# Patient Record
Sex: Female | Born: 1960 | Race: Black or African American | Hispanic: No | Marital: Single | Smoking: Current some day smoker
Health system: Southern US, Community
[De-identification: ages and names within clinical notes are randomized; demographics above are authoritative.]

## PROBLEM LIST (undated history)

## (undated) DIAGNOSIS — D649 Anemia, unspecified: Secondary | ICD-10-CM

## (undated) DIAGNOSIS — M1990 Unspecified osteoarthritis, unspecified site: Secondary | ICD-10-CM

## (undated) DIAGNOSIS — F32A Depression, unspecified: Secondary | ICD-10-CM

## (undated) DIAGNOSIS — J302 Other seasonal allergic rhinitis: Secondary | ICD-10-CM

## (undated) DIAGNOSIS — J449 Chronic obstructive pulmonary disease, unspecified: Secondary | ICD-10-CM

## (undated) DIAGNOSIS — D759 Disease of blood and blood-forming organs, unspecified: Secondary | ICD-10-CM

## (undated) DIAGNOSIS — F329 Major depressive disorder, single episode, unspecified: Secondary | ICD-10-CM

## (undated) DIAGNOSIS — D573 Sickle-cell trait: Secondary | ICD-10-CM

## (undated) DIAGNOSIS — K635 Polyp of colon: Secondary | ICD-10-CM

## (undated) DIAGNOSIS — D259 Leiomyoma of uterus, unspecified: Secondary | ICD-10-CM

## (undated) DIAGNOSIS — F419 Anxiety disorder, unspecified: Secondary | ICD-10-CM

## (undated) HISTORY — DX: Depression, unspecified: F32.A

## (undated) HISTORY — DX: Leiomyoma of uterus, unspecified: D25.9

## (undated) HISTORY — DX: Major depressive disorder, single episode, unspecified: F32.9

## (undated) HISTORY — DX: Polyp of colon: K63.5

## (undated) HISTORY — DX: Anxiety disorder, unspecified: F41.9

## (undated) HISTORY — DX: Chronic obstructive pulmonary disease, unspecified: J44.9

## (undated) HISTORY — DX: Sickle-cell trait: D57.3

## (undated) HISTORY — DX: Other seasonal allergic rhinitis: J30.2

---

## 2002-02-02 ENCOUNTER — Ambulatory Visit (HOSPITAL_COMMUNITY): Admission: RE | Admit: 2002-02-02 | Discharge: 2002-02-02 | Payer: Self-pay | Admitting: Family Medicine

## 2002-02-09 ENCOUNTER — Ambulatory Visit (HOSPITAL_COMMUNITY): Admission: RE | Admit: 2002-02-09 | Discharge: 2002-02-09 | Payer: Self-pay | Admitting: Family Medicine

## 2002-02-09 ENCOUNTER — Encounter: Payer: Self-pay | Admitting: Family Medicine

## 2002-03-26 ENCOUNTER — Encounter: Payer: Self-pay | Admitting: Emergency Medicine

## 2002-03-26 ENCOUNTER — Emergency Department (HOSPITAL_COMMUNITY): Admission: EM | Admit: 2002-03-26 | Discharge: 2002-03-26 | Payer: Self-pay | Admitting: Emergency Medicine

## 2003-03-17 ENCOUNTER — Ambulatory Visit (HOSPITAL_COMMUNITY): Admission: RE | Admit: 2003-03-17 | Discharge: 2003-03-17 | Payer: Self-pay | Admitting: Internal Medicine

## 2003-03-17 ENCOUNTER — Encounter: Payer: Self-pay | Admitting: Internal Medicine

## 2005-05-05 ENCOUNTER — Emergency Department (HOSPITAL_COMMUNITY): Admission: EM | Admit: 2005-05-05 | Discharge: 2005-05-05 | Payer: Self-pay | Admitting: Emergency Medicine

## 2005-05-07 ENCOUNTER — Ambulatory Visit: Payer: Self-pay | Admitting: *Deleted

## 2006-04-02 ENCOUNTER — Emergency Department (HOSPITAL_COMMUNITY): Admission: EM | Admit: 2006-04-02 | Discharge: 2006-04-02 | Payer: Self-pay | Admitting: Family Medicine

## 2006-04-08 ENCOUNTER — Ambulatory Visit: Payer: Self-pay | Admitting: Pulmonary Disease

## 2006-04-09 ENCOUNTER — Ambulatory Visit: Payer: Self-pay | Admitting: *Deleted

## 2006-04-14 ENCOUNTER — Encounter: Payer: Self-pay | Admitting: Pulmonary Disease

## 2006-04-29 ENCOUNTER — Ambulatory Visit: Payer: Self-pay | Admitting: Pulmonary Disease

## 2006-05-21 ENCOUNTER — Ambulatory Visit: Payer: Self-pay | Admitting: Internal Medicine

## 2006-05-28 ENCOUNTER — Ambulatory Visit: Payer: Self-pay | Admitting: Internal Medicine

## 2006-06-17 ENCOUNTER — Ambulatory Visit: Payer: Self-pay | Admitting: Internal Medicine

## 2006-06-26 ENCOUNTER — Encounter: Payer: Self-pay | Admitting: Pulmonary Disease

## 2006-07-03 ENCOUNTER — Ambulatory Visit: Payer: Self-pay | Admitting: Pulmonary Disease

## 2006-07-10 ENCOUNTER — Ambulatory Visit (HOSPITAL_COMMUNITY): Admission: RE | Admit: 2006-07-10 | Discharge: 2006-07-10 | Payer: Self-pay | Admitting: Internal Medicine

## 2006-07-10 ENCOUNTER — Encounter: Payer: Self-pay | Admitting: Internal Medicine

## 2006-07-15 ENCOUNTER — Ambulatory Visit: Payer: Self-pay | Admitting: Internal Medicine

## 2006-07-24 ENCOUNTER — Ambulatory Visit: Payer: Self-pay | Admitting: Internal Medicine

## 2006-12-06 ENCOUNTER — Emergency Department (HOSPITAL_COMMUNITY): Admission: EM | Admit: 2006-12-06 | Discharge: 2006-12-06 | Payer: Self-pay | Admitting: Emergency Medicine

## 2006-12-15 ENCOUNTER — Emergency Department (HOSPITAL_COMMUNITY): Admission: EM | Admit: 2006-12-15 | Discharge: 2006-12-15 | Payer: Self-pay | Admitting: Emergency Medicine

## 2007-03-10 ENCOUNTER — Encounter: Admission: RE | Admit: 2007-03-10 | Discharge: 2007-03-10 | Payer: Self-pay | Admitting: Internal Medicine

## 2007-04-27 ENCOUNTER — Ambulatory Visit: Payer: Self-pay | Admitting: Pulmonary Disease

## 2007-04-28 ENCOUNTER — Ambulatory Visit: Payer: Self-pay | Admitting: Cardiology

## 2007-04-28 ENCOUNTER — Ambulatory Visit: Payer: Self-pay | Admitting: Internal Medicine

## 2007-04-28 ENCOUNTER — Encounter: Payer: Self-pay | Admitting: Pulmonary Disease

## 2007-06-23 ENCOUNTER — Ambulatory Visit: Payer: Self-pay | Admitting: Pulmonary Disease

## 2007-06-29 ENCOUNTER — Ambulatory Visit: Payer: Self-pay | Admitting: Internal Medicine

## 2007-07-27 ENCOUNTER — Ambulatory Visit (HOSPITAL_COMMUNITY): Admission: RE | Admit: 2007-07-27 | Discharge: 2007-07-27 | Payer: Self-pay | Admitting: Internal Medicine

## 2007-07-31 ENCOUNTER — Encounter: Admission: RE | Admit: 2007-07-31 | Discharge: 2007-07-31 | Payer: Self-pay | Admitting: Internal Medicine

## 2007-10-06 ENCOUNTER — Encounter: Payer: Self-pay | Admitting: Pulmonary Disease

## 2008-04-05 DIAGNOSIS — J984 Other disorders of lung: Secondary | ICD-10-CM | POA: Insufficient documentation

## 2008-04-05 DIAGNOSIS — Z8719 Personal history of other diseases of the digestive system: Secondary | ICD-10-CM

## 2008-04-05 DIAGNOSIS — K219 Gastro-esophageal reflux disease without esophagitis: Secondary | ICD-10-CM | POA: Insufficient documentation

## 2008-04-05 DIAGNOSIS — K5730 Diverticulosis of large intestine without perforation or abscess without bleeding: Secondary | ICD-10-CM | POA: Insufficient documentation

## 2008-04-05 DIAGNOSIS — K589 Irritable bowel syndrome without diarrhea: Secondary | ICD-10-CM

## 2008-04-05 DIAGNOSIS — F329 Major depressive disorder, single episode, unspecified: Secondary | ICD-10-CM | POA: Insufficient documentation

## 2008-07-26 ENCOUNTER — Emergency Department (HOSPITAL_COMMUNITY): Admission: EM | Admit: 2008-07-26 | Discharge: 2008-07-26 | Payer: Self-pay | Admitting: Emergency Medicine

## 2008-08-09 ENCOUNTER — Encounter: Admission: RE | Admit: 2008-08-09 | Discharge: 2008-08-09 | Payer: Self-pay | Admitting: Internal Medicine

## 2008-10-07 ENCOUNTER — Encounter: Payer: Self-pay | Admitting: Pulmonary Disease

## 2008-12-01 ENCOUNTER — Ambulatory Visit: Payer: Self-pay | Admitting: Pulmonary Disease

## 2008-12-01 DIAGNOSIS — J449 Chronic obstructive pulmonary disease, unspecified: Secondary | ICD-10-CM

## 2008-12-01 DIAGNOSIS — J439 Emphysema, unspecified: Secondary | ICD-10-CM | POA: Insufficient documentation

## 2008-12-12 ENCOUNTER — Telehealth (INDEPENDENT_AMBULATORY_CARE_PROVIDER_SITE_OTHER): Payer: Self-pay | Admitting: *Deleted

## 2009-01-24 ENCOUNTER — Telehealth (INDEPENDENT_AMBULATORY_CARE_PROVIDER_SITE_OTHER): Payer: Self-pay | Admitting: *Deleted

## 2009-02-19 ENCOUNTER — Emergency Department (HOSPITAL_COMMUNITY): Admission: EM | Admit: 2009-02-19 | Discharge: 2009-02-19 | Payer: Self-pay | Admitting: Emergency Medicine

## 2009-09-26 ENCOUNTER — Encounter: Admission: RE | Admit: 2009-09-26 | Discharge: 2009-09-26 | Payer: Self-pay | Admitting: Internal Medicine

## 2010-05-25 ENCOUNTER — Encounter: Admission: RE | Admit: 2010-05-25 | Discharge: 2010-05-25 | Payer: Self-pay | Admitting: Internal Medicine

## 2010-09-28 ENCOUNTER — Encounter: Admission: RE | Admit: 2010-09-28 | Discharge: 2010-09-28 | Payer: Self-pay | Admitting: Internal Medicine

## 2010-10-17 ENCOUNTER — Encounter: Admission: RE | Admit: 2010-10-17 | Discharge: 2010-10-17 | Payer: Self-pay | Admitting: Internal Medicine

## 2011-03-28 LAB — BASIC METABOLIC PANEL
CO2: 26 mEq/L (ref 19–32)
GFR calc Af Amer: >60 mL/min (ref >60)
GFR calc non Af Amer: >60 mL/min (ref >60)
Glucose, Bld: 108 mg/dL — ABNORMAL HIGH (ref 70–99)
Potassium: 4.2 mEq/L (ref 3.5–5.1)
Sodium: 138 mEq/L (ref 135–145)

## 2011-03-28 LAB — DIFFERENTIAL
Lymphocytes Relative: 42 % (ref 12–46)
Lymphs Abs: 3.3 10*3/uL (ref 0.7–4.0)
Monocytes Absolute: 0.4 10*3/uL (ref 0.1–1.0)
Neutro Abs: 4 10*3/uL (ref 1.7–7.7)
Neutrophils Relative %: 51 % (ref 43–77)

## 2011-03-28 LAB — CBC
MCV: 84.5 fL (ref 78.0–100.0)
Platelets: 313 10*3/uL (ref 150–400)
RDW: 15.4 % (ref 11.5–15.5)

## 2011-04-30 NOTE — Assessment & Plan Note (Signed)
[facility]                         GASTROENTEROLOGY OFFICE NOTE   NAME:Saindon, ERCIE ELIASEN                       MRN:          [id number]  DATE:04/28/2007                            DOB:          1961/11/06    REFERRING PHYSICIAN:  Ralene Ok, M.D.   REASON FOR CONSULTATION:  Abdominal pain.   HISTORY OF PRESENT ILLNESS:  This is a 50 year old African-American  female with history of depression, drug and alcohol abuse, chronic  tobacco abuse with chronic obstructive pulmonary disease, asthma,  gastroesophageal reflux disease, chronic anemia, hyperlipidemia,  constipation predominant irritable bowel syndrome, and diverticulosis.  The patient was evaluated in July 2007 for rectal bleeding.  She  subsequently underwent complete colonoscopy July 10, 2006.  This  revealed moderately severe left-sided diverticulosis, no other  abnormalities.  Because of family history of colon cancer in a parent,  followup in five years recommended.  The patient has had multiple  complaints since that time.  Her care has been transferred from Dr.  Oliver Barre to Dr. Ludwig Clarks.  I have no referral letter or records, but  pulled some information from the electronic medical record which shows  that the patient was in the emergency room December 15, 2006 with  complaints of abdominal pain.  The workup at that time was unremarkable  including urinalysis, CBC, comprehensive metabolic panel, serum lipase,  and plain abdominal films.  She also was sent for an abdominal  ultrasound March 10, 2007 to evaluate abdominal pain and tenderness.  This was normal except for a stable hemangioma of the right lobe of the  liver.  The patient apparently saw Dr. Ludwig Clarks recently (I spoke to him  on the telephone this morning to discuss directly) and was complaining  of epigastric pain.  She has been on Prevacid.  She states she has had  the complaints for over a year, also generalized abdominal  pain and  bloating as previously documented.  She complains of constipation but  also diarrhea and bright red blood per rectum.  She has occasional  nausea, no vomiting.  Most impressive today is the fact that the patient  is crying relentlessly.  It is very difficult for her to provide a  meaningful history.  She just states, I just don't feel good.  I have a  lot on my mind.  On further questioning the patient states that she  feels cold periodically.  Also states that she is having some type of  lung surgery at [facility] next month which makes her anxious and worried.  Further questioning reveals that she is seen by mental health and is  supposed to have an appointment this week.  She is on psychotropic  medications.   ALLERGIES:  No known drug allergies.   CURRENT MEDICATIONS:  1. Alendronate 70 mg once weekly.  2. Bupropion 100 mg daily.  3. Trazodone 50 mg at night.  4. Prevacid 30 mg daily.  5. Beano.  6. Chantix.  7. Lorazepam 0.5 mg at night.  8. Naphcon eye drops.  9. Spiriva inhaler.  10.Fluoxetine 20 mg daily.  11.Multivitamin.  FAMILY HISTORY:  Father with colon cancer deceased at age 8.   SOCIAL HISTORY:  The patient is single with one son.  She lives alone.  She finished high school.  She smokes.  Uses alcohol.  History of  illicit drug use but not recently.   PHYSICAL EXAMINATION:  GENERAL:  Tearful, depressed-appearing female who  looks older than her stated age.  She is alert and oriented.  She is in  no acute distress.  VITAL SIGNS:  Blood pressure 112/68, heart rate 76, weight 149.6 pounds  (increased 12 pounds since last visit).  HEENT:  Sclerae are muddy but anicteric.  Conjunctivae are pink.  Oral  mucosa intact.  LUNGS:  Clear.  HEART:  Regular.  ABDOMEN:  Slightly obese, slightly distended without mass or hernia.  The patient complains of tenderness with minimal palpation in any  portion of her abdomen.  EXTREMITIES:  Without edema.    IMPRESSION:  1. Chronic abdominal complaints really unchanged from year previous.      I suspect irritable bowel.  Symptoms significantly affected by      anxiety and depression. Her abdominal exam is not worrisome.  2. Intermittent rectal bleeding due to known hemorrhoids.  3. Diverticulosis.  4. Gastroesophageal reflux disease on Prevacid.  5. Depression.Active.  6. Lung disease.  7. Substance abuse.   RECOMMENDATIONS:  1. Continue proton pump inhibitor.  2. CT scan of the abdomen and pelvis to screen for significant      problems. As it is very difficult to assess this patient due to her      entirely positive Review of Systems, the chronic nature of her      complaints, and her psychiatric overlay.  3. If CT negative, return to Dr. Ludwig Clarks this week to address      nongastrointestinal complaints such as breathing difficulties,      depression, feeling of coldness, etc.  Also return to mental health      this week to deal with active depression.  Again, I spoke with Dr.      Ludwig Clarks in this regard.  Sixty minutes was spent with this patient.    Wilhemina Bonito. Marina Goodell, MD  Electronically Signed   JNP/MedQ  DD: 04/28/2007  DT: 04/28/2007  Job #: [id number]   cc:   Ralene Ok, M.D.

## 2011-04-30 NOTE — Assessment & Plan Note (Signed)
[facility]                         GASTROENTEROLOGY OFFICE NOTE   NAME:[name], [name] WELLE                       MRN:          [id number]  DATE:06/29/2007                            DOB:          31-Jan-1961    HISTORY:  [name] [name] presents today for followup. She is a 50 year old  with advanced oxygen requiring COPD, asthma, chronic anemia,  gastroesophageal reflux disease, hyperlipidemia, depression, history of  drug and alcohol abuse, diverticulosis, irritable bowel syndrome. She  was evaluated in the office on Apr 28, 2007 with multiple complaints  including abdominal pain. See that dictation for details. At that time,  she was depressed and had concerns regarding her lung disease. A CT scan  of the abdomen and pelvis was performed that day and returned negative.  She continues on Prevacid for her reflux. She has visited with the  mental health folks since her last office visit and reports doing better  with less depression. She has also been seen at [facility] and is now on  chronic oxygen therapy. No new problems.   CHIEF COMPLAINTS:  Are that of bloating, occasional loose stools as well  as some intermittent abdominal discomfort. No nausea, vomiting or weight  loss.   CURRENT MEDICATIONS:  1. Lorazepam 0.5 mg at night.  2. Prevacid 30 mg daily.  3. Fluoxetine 20 mg daily.  4. Bupropion 100 mg daily.  5. Spiriva once daily.  6. Home oxygen therapy.  7. Alendronate 70 mg weekly.   PHYSICAL EXAMINATION:  Chronically  ill-appearing female in no acute  distress. She is alert and oriented. Blood pressure is 108/64, heart  rate 72, weight is 155.6 pounds.  LUNGS:  Reveal distant breath sounds.  HEART: Regular.  ABDOMEN: Obese without tenderness or mass. Good bowel sounds heard.   IMPRESSION:  1. Irritable bowel syndrome.  2. Diverticulosis.  3. Gastroesophageal reflux disease.  4. Chronic abdominal complaints related to irritable bowel.  5.  Problems with bloating likely exacerbated by lung disease.  6. Multiple general medical problems including advanced lung disease.   RECOMMENDATIONS:  1. Continue Prevacid.  2. Followup colonoscopy due in July 2012 if medically fit.  3. Resume general medical care with Dr.  Ludwig Clarks.     Wilhemina Bonito. Marina Goodell, MD  Electronically Signed    JNP/MedQ  DD: 06/29/2007  DT: 06/29/2007  Job #: [id number]   cc:   Ralene Ok, M.D.

## 2011-07-20 ENCOUNTER — Encounter: Payer: Self-pay | Admitting: Internal Medicine

## 2011-09-30 ENCOUNTER — Encounter: Payer: Self-pay | Admitting: Internal Medicine

## 2011-09-30 ENCOUNTER — Other Ambulatory Visit (HOSPITAL_COMMUNITY): Payer: Self-pay | Admitting: Internal Medicine

## 2011-09-30 DIAGNOSIS — Z1231 Encounter for screening mammogram for malignant neoplasm of breast: Secondary | ICD-10-CM

## 2011-10-08 ENCOUNTER — Ambulatory Visit (AMBULATORY_SURGERY_CENTER): Payer: PRIVATE HEALTH INSURANCE | Admitting: *Deleted

## 2011-10-08 VITALS — Ht 64.5 in | Wt 156.9 lb

## 2011-10-08 DIAGNOSIS — Z1211 Encounter for screening for malignant neoplasm of colon: Secondary | ICD-10-CM

## 2011-10-08 MED ORDER — PEG-KCL-NACL-NASULF-NA ASC-C 100 G PO SOLR: ORAL | Status: DC

## 2011-10-15 ENCOUNTER — Ambulatory Visit (HOSPITAL_COMMUNITY): Payer: PRIVATE HEALTH INSURANCE

## 2011-10-18 ENCOUNTER — Telehealth: Payer: Self-pay | Admitting: Internal Medicine

## 2011-10-18 NOTE — Telephone Encounter (Signed)
No answer, left message.   [name] [name] e

## 2011-10-21 ENCOUNTER — Telehealth: Payer: Self-pay | Admitting: Internal Medicine

## 2011-10-21 DIAGNOSIS — Z1211 Encounter for screening for malignant neoplasm of colon: Secondary | ICD-10-CM

## 2011-10-21 MED ORDER — PEG-KCL-NACL-NASULF-NA ASC-C 100 G PO SOLR: ORAL | Status: DC

## 2011-10-21 NOTE — Telephone Encounter (Signed)
Pt states that she drank 1st dose of Moviprep on Friday.  She needs another A and B packet.  New rx of Moviprep sent to her pharmacy; pharmacist had stated she would need a brand new rx. Pt notified and understanding voiced

## 2011-10-22 ENCOUNTER — Ambulatory Visit (AMBULATORY_SURGERY_CENTER): Payer: PRIVATE HEALTH INSURANCE | Admitting: Internal Medicine

## 2011-10-22 ENCOUNTER — Encounter: Payer: Self-pay | Admitting: Internal Medicine

## 2011-10-22 VITALS — BP 125/76 | HR 90 | Temp 97.1°F | Resp 20 | Ht 64.5 in | Wt 156.0 lb

## 2011-10-22 DIAGNOSIS — K5730 Diverticulosis of large intestine without perforation or abscess without bleeding: Secondary | ICD-10-CM

## 2011-10-22 DIAGNOSIS — Z800 Family history of malignant neoplasm of digestive organs: Secondary | ICD-10-CM

## 2011-10-22 DIAGNOSIS — D126 Benign neoplasm of colon, unspecified: Secondary | ICD-10-CM

## 2011-10-22 DIAGNOSIS — Z1211 Encounter for screening for malignant neoplasm of colon: Secondary | ICD-10-CM

## 2011-10-22 MED ORDER — SODIUM CHLORIDE 0.9 % IV SOLN: 500.0000 mL | INTRAVENOUS | Status: DC

## 2011-10-22 NOTE — Progress Notes (Signed)
Pt states that she is on home oxygen at 2 liters.

## 2011-10-22 NOTE — Patient Instructions (Signed)
2 POLYPS, DIVERTICULOSIS  SEE GREEN AND BLUE SHEETS FOR ADDITIONAL D/C INSTRUCTIONS  RECALL COLONOSCOPY IN 5 YEARS

## 2011-10-23 ENCOUNTER — Telehealth: Payer: Self-pay

## 2011-10-23 NOTE — Telephone Encounter (Signed)
Short and uncomplicated procedure. See her PCP for Hip pain

## 2011-10-23 NOTE — Telephone Encounter (Signed)
11:10 phone call to the pt.  She has been laying down and not in pain at this time.  Per Dr.Perry short and uncomplicated procedure.  Pt to see PCP for hip pain.  maw

## 2011-10-23 NOTE — Telephone Encounter (Signed)
Follow up Call- Patient questions:  Do you have a fever, pain , or abdominal swelling? yes Pain Score  9 *  Have you tolerated food without any problems? yes  Have you been able to return to your normal activities? no  Do you have any questions about your discharge instructions: Diet   no Medications  no Follow up visit  no  Do you have questions or concerns about your Care? yes  Pt complaint of left hip pain rating the pain 8-9 out of 10 on the pain scale.  She said it is a sharpe pain when she bends over.  Pt feels the pain is from "the way I was laying".  Pt has no prior hx of left hip or back pain per the pt.  She had CYCLOBENZAPR (flexeril) at home that was ordered by Dr. Clista Bernhardt, her medical MD prior and took that this AM around 07:30 am.  I advised her that I would make Dr. Marina Goodell aware of this and call her back with any further recommendations.  She said she understood. MAW   * If pain score is 4 or above: No action needed, pain <4.

## 2012-01-23 ENCOUNTER — Ambulatory Visit
Admission: RE | Admit: 2012-01-23 | Discharge: 2012-01-23 | Disposition: A | Discharge status: Home / Self Care | Payer: PRIVATE HEALTH INSURANCE | Source: Ambulatory Visit | Attending: *Deleted | Admitting: *Deleted

## 2012-01-23 ENCOUNTER — Other Ambulatory Visit: Payer: Self-pay | Admitting: *Deleted

## 2012-01-23 DIAGNOSIS — J449 Chronic obstructive pulmonary disease, unspecified: Secondary | ICD-10-CM

## 2012-03-31 ENCOUNTER — Other Ambulatory Visit: Payer: Self-pay | Admitting: Surgery

## 2012-04-09 ENCOUNTER — Ambulatory Visit: Payer: PRIVATE HEALTH INSURANCE

## 2012-05-07 ENCOUNTER — Ambulatory Visit: Payer: PRIVATE HEALTH INSURANCE

## 2012-05-15 ENCOUNTER — Ambulatory Visit
Admission: RE | Admit: 2012-05-15 | Discharge: 2012-05-15 | Disposition: A | Discharge status: Home / Self Care | Payer: PRIVATE HEALTH INSURANCE | Source: Ambulatory Visit | Attending: Internal Medicine | Admitting: Internal Medicine

## 2012-05-15 DIAGNOSIS — Z1231 Encounter for screening mammogram for malignant neoplasm of breast: Secondary | ICD-10-CM

## 2012-07-29 ENCOUNTER — Other Ambulatory Visit (HOSPITAL_COMMUNITY)
Admission: RE | Admit: 2012-07-29 | Discharge: 2012-07-29 | Disposition: A | Discharge status: Home / Self Care | Payer: PRIVATE HEALTH INSURANCE | Source: Ambulatory Visit | Attending: Obstetrics and Gynecology | Admitting: Obstetrics and Gynecology

## 2012-07-29 ENCOUNTER — Other Ambulatory Visit: Payer: Self-pay | Admitting: Obstetrics and Gynecology

## 2012-07-29 DIAGNOSIS — Z1151 Encounter for screening for human papillomavirus (HPV): Secondary | ICD-10-CM | POA: Insufficient documentation

## 2012-07-29 DIAGNOSIS — Z124 Encounter for screening for malignant neoplasm of cervix: Secondary | ICD-10-CM | POA: Insufficient documentation

## 2012-10-15 ENCOUNTER — Other Ambulatory Visit: Payer: Self-pay | Admitting: Internal Medicine

## 2012-10-15 DIAGNOSIS — R109 Unspecified abdominal pain: Secondary | ICD-10-CM

## 2012-10-22 ENCOUNTER — Ambulatory Visit
Admission: RE | Admit: 2012-10-22 | Discharge: 2012-10-22 | Disposition: A | Discharge status: Home / Self Care | Payer: PRIVATE HEALTH INSURANCE | Source: Ambulatory Visit | Attending: Internal Medicine | Admitting: Internal Medicine

## 2012-10-22 DIAGNOSIS — R109 Unspecified abdominal pain: Secondary | ICD-10-CM

## 2013-07-02 ENCOUNTER — Other Ambulatory Visit: Payer: Self-pay | Admitting: Internal Medicine

## 2013-07-02 DIAGNOSIS — Z1231 Encounter for screening mammogram for malignant neoplasm of breast: Secondary | ICD-10-CM

## 2013-07-22 ENCOUNTER — Ambulatory Visit
Admission: RE | Admit: 2013-07-22 | Discharge: 2013-07-22 | Disposition: A | Discharge status: Home / Self Care | Payer: PRIVATE HEALTH INSURANCE | Source: Ambulatory Visit | Attending: Internal Medicine | Admitting: Internal Medicine

## 2013-07-22 DIAGNOSIS — Z1231 Encounter for screening mammogram for malignant neoplasm of breast: Secondary | ICD-10-CM

## 2013-11-30 ENCOUNTER — Other Ambulatory Visit: Payer: Self-pay | Admitting: Internal Medicine

## 2013-11-30 DIAGNOSIS — M810 Age-related osteoporosis without current pathological fracture: Secondary | ICD-10-CM

## 2013-11-30 DIAGNOSIS — N838 Other noninflammatory disorders of ovary, fallopian tube and broad ligament: Secondary | ICD-10-CM

## 2013-12-06 ENCOUNTER — Other Ambulatory Visit: Payer: PRIVATE HEALTH INSURANCE

## 2013-12-14 ENCOUNTER — Ambulatory Visit
Admission: RE | Admit: 2013-12-14 | Discharge: 2013-12-14 | Disposition: A | Discharge status: Home / Self Care | Payer: PRIVATE HEALTH INSURANCE | Source: Ambulatory Visit | Attending: Internal Medicine | Admitting: Internal Medicine

## 2013-12-14 DIAGNOSIS — N838 Other noninflammatory disorders of ovary, fallopian tube and broad ligament: Secondary | ICD-10-CM

## 2013-12-29 ENCOUNTER — Other Ambulatory Visit: Payer: PRIVATE HEALTH INSURANCE

## 2014-01-19 ENCOUNTER — Other Ambulatory Visit: Payer: PRIVATE HEALTH INSURANCE

## 2014-01-24 ENCOUNTER — Ambulatory Visit
Admission: RE | Admit: 2014-01-24 | Discharge: 2014-01-24 | Disposition: A | Discharge status: Home / Self Care | Payer: PRIVATE HEALTH INSURANCE | Source: Ambulatory Visit | Attending: Internal Medicine | Admitting: Internal Medicine

## 2014-01-24 DIAGNOSIS — M810 Age-related osteoporosis without current pathological fracture: Secondary | ICD-10-CM

## 2014-05-04 ENCOUNTER — Other Ambulatory Visit: Payer: Self-pay | Admitting: *Deleted

## 2014-05-04 ENCOUNTER — Other Ambulatory Visit: Payer: Self-pay | Admitting: Internal Medicine

## 2014-05-04 DIAGNOSIS — M79606 Pain in leg, unspecified: Secondary | ICD-10-CM

## 2014-05-06 ENCOUNTER — Ambulatory Visit
Admission: RE | Admit: 2014-05-06 | Discharge: 2014-05-06 | Disposition: A | Discharge status: Home / Self Care | Payer: PRIVATE HEALTH INSURANCE | Source: Ambulatory Visit | Attending: Internal Medicine | Admitting: Internal Medicine

## 2014-05-06 DIAGNOSIS — M79606 Pain in leg, unspecified: Secondary | ICD-10-CM

## 2014-06-07 ENCOUNTER — Emergency Department (HOSPITAL_COMMUNITY)
Admission: EM | Admit: 2014-06-07 | Discharge: 2014-06-07 | Disposition: A | Discharge status: Home / Self Care | Payer: PRIVATE HEALTH INSURANCE | Attending: Emergency Medicine | Admitting: Emergency Medicine

## 2014-06-07 ENCOUNTER — Encounter (HOSPITAL_COMMUNITY): Payer: Self-pay | Admitting: Emergency Medicine

## 2014-06-07 ENCOUNTER — Emergency Department (HOSPITAL_COMMUNITY): Payer: PRIVATE HEALTH INSURANCE

## 2014-06-07 DIAGNOSIS — J3489 Other specified disorders of nose and nasal sinuses: Secondary | ICD-10-CM | POA: Diagnosis not present

## 2014-06-07 DIAGNOSIS — F411 Generalized anxiety disorder: Secondary | ICD-10-CM | POA: Insufficient documentation

## 2014-06-07 DIAGNOSIS — J441 Chronic obstructive pulmonary disease with (acute) exacerbation: Secondary | ICD-10-CM | POA: Diagnosis not present

## 2014-06-07 DIAGNOSIS — Z79899 Other long term (current) drug therapy: Secondary | ICD-10-CM | POA: Diagnosis not present

## 2014-06-07 DIAGNOSIS — M810 Age-related osteoporosis without current pathological fracture: Secondary | ICD-10-CM | POA: Insufficient documentation

## 2014-06-07 DIAGNOSIS — Z862 Personal history of diseases of the blood and blood-forming organs and certain disorders involving the immune mechanism: Secondary | ICD-10-CM | POA: Diagnosis not present

## 2014-06-07 DIAGNOSIS — R42 Dizziness and giddiness: Secondary | ICD-10-CM | POA: Diagnosis present

## 2014-06-07 DIAGNOSIS — F329 Major depressive disorder, single episode, unspecified: Secondary | ICD-10-CM | POA: Insufficient documentation

## 2014-06-07 DIAGNOSIS — F3289 Other specified depressive episodes: Secondary | ICD-10-CM | POA: Insufficient documentation

## 2014-06-07 DIAGNOSIS — F17200 Nicotine dependence, unspecified, uncomplicated: Secondary | ICD-10-CM | POA: Insufficient documentation

## 2014-06-07 DIAGNOSIS — R1013 Epigastric pain: Secondary | ICD-10-CM | POA: Diagnosis not present

## 2014-06-07 DIAGNOSIS — R0981 Nasal congestion: Secondary | ICD-10-CM

## 2014-06-07 LAB — CBC WITH DIFFERENTIAL/PLATELET
BASOS PCT: 0 % (ref 0–1)
Basophils Absolute: 0 10*3/uL (ref 0.0–0.1)
EOS ABS: 0.2 10*3/uL (ref 0.0–0.7)
EOS PCT: 3 % (ref 0–5)
HCT: 43.1 % (ref 36.0–46.0)
HEMOGLOBIN: 15 g/dL (ref 12.0–15.0)
LYMPHS PCT: 36 % (ref 12–46)
Lymphs Abs: 1.8 10*3/uL (ref 0.7–4.0)
MCH: 29.1 pg (ref 26.0–34.0)
MCHC: 34.8 g/dL (ref 30.0–36.0)
MCV: 83.7 fL (ref 78.0–100.0)
MONO ABS: 0.3 10*3/uL (ref 0.1–1.0)
Monocytes Relative: 6 % (ref 3–12)
NEUTROS PCT: 55 % (ref 43–77)
Neutro Abs: 2.7 10*3/uL (ref 1.7–7.7)
PLATELETS: UNDETERMINED 10*3/uL (ref 150–400)
RBC: 5.15 MIL/uL — AB (ref 3.87–5.11)
RDW: 14.1 % (ref 11.5–15.5)
WBC: 5 10*3/uL (ref 4.0–10.5)

## 2014-06-07 LAB — URINALYSIS, ROUTINE W REFLEX MICROSCOPIC
BILIRUBIN URINE: NEGATIVE
GLUCOSE, UA: NEGATIVE mg/dL
HGB URINE DIPSTICK: NEGATIVE
KETONES UR: NEGATIVE mg/dL
Leukocytes, UA: NEGATIVE
Nitrite: NEGATIVE
PROTEIN: NEGATIVE mg/dL
Specific Gravity, Urine: 1.015 (ref 1.005–1.030)
UROBILINOGEN UA: 0.2 mg/dL (ref 0.0–1.0)
pH: 5.5 (ref 5.0–8.0)

## 2014-06-07 LAB — COMPREHENSIVE METABOLIC PANEL
ALBUMIN: 3.9 g/dL (ref 3.5–5.2)
ALT: 16 U/L (ref 0–35)
AST: 18 U/L (ref 0–37)
Alkaline Phosphatase: 58 U/L (ref 39–117)
BILIRUBIN TOTAL: 0.6 mg/dL (ref 0.3–1.2)
BUN: 13 mg/dL (ref 6–23)
CHLORIDE: 106 meq/L (ref 96–112)
CO2: 27 mEq/L (ref 19–32)
CREATININE: 0.81 mg/dL (ref 0.50–1.10)
Calcium: 9.5 mg/dL (ref 8.4–10.5)
GFR calc Af Amer: >90 mL/min (ref >90)
GFR calc non Af Amer: 80 mL/min — ABNORMAL LOW (ref >90)
Glucose, Bld: 93 mg/dL (ref 70–99)
Potassium: 4.3 mEq/L (ref 3.7–5.3)
Sodium: 144 mEq/L (ref 137–147)
TOTAL PROTEIN: 7.9 g/dL (ref 6.0–8.3)

## 2014-06-07 LAB — I-STAT TROPONIN, ED: TROPONIN I, POC: 0 ng/mL (ref 0.00–0.08)

## 2014-06-07 LAB — LIPASE, BLOOD: LIPASE: 27 U/L (ref 11–59)

## 2014-06-07 LAB — I-STAT CHEM 8, ED
BUN: 14 mg/dL (ref 6–23)
CREATININE: 0.9 mg/dL (ref 0.50–1.10)
Calcium, Ion: 1.28 mmol/L — ABNORMAL HIGH (ref 1.12–1.23)
Chloride: 103 mEq/L (ref 96–112)
GLUCOSE: 88 mg/dL (ref 70–99)
HCT: 48 % — ABNORMAL HIGH (ref 36.0–46.0)
HEMOGLOBIN: 16.3 g/dL — AB (ref 12.0–15.0)
Potassium: 4 mEq/L (ref 3.7–5.3)
SODIUM: 144 meq/L (ref 137–147)
TCO2: 26 mmol/L (ref 0–100)

## 2014-06-07 MED ORDER — DIAZEPAM 5 MG/ML IJ SOLN
5.0000 mg | Freq: Once | INTRAMUSCULAR | Status: AC
  Administered 2014-06-07: 5 mg via INTRAVENOUS

## 2014-06-07 MED ORDER — MECLIZINE HCL 25 MG PO TABS
50.0000 mg | ORAL_TABLET | Freq: Once | ORAL | Status: AC
  Administered 2014-06-07: 50 mg via ORAL
  Filled 2014-06-07: qty 2

## 2014-06-07 MED ORDER — DIAZEPAM 5 MG/ML IJ SOLN
5.0000 mg | Freq: Once | INTRAMUSCULAR | Status: DC
  Filled 2014-06-07: qty 2

## 2014-06-07 MED ORDER — ALBUTEROL SULFATE (2.5 MG/3ML) 0.083% IN NEBU
5.0000 mg | INHALATION_SOLUTION | Freq: Once | RESPIRATORY_TRACT | Status: AC
  Administered 2014-06-07: 5 mg via RESPIRATORY_TRACT
  Filled 2014-06-07: qty 6

## 2014-06-07 MED ORDER — MECLIZINE HCL 25 MG PO TABS
25.0000 mg | ORAL_TABLET | Freq: Three times a day (TID) | ORAL | Status: DC | PRN
Start: 2014-06-07 — End: 2016-02-06

## 2014-06-07 MED ORDER — PSEUDOEPHEDRINE HCL 30 MG PO TABS: 30.0000 mg | ORAL_TABLET | ORAL | Status: DC | PRN

## 2014-06-07 MED ORDER — GI COCKTAIL ~~LOC~~
30.0000 mL | Freq: Once | ORAL | Status: AC
  Administered 2014-06-07: 30 mL via ORAL
  Filled 2014-06-07: qty 30

## 2014-06-07 MED ORDER — SODIUM CHLORIDE 0.9 % IV BOLUS (SEPSIS)
1000.0000 mL | Freq: Once | INTRAVENOUS | Status: AC
  Administered 2014-06-07: 1000 mL via INTRAVENOUS

## 2014-06-07 NOTE — ED Provider Notes (Signed)
Medical screening examination/treatment/procedure(s) were performed by non-physician practitioner and as supervising physician I was immediately available for consultation/collaboration.    Dot Lanes, MD 06/07/14 [phone]

## 2014-06-07 NOTE — ED Notes (Signed)
Pt c/o dizziness since last night around 930 when she got up to cook chicken. Pt is having SOB and is on 2L O2 at home. O2 97% 2L.

## 2014-06-07 NOTE — ED Notes (Signed)
Pt taken to the bathroom. No complaints at this time.

## 2014-06-07 NOTE — ED Provider Notes (Signed)
CSN: [id number]     Arrival date & time 06/07/14  1751 History   First MD Initiated Contact with Patient 06/07/14 [phone]     Chief Complaint  Patient presents with  . Dizziness  . Shortness of Breath     (Consider location/radiation/quality/duration/timing/severity/associated sxs/prior Treatment) HPI [name] [name] is a 53 y.o. female who presents to ED with multiple complaints. Pt reports weakness, dizziness, congestion, sore throat, abdominal pain, cough. States most symptoms began yesterday. States dizziness feels more like "light headed." Denies spinning sensation but states it is worsened with head movement and position changes. States cough and shorness of breath is chronic, states "its my COPD." Repots also abdominal swelling, nausea, no vomiting. Normal BM this morning. Denies taking any medications for her symptoms. States "I just feel weak and I feel like my eyeballs are rolling to the back of my head."   Past Medical History  Diagnosis Date  . Seasonal allergies   . Anxiety   . Depression   . COPD (chronic obstructive pulmonary disease)   . Osteoporosis   . Sickle cell trait    Past Surgical History  Procedure Laterality Date  . Cesarean section  1980   Family History  Problem Relation Age of Onset  . Colon cancer Father 52  . Stomach cancer Neg Hx    History  Substance Use Topics  . Smoking status: Current Every Day Smoker -- 1.00 packs/day    Types: Cigarettes  . Smokeless tobacco: Not on file  . Alcohol Use: Yes     Comment: occasional   OB History   Grav Para Term Preterm Abortions TAB SAB Ect Mult Living                 Review of Systems  Constitutional: Positive for fatigue. Negative for fever and chills.  Respiratory: Positive for cough and shortness of breath. Negative for chest tightness.   Cardiovascular: Negative for chest pain, palpitations and leg swelling.  Gastrointestinal: Positive for nausea and abdominal pain. Negative for vomiting and  diarrhea.  Genitourinary: Negative for dysuria, flank pain and pelvic pain.  Musculoskeletal: Negative for arthralgias, myalgias, neck pain and neck stiffness.  Skin: Negative for rash.  Neurological: Positive for dizziness and light-headedness. Negative for syncope, speech difficulty, weakness, numbness and headaches.  All other systems reviewed and are negative.     Allergies  Review of patient's allergies indicates no known allergies.  Home Medications   Prior to Admission medications   Medication Sig Start Date End Date Taking? Authorizing Ilynn Stauffer  buPROPion (WELLBUTRIN SR) 100 MG 12 hr tablet Take 100 mg by mouth 2 (two) times daily.      Historical Fred Hammes, MD  cyclobenzaprine (FLEXERIL) 5 MG tablet Take 5 mg by mouth as needed.      Historical Gerald Honea, MD  Fexofenadine HCl (ALLEGRA PO) Take by mouth every 6 (six) hours as needed.      Historical Rolfe Hartsell, MD  LORazepam (ATIVAN) 1 MG tablet Take 1 mg by mouth at bedtime.      Historical Kerston Landeck, MD  Multiple Vitamin (MULTIVITAMIN) tablet Take 1 tablet by mouth daily.      Historical Kellee Sittner, MD  raloxifene (EVISTA) 60 MG tablet Take 60 mg by mouth daily.      Historical Assunta Pupo, MD  tiotropium (SPIRIVA) 18 MCG inhalation capsule Place 18 mcg into inhaler and inhale daily.      Historical Joelle Roswell, MD   BP 130/89  Pulse 86  Temp(Src) 97.9 F (36.6  C) (Oral)  Resp 22  Ht 5\' 4"  (1.626 m)  Wt 138 lb (62.596 kg)  BMI 23.68 kg/m2  SpO2 98% Physical Exam  Nursing note and vitals reviewed. Constitutional: She is oriented to person, place, and time. She appears well-developed and well-nourished. No distress.  HENT:  Head: Normocephalic.  Nose: Nose normal.  Mouth/Throat: Oropharynx is clear and moist.  Nasal congestion.   Eyes: Conjunctivae and EOM are normal. Pupils are equal, round, and reactive to light.  Horizontal nystagmus present.   Neck: Neck supple.  Cardiovascular: Normal rate, regular rhythm and normal heart  sounds.   Pulmonary/Chest: Effort normal. No respiratory distress. She has wheezes. She has no rales.  End expiratory wheezes in both lung fields  Abdominal: Soft. Bowel sounds are normal. She exhibits no distension. There is no tenderness. There is no rebound.  Musculoskeletal: She exhibits no edema.  Neurological: She is alert and oriented to person, place, and time.  Skin: Skin is warm and dry.  Psychiatric: She has a normal mood and affect. Her behavior is normal.    ED Course  Procedures (including critical care time) Labs Review Labs Reviewed  CBC WITH DIFFERENTIAL - Abnormal; Notable for the following:    RBC 5.15 (*)    All other components within normal limits  COMPREHENSIVE METABOLIC PANEL - Abnormal; Notable for the following:    GFR calc non Af Amer 80 (*)    All other components within normal limits  I-STAT CHEM 8, ED - Abnormal; Notable for the following:    Calcium, Ion 1.28 (*)    Hemoglobin 16.3 (*)    HCT 48.0 (*)    All other components within normal limits  URINE CULTURE  LIPASE, BLOOD  URINALYSIS, ROUTINE W REFLEX MICROSCOPIC  I-STAT TROPOININ, ED    Imaging Review No results found.   EKG Interpretation   Date/Time:  Tuesday June 07 2014 07:51:49 EDT Ventricular Rate:  80 PR Interval:  162 QRS Duration: 97 QT Interval:  394 QTC Calculation: 454 R Axis:   74 Text Interpretation:  Sinus rhythm Anteroseptal infarct, old No  significant change since last tracing Confirmed by BEATON  MD, ROBERT  ([id number]) on 06/07/2014 8:10:02 AM      MDM   Final diagnoses:  Dizziness  Epigastric pain  Nasal congestion   Pt with dizziness, nausea, abdomina pain, congestion, sore throat, weakness. Will get labs, CXR, abd xray, will try IV fluids, meclizine. VS normal. No focal neuro deficits.    11:34 AM Pt continues to be dizzy after meclizine. She was able to get up and walk but complaining of feeling dizzy. She denies headache, no sensation of room spinning.  Labs all normal. UA normal. Pt received 1L of fluids. Will try valium for her symptoms. Pt was also able to eat a whole Sandwich and drink water with no difficulty.   12:38 PM Pt feeling better. Ambulated with no dizziness. Pt wants to be discharged home. Will d/c home with pcp follow up. Low suspicion at this time of any acute posterior circulation stroke or cardiac cause of her dizziness given complete resolution of symptoms. Normal VS. Negative ecg, trop, labs.   Filed Vitals:   06/07/14 1130 06/07/14 1149 06/07/14 1200 06/07/14 1243  BP: 110/55 115/78 104/70 104/69  Pulse: 71 77 90 83  Temp:    97.9 F (36.6 C)  TempSrc:    Oral  Resp:  18  20  Height:      Weight:  SpO2: 100% 100% 100% 100%       Renold Genta, PA-C 06/07/14 1250

## 2014-06-07 NOTE — ED Notes (Signed)
Pt brought back to room via wheelchair; Dietrich Pates, RN and Judson Roch, RN present in gown; gown placed on stretcher for pt to undress and place on

## 2014-06-07 NOTE — ED Notes (Signed)
Pt provided [place] sandwich and sprite.

## 2014-06-07 NOTE — Discharge Instructions (Signed)
Take sudafed for congestion as prescribed. Take meclizine for dizziness as needed. Follow up with primary care doctor.   Dizziness Dizziness is a common problem. It is a feeling of unsteadiness or light-headedness. You may feel like you are about to faint. Dizziness can lead to injury if you stumble or fall. A person of any age group can suffer from dizziness, but dizziness is more common in older adults. CAUSES  Dizziness can be caused by many different things, including:  Middle ear problems.  Standing for too long.  Infections.  An allergic reaction.  Aging.  An emotional response to something, such as the sight of blood.  Side effects of medicines.  Tiredness.  Problems with circulation or blood pressure.  Excessive use of alcohol or medicines, or illegal drug use.  Breathing too fast (hyperventilation).  An irregular heart rhythm (arrhythmia).  A low red blood cell count (anemia).  Pregnancy.  Vomiting, diarrhea, fever, or other illnesses that cause body fluid loss (dehydration).  Diseases or conditions such as Parkinson's disease, high blood pressure (hypertension), diabetes, and thyroid problems.  Exposure to extreme heat. DIAGNOSIS  Your health care provider will ask about your symptoms, perform a physical exam, and perform an electrocardiogram (ECG) to record the electrical activity of your heart. Your health care provider may also perform other heart or blood tests to determine the cause of your dizziness. These may include:  Transthoracic echocardiogram (TTE). During echocardiography, sound waves are used to evaluate how blood flows through your heart.  Transesophageal echocardiogram (TEE).  Cardiac monitoring. This allows your health care provider to monitor your heart rate and rhythm in real time.  Holter monitor. This is a portable device that records your heartbeat and can help diagnose heart arrhythmias. It allows your health care provider to track  your heart activity for several days if needed.  Stress tests by exercise or by giving medicine that makes the heart beat faster. TREATMENT  Treatment of dizziness depends on the cause of your symptoms and can vary greatly. HOME CARE INSTRUCTIONS   Drink enough fluids to keep your urine clear or pale yellow. This is especially important in very hot weather. In older adults, it is also important in cold weather.  Take your medicine exactly as directed if your dizziness is caused by medicines. When taking blood pressure medicines, it is especially important to get up slowly.  Rise slowly from chairs and steady yourself until you feel okay.  In the morning, first sit up on the side of the bed. When you feel okay, stand slowly while holding onto something until you know your balance is fine.  Move your legs often if you need to stand in one place for a long time. Tighten and relax your muscles in your legs while standing.  Have someone stay with you for 1-2 days if dizziness continues to be a problem. Do this until you feel you are well enough to stay alone. Have the person call your health care provider if he or she notices changes in you that are concerning.  Do not drive or use heavy machinery if you feel dizzy.  Do not drink alcohol. SEEK IMMEDIATE MEDICAL CARE IF:   Your dizziness or light-headedness gets worse.  You feel nauseous or vomit.  You have problems talking, walking, or using your arms, hands, or legs.  You feel weak.  You are not thinking clearly or you have trouble forming sentences. It may take a friend or family member to notice  this.  You have chest pain, abdominal pain, shortness of breath, or sweating.  Your vision changes.  You notice any bleeding.  You have side effects from medicine that seems to be getting worse rather than better. MAKE SURE YOU:   Understand these instructions.  Will watch your condition.  Will get help right away if you are not  doing well or get worse. Document Released: 05/28/2001 Document Revised: 12/07/2013 Document Reviewed: 06/21/2011 [facility] Patient Information 2015 [place], [place]. This information is not intended to replace advice given to you by your health care provider. Make sure you discuss any questions you have with your health care provider.

## 2014-06-08 LAB — URINE CULTURE
COLONY COUNT: NO GROWTH
CULTURE: NO GROWTH

## 2014-07-21 ENCOUNTER — Other Ambulatory Visit: Payer: Self-pay

## 2014-07-21 DIAGNOSIS — Z1231 Encounter for screening mammogram for malignant neoplasm of breast: Secondary | ICD-10-CM

## 2014-08-03 ENCOUNTER — Ambulatory Visit
Admission: RE | Admit: 2014-08-03 | Discharge: 2014-08-03 | Disposition: A | Discharge status: Home / Self Care | Payer: PRIVATE HEALTH INSURANCE | Source: Ambulatory Visit

## 2014-08-03 DIAGNOSIS — Z1231 Encounter for screening mammogram for malignant neoplasm of breast: Secondary | ICD-10-CM

## 2015-07-21 ENCOUNTER — Other Ambulatory Visit: Payer: Self-pay

## 2015-07-21 DIAGNOSIS — Z1231 Encounter for screening mammogram for malignant neoplasm of breast: Secondary | ICD-10-CM

## 2015-07-24 ENCOUNTER — Other Ambulatory Visit: Payer: Self-pay | Admitting: Internal Medicine

## 2015-07-24 DIAGNOSIS — N950 Postmenopausal bleeding: Secondary | ICD-10-CM

## 2015-07-25 ENCOUNTER — Other Ambulatory Visit: Payer: Medicaid Other

## 2015-07-28 ENCOUNTER — Other Ambulatory Visit: Payer: Medicaid Other

## 2015-08-14 ENCOUNTER — Ambulatory Visit
Admission: RE | Admit: 2015-08-14 | Discharge: 2015-08-14 | Disposition: A | Discharge status: Home / Self Care | Payer: Medicare Other | Source: Ambulatory Visit | Attending: Internal Medicine | Admitting: Internal Medicine

## 2015-08-14 ENCOUNTER — Ambulatory Visit
Admission: RE | Admit: 2015-08-14 | Discharge: 2015-08-14 | Disposition: A | Discharge status: Home / Self Care | Payer: Medicare Other | Source: Ambulatory Visit

## 2015-08-14 DIAGNOSIS — N950 Postmenopausal bleeding: Secondary | ICD-10-CM

## 2015-08-14 DIAGNOSIS — Z1231 Encounter for screening mammogram for malignant neoplasm of breast: Secondary | ICD-10-CM

## 2016-02-06 ENCOUNTER — Ambulatory Visit (INDEPENDENT_AMBULATORY_CARE_PROVIDER_SITE_OTHER): Payer: Medicare Other | Admitting: Pulmonary Disease

## 2016-02-06 ENCOUNTER — Encounter: Payer: Self-pay | Admitting: Pulmonary Disease

## 2016-02-06 VITALS — BP 102/68 | HR 88 | Ht 64.5 in | Wt 133.0 lb

## 2016-02-06 DIAGNOSIS — J431 Panlobular emphysema: Secondary | ICD-10-CM

## 2016-02-06 DIAGNOSIS — J439 Emphysema, unspecified: Secondary | ICD-10-CM | POA: Insufficient documentation

## 2016-02-06 DIAGNOSIS — Z720 Tobacco use: Secondary | ICD-10-CM | POA: Diagnosis not present

## 2016-02-06 DIAGNOSIS — F17210 Nicotine dependence, cigarettes, uncomplicated: Secondary | ICD-10-CM | POA: Insufficient documentation

## 2016-02-06 MED ORDER — ALBUTEROL SULFATE (2.5 MG/3ML) 0.083% IN NEBU: 2.5000 mg | INHALATION_SOLUTION | Freq: Four times a day (QID) | RESPIRATORY_TRACT | Status: AC | PRN

## 2016-02-06 NOTE — Assessment & Plan Note (Signed)
She has severe COPD.  Currently her symptoms are fairly well controlled but she does have some dyspnea with moderate exertion. She does not have frequent exacerbations.   -O2 therapy: Ambulate on room air and measure O2 saturation today -Immunizations: update next visit -Tobacco use: Counseled to quit at length today -Exercise: Encouraged regular exercise -Bronchodilator therapy: Trial of Stiolto instead of Spiriva -Exacerbation prevention: ___

## 2016-02-06 NOTE — Patient Instructions (Signed)
Try taking Stiolto instead of the Spiriva daily, let me know if it is better than the Spiriva Stop smoking We will refer you to the low-dose lung cancer screening program We will see you back in 2-3 months or sooner if needed

## 2016-02-06 NOTE — Progress Notes (Signed)
Subjective:    Patient ID: [name] [name], female    DOB: 1961/08/20, 55 y.o.   MRN: [id number]  HPI Chief Complaint  Patient presents with  . Advice Only    self referral- being treated for COPD at [facility], transferring care here d/t closer location.  Pt has 02 (2lpm) but seldom wears it.  DME: Mechele Dawley has COPD and previously followed with D.r Mechele Claude Kussin at [facility] for the same.  She was diagnosed in 2006 or 2007.  She was treated with Anoro in 2015 and it made her feel more dyspneic.   She says that she was diagnosed with asthma as a teenager and has smoked since then.  She currently smoked 1/2 packs per day and has smoked ever since her teenage years execept one year when she quit with Chantix.  However she had depression after quitting smoking for a year so she staretd back.  She ocntinues to smoke every day since then.  She has never been hospitalized for her COPD fortunately.    She has not been experiencing much shortness of breath lately.  She will get some dyspnea with exertion.  She will stop if she ever climbs more than 1-2 flights of stairs.  She can run a vacuum cleaner.    She coughs every day, but it is typically dry.  She has been experiencing more sinus congestion lately because of the pollen.  She has allergic rhinitis.    Past Medical History  Diagnosis Date  . Seasonal allergies   . Anxiety   . Depression   . COPD (chronic obstructive pulmonary disease) ([place])   . Osteoporosis   . Sickle cell trait (HCC)      Family History  Problem Relation Age of Onset  . Colon cancer Father 23  . Stomach cancer Neg Hx   . Throat cancer Brother   . Heart disease Mother      Social History   Social History  . Marital Status: Single    Spouse Name: N/A  . Number of Children: N/A  . Years of Education: N/A   Occupational History  . Not on file.   Social History Main Topics  . Smoking status: Current Every Day Smoker -- 1.00 packs/day for 40 years   Types: Cigarettes  . Smokeless tobacco: Never Used     Comment: down to .5ppd 02/06/2016  . Alcohol Use: 0.0 oz/week    0 Standard drinks or equivalent per week     Comment: occasional  . Drug Use: No  . Sexual Activity: Not on file   Other Topics Concern  . Not on file   Social History Narrative     No Known Allergies   Outpatient Prescriptions Prior to Visit  Medication Sig Dispense Refill  . albuterol (PROVENTIL HFA) 108 (90 BASE) MCG/ACT inhaler Inhale 1 puff into the lungs every 6 (six) hours as needed for wheezing or shortness of breath.     Marland Kitchen buPROPion (WELLBUTRIN SR) 100 MG 12 hr tablet Take 100 mg by mouth 2 (two) times daily.      Marland Kitchen doxepin (SINEQUAN) 10 MG capsule Take 10 mg by mouth at bedtime as needed (itch). Reported on 02/06/2016    . Multiple Vitamin (MULTIVITAMIN) tablet Take 1 tablet by mouth daily.      . pseudoephedrine (SUDAFED) 30 MG tablet Take 1 tablet (30 mg total) by mouth every 4 (four) hours as needed for congestion. 30 tablet 0  .  raloxifene (EVISTA) 60 MG tablet Take 60 mg by mouth daily.      Marland Kitchen tiotropium (SPIRIVA) 18 MCG inhalation capsule Place 18 mcg into inhaler and inhale daily.      . fexofenadine (ALLEGRA) 180 MG tablet Take 180 mg by mouth daily as needed for allergies. Reported on 02/06/2016    . meclizine (ANTIVERT) 25 MG tablet Take 1 tablet (25 mg total) by mouth 3 (three) times daily as needed. (Patient not taking: Reported on 02/06/2016) 30 tablet 0   Facility-Administered Medications Prior to Visit  Medication Dose Route Frequency Provider Last Rate Last Dose  . 0.9 %  sodium chloride infusion  500 mL Intravenous Continuous Irene Shipper, MD            Review of Systems  Constitutional: Positive for unexpected weight change. Negative for fever.  HENT: Positive for congestion. Negative for dental problem, ear pain, nosebleeds, postnasal drip, rhinorrhea, sinus pressure, sneezing, sore throat and trouble swallowing.   Eyes: Negative for  redness and itching.  Respiratory: Positive for cough, chest tightness and shortness of breath. Negative for wheezing.   Cardiovascular: Negative for palpitations and leg swelling.  Gastrointestinal: Negative for nausea and vomiting.  Genitourinary: Negative for dysuria.  Musculoskeletal: Negative for joint swelling.  Skin: Negative for rash.  Neurological: Negative for headaches.  Hematological: Does not bruise/bleed easily.  Psychiatric/Behavioral: Negative for dysphoric mood. The patient is not nervous/anxious.        Objective:   Physical Exam  Filed Vitals:   02/06/16 1530  BP: 102/68  Pulse: 88  Height: 5' 4.5" (1.638 m)  Weight: 133 lb (60.328 kg)  SpO2: 93%  RA  Gen: well appearing, no acute distress HENT: NCAT, OP clear, neck supple without masses Eyes: PERRL, EOMi Lymph: no cervical lymphadenopathy PULM: CTA B CV: RRR, no mgr, no JVD GI: BS+, soft, nontender, no hsm Derm: no rash or skin breakdown MSK: normal bulk and tone Neuro: A&Ox4, CN II-XII intact, strength 5/5 in all 4 extremities Psyche: normal mood and affect  2016 pulmonary function testing from [facility] showed FEV1 of 870 mL (37% predicted) consistent with severe airflow obstruction 2015 CT scan from [facility] showed severe panlobular emphysema       Assessment & Plan:  Tobacco abuse Unfortunately she continues to smoke. She has smoked one half pack of cigarettes daily for the last 40 years. I believe that she qualifies for low-dose lung cancer screening as I think she smoked more than one half pack of cigarettes when she was younger.  I will refer her to the low-dose lung cancer screening program. I have also suggested that she attend one of our smoking cessation classes. I offered medical therapy (Chantix) but she is not interested right now.  COPD with emphysema ([place]) She has severe COPD.  Currently her symptoms are fairly well controlled but she does have some dyspnea with moderate exertion. She  does not have frequent exacerbations.   -O2 therapy: Ambulate on room air and measure O2 saturation today -Immunizations: update next visit -Tobacco use: Counseled to quit at length today -Exercise: Encouraged regular exercise -Bronchodilator therapy: Trial of Stiolto instead of Spiriva -Exacerbation prevention: ___       Current outpatient prescriptions:  .  albuterol (PROVENTIL HFA) 108 (90 BASE) MCG/ACT inhaler, Inhale 1 puff into the lungs every 6 (six) hours as needed for wheezing or shortness of breath. , Disp: , Rfl:  .  buPROPion (WELLBUTRIN SR) 100 MG 12 hr tablet, Take  100 mg by mouth 2 (two) times daily.  , Disp: , Rfl:  .  doxepin (SINEQUAN) 10 MG capsule, Take 10 mg by mouth at bedtime as needed (itch). Reported on 02/06/2016, Disp: , Rfl:  .  Multiple Vitamin (MULTIVITAMIN) tablet, Take 1 tablet by mouth daily.  , Disp: , Rfl:  .  pseudoephedrine (SUDAFED) 30 MG tablet, Take 1 tablet (30 mg total) by mouth every 4 (four) hours as needed for congestion., Disp: 30 tablet, Rfl: 0 .  raloxifene (EVISTA) 60 MG tablet, Take 60 mg by mouth daily.  , Disp: , Rfl:  .  tiotropium (SPIRIVA) 18 MCG inhalation capsule, Place 18 mcg into inhaler and inhale daily.  , Disp: , Rfl:   Current facility-administered medications:  .  0.9 %  sodium chloride infusion, 500 mL, Intravenous, Continuous, Irene Shipper, MD

## 2016-02-06 NOTE — Assessment & Plan Note (Signed)
Unfortunately she continues to smoke. She has smoked one half pack of cigarettes daily for the last 40 years. I believe that she qualifies for low-dose lung cancer screening as I think she smoked more than one half pack of cigarettes when she was younger.  I will refer her to the low-dose lung cancer screening program. I have also suggested that she attend one of our smoking cessation classes. I offered medical therapy (Chantix) but she is not interested right now.

## 2016-02-06 NOTE — Addendum Note (Signed)
Addended by: Len Blalock on: 02/06/2016 04:24 PM   Modules accepted: Orders

## 2016-02-13 ENCOUNTER — Telehealth: Payer: Self-pay | Admitting: Pulmonary Disease

## 2016-02-13 NOTE — Telephone Encounter (Signed)
LMTCB

## 2016-02-13 NOTE — Telephone Encounter (Signed)
Spoke with the pt  She states that since she started taking Stiolto she has had diff with urinating and hoarseness  She states that she feels her breathing was doing better with taking Spiriva  Please advise thanks!

## 2016-02-13 NOTE — Telephone Encounter (Signed)
LMOM TCB x1  From 2.21.17 w/ BQ: Patient Instructions       Try taking Stiolto instead of the Spiriva daily, let me know if it is better than the Spiriva Stop smoking We will refer you to the low-dose lung cancer screening program We will see you back in 2-3 months or sooner if needed

## 2016-02-13 NOTE — Telephone Encounter (Signed)
Go back to Spiriva

## 2016-02-13 NOTE — Telephone Encounter (Signed)
Patient Returned call [phone]

## 2016-02-15 NOTE — Telephone Encounter (Signed)
Spoke with pt. She is aware to go back on Spiriva. No prescription is needed at this time. Nothing further was needed.

## 2016-03-07 ENCOUNTER — Telehealth: Payer: Self-pay | Admitting: Pulmonary Disease

## 2016-03-07 NOTE — Telephone Encounter (Signed)
Called patient to discuss the lung cancer screening program/questionnaire. Pt declined at this time and reports she has a lot going on. She wants to hold off on this and reports will let us know when she decides she wants to do the program. Will inform referring provider Dr. Lake Bells as an Juluis Rainier.

## 2016-03-07 NOTE — Telephone Encounter (Signed)
OK 

## 2016-04-09 ENCOUNTER — Other Ambulatory Visit: Payer: Self-pay | Admitting: Internal Medicine

## 2016-04-09 DIAGNOSIS — M8580 Other specified disorders of bone density and structure, unspecified site: Secondary | ICD-10-CM

## 2016-04-25 ENCOUNTER — Ambulatory Visit
Admission: RE | Admit: 2016-04-25 | Discharge: 2016-04-25 | Disposition: A | Discharge status: Home / Self Care | Payer: Medicare Other | Source: Ambulatory Visit | Attending: Internal Medicine | Admitting: Internal Medicine

## 2016-04-25 DIAGNOSIS — M8580 Other specified disorders of bone density and structure, unspecified site: Secondary | ICD-10-CM

## 2016-05-09 ENCOUNTER — Encounter: Payer: Self-pay | Admitting: Pulmonary Disease

## 2016-05-09 ENCOUNTER — Ambulatory Visit (INDEPENDENT_AMBULATORY_CARE_PROVIDER_SITE_OTHER): Payer: Medicare Other | Admitting: Pulmonary Disease

## 2016-05-09 ENCOUNTER — Other Ambulatory Visit: Payer: Self-pay | Admitting: Acute Care

## 2016-05-09 VITALS — BP 128/82 | HR 68 | Ht 64.5 in | Wt 134.0 lb

## 2016-05-09 DIAGNOSIS — Z720 Tobacco use: Secondary | ICD-10-CM

## 2016-05-09 DIAGNOSIS — J431 Panlobular emphysema: Secondary | ICD-10-CM | POA: Diagnosis not present

## 2016-05-09 DIAGNOSIS — F17210 Nicotine dependence, cigarettes, uncomplicated: Principal | ICD-10-CM

## 2016-05-09 MED ORDER — UMECLIDINIUM-VILANTEROL 62.5-25 MCG/INH IN AEPB: 1.0000 | INHALATION_SPRAY | Freq: Every day | RESPIRATORY_TRACT | Status: DC

## 2016-05-09 NOTE — Patient Instructions (Signed)
Stop smoking Take the Anoro for 1 week instead of the Spiriva, call me to let me know how it works Follow up with [place] in 6 months.

## 2016-05-09 NOTE — Progress Notes (Signed)
Subjective:    Patient ID: [name] [name], female    DOB: 03-Mar-1961, 55 y.o.   MRN: [id number]  Synopsis: First referred in 2017 for evaluation of COPD. 2016 pulmonary function testing from [facility] showed FEV1 of 870 mL (37% predicted) consistent with severe airflow obstruction 2015 CT scan from [facility] showed severe panlobular emphysema She is prescribed 2 L O2 qHS and she sometimes uses a portable tank. She continues to smoke cigarettes now.  She quit smoking for one year in 2013 with Chantix.  She tried taking it again when she started back but it didn't help. She smokes 1/2 ppd as of 2017 but she thinks she smoked more.  She started smoking at age 23.    HPI Chief Complaint  Patient presents with  . Follow-up    Pt c/o occasional cough with white/yellow mucus, some SOB and wheeze when weather is hot/humid. Pt has also noticed recent hoarseness. Pt denies CP/tightness.    [name] says that her breathing is doing OK.  She said that she had a hard time with the [place].  She felt more short of breath on it than she had to go back to Spiriva.  She said she had a hard time feeling like she was getting enough.  She is doing OK on Spiriva. HOwever the hot weather still makes her more short of breath.   She has not had an exacerbation of her COPD since the last visit.   She still uses oxygen at night.   Past Medical History  Diagnosis Date  . Seasonal allergies   . Anxiety   . Depression   . COPD (chronic obstructive pulmonary disease) ([place])   . Osteoporosis   . Sickle cell trait ([place])       Review of Systems  Constitutional: Negative for fever, chills and fatigue.  HENT: Negative for rhinorrhea, sinus pressure and sneezing.   Respiratory: Negative for cough, wheezing and stridor.   Cardiovascular: Negative for chest pain, palpitations and leg swelling.       Objective:   Physical Exam Filed Vitals:   05/09/16 0945  BP: 128/82  Pulse: 68  Height: 5' 4.5" (1.638 m)  Weight: 134 lb  (60.782 kg)  SpO2: 93%   RA  Gen: well appearing HENT: OP clear, TM's clear, neck supple PULM: poor air movement, no wheezing CV: RRR, no mgr, trace edema GI: BS+, soft, nontender Derm: no cyanosis or rash Psyche: normal mood and affect        Assessment & Plan:  COPD with emphysema ([place]) This has been a stable interval for [name] in that she has not had an exacerbation. However, she remains symptomatic. I do think she would benefit from the addition of a long-acting beta agonist, unfortunately she was unable to tolerate Stiolto.  Plan: Trial of Anoro instead of Spiriva If Anoro is effective then we will change over to that medicine Quit smoking F/U 6 months  Tobacco abuse She continues to smoke one half pack of cigarettes daily. She tells me the past she thinks she smoked more than this. She has smoked for 40 years.  Plan: She was counseled to quit smoking today, smoking cessation classes and information were provided I don't have another medicine to recommend as she has failed Chantix and she is currently taking Wellbutrin Referral to lung cancer screening program     Current outpatient prescriptions:  .  albuterol (PROVENTIL HFA) 108 (90 BASE) MCG/ACT inhaler, Inhale 1 puff into the  lungs every 6 (six) hours as needed for wheezing or shortness of breath. , Disp: , Rfl:  .  albuterol (PROVENTIL) (2.5 MG/3ML) 0.083% nebulizer solution, Take 3 mLs (2.5 mg total) by nebulization every 6 (six) hours as needed for wheezing or shortness of breath., Disp: 360 mL, Rfl: 12 .  buPROPion (WELLBUTRIN SR) 100 MG 12 hr tablet, Take 100 mg by mouth 2 (two) times daily.  , Disp: , Rfl:  .  doxepin (SINEQUAN) 10 MG capsule, Take 10 mg by mouth at bedtime as needed (itch). Reported on 02/06/2016, Disp: , Rfl:  .  Multiple Vitamin (MULTIVITAMIN) tablet, Take 1 tablet by mouth daily.  , Disp: , Rfl:  .  pseudoephedrine (SUDAFED) 30 MG tablet, Take 1 tablet (30 mg total) by mouth every  4 (four) hours as needed for congestion., Disp: 30 tablet, Rfl: 0 .  raloxifene (EVISTA) 60 MG tablet, Take 60 mg by mouth daily.  , Disp: , Rfl:  .  tiotropium (SPIRIVA) 18 MCG inhalation capsule, Place 18 mcg into inhaler and inhale daily.  , Disp: , Rfl:   Current facility-administered medications:  .  0.9 %  sodium chloride infusion, 500 mL, Intravenous, Continuous, Irene Shipper, MD

## 2016-05-09 NOTE — Assessment & Plan Note (Signed)
This has been a stable interval for [name] [name] in that she has not had an exacerbation. However, she remains symptomatic. I do think she would benefit from the addition of a long-acting beta agonist, unfortunately she was unable to tolerate Stiolto.  Plan: Trial of Anoro instead of Spiriva If Anoro is effective then we will change over to that medicine Quit smoking F/U 6 months

## 2016-05-09 NOTE — Assessment & Plan Note (Signed)
She continues to smoke one half pack of cigarettes daily. She tells me the past she thinks she smoked more than this. She has smoked for 40 years.  Plan: She was counseled to quit smoking today, smoking cessation classes and information were provided I don't have another medicine to recommend as she has failed Chantix and she is currently taking Wellbutrin Referral to lung cancer screening program

## 2016-05-16 ENCOUNTER — Telehealth: Payer: Self-pay | Admitting: Pulmonary Disease

## 2016-05-16 NOTE — Telephone Encounter (Signed)
Spoke with the pt  She was seen on 05/09/16 and given the following instructions:   Stop smoking Take the Anoro for 1 week instead of the Spiriva, call me to let me know how it works Follow up with [place] in 6 months  First she stated that she could not tell any difference in her breathing since switching from spiriva to anoro Also, since she started taking med she is having increased urinary pain- I urged her to call her PCP about this since she states it has been and ongoing issue  Finally, she stated that the Anoro has helped her breathing improve   Do you want [place] to send rx?  Please advise thanks!

## 2016-05-17 NOTE — Telephone Encounter (Signed)
lmtcb x1 for pt. 

## 2016-05-17 NOTE — Telephone Encounter (Signed)
OK to switch to Anoro, please Rx See PCP for dysuria

## 2016-05-20 ENCOUNTER — Telehealth: Payer: Self-pay | Admitting: Pulmonary Disease

## 2016-05-20 MED ORDER — UMECLIDINIUM-VILANTEROL 62.5-25 MCG/INH IN AEPB: 1.0000 | INHALATION_SPRAY | Freq: Every day | RESPIRATORY_TRACT | Status: DC

## 2016-05-20 NOTE — Telephone Encounter (Signed)
anoro sent to pharmacy- see 05/20/16 phone note.

## 2016-05-20 NOTE — Telephone Encounter (Signed)
Attempted to contact pt. Line was busy. Will try back. 

## 2016-05-20 NOTE — Telephone Encounter (Signed)
Pt calling to check the status of rx, please advise.[name] [name]

## 2016-05-20 NOTE — Telephone Encounter (Signed)
rx sent to preferred pharmacy.  Pt aware.  Nothing further needed.  

## 2016-05-27 ENCOUNTER — Encounter: Payer: Self-pay | Admitting: Acute Care

## 2016-05-27 ENCOUNTER — Ambulatory Visit (INDEPENDENT_AMBULATORY_CARE_PROVIDER_SITE_OTHER): Payer: Medicare Other | Admitting: Acute Care

## 2016-05-27 ENCOUNTER — Ambulatory Visit (INDEPENDENT_AMBULATORY_CARE_PROVIDER_SITE_OTHER)
Admission: RE | Admit: 2016-05-27 | Discharge: 2016-05-27 | Disposition: A | Discharge status: Home / Self Care | Payer: Medicare Other | Source: Ambulatory Visit | Attending: Acute Care | Admitting: Acute Care

## 2016-05-27 DIAGNOSIS — F17210 Nicotine dependence, cigarettes, uncomplicated: Secondary | ICD-10-CM

## 2016-05-27 NOTE — Progress Notes (Signed)
Shared Decision Making Visit Lung Cancer Screening Program [phone])   Eligibility:  Age 55 y.o.  Pack Years Smoking History Calculation 30+ pack year smoking history (# packs/per year x # years smoked)  Recent History of coughing up blood  No  Unexplained weight loss? No ( >Than 15 pounds within the last 6 months )  Prior History Lung / other cancer No (Diagnosis within the last 5 years already requiring surveillance chest CT Scans).  Smoking Status Current smoker  Former Smokers: NA  Quit Date: NA  Visit Components:  Discussion included one or more decision making aids. Yes  Discussion included risk/benefits of screening. Yes  Discussion included potential follow up diagnostic testing for abnormal scans. Yes  Discussion included meaning and risk of over diagnosis. Yes  Discussion included meaning and risk of False Positives. Yes  Discussion included meaning of total radiation exposure. Yes  Counseling Included:  Importance of adherence to annual lung cancer LDCT screening. Yes  Impact of comorbidities on ability to participate in the program. Yes  Ability and willingness to under diagnostic treatment.Yes  Smoking Cessation Counseling:  Current Smokers:   Discussed importance of smoking cessation. Yes  Information about tobacco cessation classes and interventions provided to patient. Yes  Patient provided with "ticket" for LDCT Scan. Yes  Symptomatic Patient. No  CounselingNA  Diagnosis Code: Tobacco Use Z72.0  Asymptomatic Patient Yes  Counseling Yes  Former Smokers:   Discussed the importance of maintaining cigarette abstinence. Yes  Diagnosis Code: Personal History of Nicotine Dependence. [id number]  Information about tobacco cessation classes and interventions provided to patient. Yes  Patient provided with "ticket" for LDCT Scan. Yes  Written Order for Lung Cancer Screening with LDCT placed in Epic. Yes (CT Chest Lung Cancer Screening Low  Dose W/O CM) [id number] Z12.2-Screening of respiratory organs Z87.891-Personal history of nicotine dependence  I have spent 20 minutes of face to face time with  Ms. Simoni discussing the risks and benefits of lung cancer screening. We viewed a power point together that explained in detail the above noted topics. We paused at intervals to allow for questions to be asked and answered to ensure understanding.We discussed that the single most powerful action that she can take to decrease her risk of developing lung cancer is to quit smoking. We discussed whether or not she is ready to commit to setting a quit date. She is not ready to set a quit date today.We discussed options for tools to aid in quitting smoking including nicotine replacement therapy, non-nicotine medications, support groups, Quit Smart classes, and behavior modification. We discussed that often times setting smaller, more achievable goals, such as eliminating 1 cigarette a day for a week and then 2 cigarettes a day for a week can be helpful in slowly decreasing the number of cigarettes smoked. This allows for a sense of accomplishment as well as providing a clinical benefit. I gave her  the " Be Stronger Than Your Excuses" card with contact information for community resources, classes, free nicotine replacement therapy, and access to mobile apps, text messaging, and on-line smoking cessation help. I have also given her my card and contact information in the event she needs to contact me. We discussed the time and location of the scan, and that either June Leap, CMA, or I will call with the results within 24-48 hours of receiving them. I have provided her with a copy of the power point we viewed  as a resource in the event they need reinforcement  of the concepts we discussed today in the office. The patient verbalized understanding of all of  the above and had no further questions upon leaving the office. They have my contact information in the  event they have any further questions.   Magdalen Spatz, NP  05/27/2016

## 2016-05-29 ENCOUNTER — Telehealth: Payer: Self-pay | Admitting: Acute Care

## 2016-05-29 DIAGNOSIS — F17210 Nicotine dependence, cigarettes, uncomplicated: Principal | ICD-10-CM

## 2016-05-29 NOTE — Telephone Encounter (Signed)
I have called [name] [name] with the results of her low-dose CT scan. I explained that her scan was read as a lung RADS 2, nodules that are benign in both appearance and behavior. I also explained to her that the scan revealed COPD and emphysema which she artery need. I will forward the results of the scan to her primary care physician for her medical record. And to ensure that he is treating her atherosclerosis. The patient verbalized understanding of the above and had no further questions.

## 2016-05-29 NOTE — Telephone Encounter (Signed)
LM x 1 for pt, made aware that we will call once results are available  Please advise [name] [name], thanks.

## 2016-06-01 ENCOUNTER — Other Ambulatory Visit: Payer: Self-pay

## 2016-06-01 ENCOUNTER — Emergency Department (HOSPITAL_COMMUNITY)
Admission: EM | Admit: 2016-06-01 | Discharge: 2016-06-01 | Discharge status: Against Medical Advice | Payer: Medicare Other | Attending: Emergency Medicine | Admitting: Emergency Medicine

## 2016-06-01 ENCOUNTER — Emergency Department (HOSPITAL_COMMUNITY): Payer: Medicare Other

## 2016-06-01 ENCOUNTER — Encounter (HOSPITAL_COMMUNITY): Payer: Self-pay

## 2016-06-01 DIAGNOSIS — F17210 Nicotine dependence, cigarettes, uncomplicated: Secondary | ICD-10-CM | POA: Diagnosis not present

## 2016-06-01 DIAGNOSIS — R079 Chest pain, unspecified: Secondary | ICD-10-CM | POA: Insufficient documentation

## 2016-06-01 DIAGNOSIS — J449 Chronic obstructive pulmonary disease, unspecified: Secondary | ICD-10-CM | POA: Diagnosis not present

## 2016-06-01 LAB — BASIC METABOLIC PANEL
Anion gap: 7 (ref 5–15)
BUN: 15 mg/dL (ref 6–20)
CO2: 24 mmol/L (ref 22–32)
Calcium: 9.3 mg/dL (ref 8.9–10.3)
Chloride: 108 mmol/L (ref 101–111)
Creatinine, Ser: 0.74 mg/dL (ref 0.44–1.00)
GFR calc Af Amer: >60 mL/min (ref >60)
GFR calc non Af Amer: >60 mL/min (ref >60)
GLUCOSE: 92 mg/dL (ref 65–99)
POTASSIUM: 3.6 mmol/L (ref 3.5–5.1)
Sodium: 139 mmol/L (ref 135–145)

## 2016-06-01 LAB — CBC
HEMATOCRIT: 40.2 % (ref 36.0–46.0)
Hemoglobin: 13.7 g/dL (ref 12.0–15.0)
MCH: 28.1 pg (ref 26.0–34.0)
MCHC: 34.1 g/dL (ref 30.0–36.0)
MCV: 82.5 fL (ref 78.0–100.0)
Platelets: 242 10*3/uL (ref 150–400)
RBC: 4.87 MIL/uL (ref 3.87–5.11)
RDW: 14.5 % (ref 11.5–15.5)
WBC: 11.2 10*3/uL — ABNORMAL HIGH (ref 4.0–10.5)

## 2016-06-01 LAB — I-STAT TROPONIN, ED: Troponin i, poc: 0 ng/mL (ref 0.00–0.08)

## 2016-06-01 MED ORDER — IPRATROPIUM-ALBUTEROL 0.5-2.5 (3) MG/3ML IN SOLN
3.0000 mL | Freq: Once | RESPIRATORY_TRACT | Status: AC
  Administered 2016-06-01: 3 mL via RESPIRATORY_TRACT
  Filled 2016-06-01: qty 3

## 2016-06-01 NOTE — ED Provider Notes (Signed)
CSN: [id number]     Arrival date & time 06/01/16  1049 History   First MD Initiated Contact with Patient 06/01/16 1119     Chief Complaint  Patient presents with  . Chest Pain     (Consider location/radiation/quality/duration/timing/severity/associated sxs/prior Treatment) Patient is a 55 y.o. female presenting with chest pain. The history is provided by the patient.  Chest Pain Associated symptoms: cough and shortness of breath   Associated symptoms: no abdominal pain, no back pain, no fever, no headache and no palpitations    Patient with complaint of left-sided chest pain since yesterday evening. Spin constant worse with cough and moving around. No nausea no vomiting. No fevers. Patient has a history of COPD but she says this feels somewhat different. She does have home oxygen available at home uses it as needed. Patient also has nebulizer treatments at home. No injury to the chest. Pain was nonradiating.   Past Medical History  Diagnosis Date  . Seasonal allergies   . Anxiety   . Depression   . COPD (chronic obstructive pulmonary disease) ([place])   . Osteoporosis   . Sickle cell trait [facility])    Past Surgical History  Procedure Laterality Date  . Cesarean section  1980   Family History  Problem Relation Age of Onset  . Colon cancer Father 15  . Stomach cancer Neg Hx   . Throat cancer Brother   . Heart disease Mother    Social History  Substance Use Topics  . Smoking status: Current Every Day Smoker -- 1.00 packs/day for 40 years    Types: Cigarettes  . Smokeless tobacco: Never Used     Comment: down to .5ppd 02/06/2016  . Alcohol Use: 0.0 oz/week    0 Standard drinks or equivalent per week     Comment: occasional   OB History    No data available     Review of Systems  Constitutional: Negative for fever.  HENT: Negative for congestion.   Eyes: Negative for visual disturbance.  Respiratory: Positive for cough and shortness of breath. Negative for wheezing.    Cardiovascular: Positive for chest pain. Negative for palpitations and leg swelling.  Gastrointestinal: Negative for abdominal pain.  Genitourinary: Positive for dysuria.  Musculoskeletal: Negative for back pain and neck pain.  Skin: Negative for rash.  Neurological: Negative for headaches.  Hematological: Does not bruise/bleed easily.  Psychiatric/Behavioral: Negative for confusion.      Allergies  Review of patient's allergies indicates no known allergies.  Home Medications   Prior to Admission medications   Medication Sig Start Date End Date Taking? Authorizing Provider  albuterol (PROVENTIL HFA) 108 (90 BASE) MCG/ACT inhaler Inhale 1 puff into the lungs every 6 (six) hours as needed for wheezing or shortness of breath.  03/29/14  Yes Historical Provider, MD  albuterol (PROVENTIL) (2.5 MG/3ML) 0.083% nebulizer solution Take 3 mLs (2.5 mg total) by nebulization every 6 (six) hours as needed for wheezing or shortness of breath. 02/06/16  Yes Juanito Doom, MD  buPROPion (WELLBUTRIN SR) 100 MG 12 hr tablet Take 100 mg by mouth 2 (two) times daily.     Yes Historical Provider, MD  doxepin (SINEQUAN) 10 MG capsule Take 10 mg by mouth at bedtime as needed (itch). Reported on 02/06/2016 03/08/14  Yes Historical Provider, MD  Multiple Vitamin (MULTIVITAMIN) tablet Take 1 tablet by mouth daily.     Yes Historical Provider, MD  raloxifene (EVISTA) 60 MG tablet Take 60 mg by mouth daily.  Yes Historical Provider, MD  tiotropium (SPIRIVA) 18 MCG inhalation capsule Place 18 mcg into inhaler and inhale daily.     Yes Historical Provider, MD  umeclidinium-vilanterol (ANORO ELLIPTA) 62.5-25 MCG/INH AEPB Inhale 1 puff into the lungs daily. 05/20/16  Yes Juanito Doom, MD  pseudoephedrine (SUDAFED) 30 MG tablet Take 1 tablet (30 mg total) by mouth every 4 (four) hours as needed for congestion. Patient not taking: Reported on 06/01/2016 06/07/14   Tatyana Kirichenko, PA-C   BP 129/88 mmHg  Pulse  78  Temp(Src) 99.2 F (37.3 C) (Oral)  Resp 20  SpO2 98% Physical Exam  Constitutional: She is oriented to person, place, and time. She appears well-developed and well-nourished. No distress.  HENT:  Head: Normocephalic and atraumatic.  Mouth/Throat: Oropharynx is clear and moist.  Eyes: Conjunctivae and EOM are normal. Pupils are equal, round, and reactive to light.  Neck: Normal range of motion. Neck supple.  Cardiovascular: Normal rate and regular rhythm.   Pulmonary/Chest: Effort normal and breath sounds normal. No respiratory distress. She has no wheezes. She exhibits tenderness.  Decreased breath sounds bilaterally. Reproducible chest wall tenderness.  Abdominal: Soft. Bowel sounds are normal. There is no tenderness.  Musculoskeletal: She exhibits no edema.  Neurological: She is alert and oriented to person, place, and time. No cranial nerve deficit. She exhibits normal muscle tone. Coordination normal.  Skin: Skin is warm. No rash noted.  Nursing note and vitals reviewed.   ED Course  Procedures (including critical care time) Labs Review Labs Reviewed  CBC - Abnormal; Notable for the following:    WBC 11.2 (*)    All other components within normal limits  BASIC METABOLIC PANEL  I-STAT TROPOININ, ED   Results for orders placed or performed during the hospital encounter of 123456  Basic metabolic panel  Result Value Ref Range   Sodium 139 135 - 145 mmol/L   Potassium 3.6 3.5 - 5.1 mmol/L   Chloride 108 101 - 111 mmol/L   CO2 24 22 - 32 mmol/L   Glucose, Bld 92 65 - 99 mg/dL   BUN 15 6 - 20 mg/dL   Creatinine, Ser 0.74 0.44 - 1.00 mg/dL   Calcium 9.3 8.9 - 10.3 mg/dL   GFR calc non Af Amer >60 >60 mL/min   GFR calc Af Amer >60 >60 mL/min   Anion gap 7 5 - 15  CBC  Result Value Ref Range   WBC 11.2 (H) 4.0 - 10.5 K/uL   RBC 4.87 3.87 - 5.11 MIL/uL   Hemoglobin 13.7 12.0 - 15.0 g/dL   HCT 40.2 36.0 - 46.0 %   MCV 82.5 78.0 - 100.0 fL   MCH 28.1 26.0 - 34.0 pg    MCHC 34.1 30.0 - 36.0 g/dL   RDW 14.5 11.5 - 15.5 %   Platelets 242 150 - 400 K/uL  I-stat troponin, ED  Result Value Ref Range   Troponin i, poc 0.00 0.00 - 0.08 ng/mL   Comment 3             Imaging Review Dg Chest 2 View  06/01/2016  CLINICAL DATA:  Short of breath and left chest pain EXAM: CHEST  2 VIEW COMPARISON:  06/07/2014 FINDINGS: Nodules at the right base and left upper lung zone are stable. Severe emphysema. Volume loss towards the lung bases. Normal heart size. No pneumothorax. IMPRESSION: No active cardiopulmonary disease. Electronically Signed   By: Marybelle Killings M.D.   On: 06/01/2016 11:58  I have personally reviewed and evaluated these images and lab results as part of my medical decision-making.   EKG Interpretation None      ED ECG REPORT   Date: 06/01/2016  Rate: 97  Rhythm: normal sinus rhythm  QRS Axis: normal  Intervals: normal  ST/T Wave abnormalities: nonspecific ST/T changes  Conduction Disutrbances:none  Narrative Interpretation:   Old EKG Reviewed: none available  I have personally reviewed the EKG tracing and agree with the computerized printout as noted.     MDM   Final diagnoses:  Chest pain, unspecified chest pain type  Chronic obstructive pulmonary disease, unspecified COPD type ([place])    Patient symptoms seem to be chest wall related. Patient has a history of COPD symptoms worse with breathing. Patient was given breathing treatment with plan for reassessment but she left prior to reassessment so technically left AMA.  Initial troponin was negative so unlikely to be cardiac chest pain since the chest pain has been constant since yesterday evening.  Patient did not feel as if it was her COPD. Patient had no hypoxia. However patient was on 2 L of oxygen she does have home oxygen that she uses intermittently.  Chest x-ray was negative for pneumonia pneumothorax or pulmonary edema.  EKG was originally not available but we eventually  found a copy of it and had no acute changes.    Fredia Sorrow, MD 06/01/16 1524

## 2016-06-01 NOTE — ED Notes (Signed)
Pt states she feels better and is leaving. Encouraged pt to stay for MD reassessment. Pt left with son.

## 2016-06-01 NOTE — ED Notes (Signed)
Pt ambulatory to restroom

## 2016-06-01 NOTE — ED Notes (Signed)
Patient complains of sharp left sided chest pain with radiation to left ribs, shortness of breath with same. No other associated symptoms

## 2016-06-06 ENCOUNTER — Other Ambulatory Visit (HOSPITAL_COMMUNITY)
Admission: RE | Admit: 2016-06-06 | Discharge: 2016-06-06 | Disposition: A | Discharge status: Home / Self Care | Payer: Medicare Other | Source: Ambulatory Visit | Attending: Obstetrics and Gynecology | Admitting: Obstetrics and Gynecology

## 2016-06-06 ENCOUNTER — Other Ambulatory Visit: Payer: Self-pay | Admitting: Obstetrics and Gynecology

## 2016-06-06 DIAGNOSIS — Z1151 Encounter for screening for human papillomavirus (HPV): Secondary | ICD-10-CM | POA: Diagnosis present

## 2016-06-06 DIAGNOSIS — Z01419 Encounter for gynecological examination (general) (routine) without abnormal findings: Secondary | ICD-10-CM | POA: Insufficient documentation

## 2016-06-07 LAB — CYTOLOGY - PAP

## 2016-06-10 ENCOUNTER — Other Ambulatory Visit: Payer: Self-pay | Admitting: Obstetrics and Gynecology

## 2016-06-10 DIAGNOSIS — N950 Postmenopausal bleeding: Secondary | ICD-10-CM

## 2016-06-13 ENCOUNTER — Ambulatory Visit (HOSPITAL_COMMUNITY): Payer: Medicare Other

## 2016-06-17 ENCOUNTER — Ambulatory Visit
Admission: RE | Admit: 2016-06-17 | Discharge: 2016-06-17 | Disposition: A | Discharge status: Home / Self Care | Payer: Medicare Other | Source: Ambulatory Visit | Attending: Obstetrics and Gynecology | Admitting: Obstetrics and Gynecology

## 2016-06-17 DIAGNOSIS — N950 Postmenopausal bleeding: Secondary | ICD-10-CM

## 2016-07-05 ENCOUNTER — Telehealth: Payer: Self-pay | Admitting: Pulmonary Disease

## 2016-07-05 MED ORDER — PREDNISONE 10 MG PO TABS: ORAL_TABLET | ORAL | Status: DC

## 2016-07-05 MED ORDER — DOXYCYCLINE HYCLATE 100 MG PO TABS: 100.0000 mg | ORAL_TABLET | Freq: Two times a day (BID) | ORAL | Status: DC

## 2016-07-05 NOTE — Telephone Encounter (Signed)
Called and spoke with pt and she is aware of BQ recs.  meds have been sent to the pharmacy and pt is aware of appt with TP in august.  Pt advised if she gets worse to go to the ER

## 2016-07-05 NOTE — Telephone Encounter (Signed)
Prednisone: Take 40mg  po daily for 3 days, then take 30mg  po daily for 3 days, then take 20mg  po daily for two days, then take 10mg  po daily for 2 days Doxycycline 100mg  po bid x5 days  Come to [place] or go to ER if no improvement  Schedule first available NP visit

## 2016-07-05 NOTE — Telephone Encounter (Signed)
Patient states that her breathing has gotten worse, the cough medications are not working.  Patient states that she cannot walk to the bathroom from her living room without getting out of breath.  She says she is coughing up a lot of mucus.  Patient has been having to use her oxygen every day now on 2L daily.  She has not been able to go outside at all due to the heat.  Patient has been taking Mucinex.  Says she has dark yellow phlegm.  She is afraid that she is going to end up in the hospital.   Dr. Lake Bells, please advise.  [place].  No Known Allergies

## 2016-07-10 ENCOUNTER — Other Ambulatory Visit: Payer: Self-pay | Admitting: Obstetrics and Gynecology

## 2016-07-16 ENCOUNTER — Other Ambulatory Visit: Payer: Self-pay | Admitting: Internal Medicine

## 2016-07-16 DIAGNOSIS — Z1231 Encounter for screening mammogram for malignant neoplasm of breast: Secondary | ICD-10-CM

## 2016-07-23 ENCOUNTER — Ambulatory Visit (INDEPENDENT_AMBULATORY_CARE_PROVIDER_SITE_OTHER): Payer: Medicare Other | Admitting: Adult Health

## 2016-07-23 ENCOUNTER — Encounter: Payer: Self-pay | Admitting: Adult Health

## 2016-07-23 DIAGNOSIS — J439 Emphysema, unspecified: Secondary | ICD-10-CM

## 2016-07-23 NOTE — Assessment & Plan Note (Signed)
Severe COPD with emphysema, compensated on Spiriva Encouraged on smoking cessation  Plan  Patient Instructions  Stop ANORO . Continue on Spiriva daily  Work on not smoking .  Follow up Dr. Lake Bells 4 months and As needed

## 2016-07-23 NOTE — Progress Notes (Signed)
Subjective:    Patient ID: [name] [name], female    DOB: [birth year], 55 y.o.   MRN: [id number]  HPI 55 year old female, smoker followed for severe COPD with emphysema and chronic hypoxic respiratory failure on 2 L of oxygen at bedtime   TEST /Events 2016 pulmonary function testing from [facility] showed FEV1 of 870 mL (37% predicted) consistent with severe airflow obstruction 2015 CT scan from [facility] showed severe panlobular emphysema She is prescribed 2 L O2 qHS and she sometimes uses a portable tank. She continues to smoke cigarettes now.  She quit smoking for one year in 2013 with Chantix.  She tried taking it again when she started back but it didn't help. She smokes 1/2 ppd as of 2017 but she thinks she smoked more.  She started smoking at age 36.    07/23/2016 Follow up : COPD , smoker  Patient returns for a three-month follow-up. Patient was recently changed over from [facility] to [facility] She says she does not like either one of these and wants to go back on Spiriva. She does continue to smoke. We discussed smoking cessation. He says overall her breathing is doing okay. She denies flare of cough or wheezing. We discussed the back. However, she declines today and was to get it at her primary care physicians during her physical later this month. Agent was seen in the lung cancer screening program in June with a CT screen that showed category 2, benign appearance  with plans to repeat CT chest in 12 months. Diffuse emphysema noted.   Past Medical History:  Diagnosis Date  . Anxiety   . COPD (chronic obstructive pulmonary disease) ([place])   . Depression   . Osteoporosis   . Seasonal allergies   . Sickle cell trait [facility])    Current Outpatient Prescriptions on File Prior to Visit  Medication Sig Dispense Refill  . albuterol (PROVENTIL HFA) 108 (90 BASE) MCG/ACT inhaler Inhale 1 puff into the lungs every 6 (six) hours as needed for wheezing or shortness of breath.     Marland Kitchen albuterol (PROVENTIL)  (2.5 MG/3ML) 0.083% nebulizer solution Take 3 mLs (2.5 mg total) by nebulization every 6 (six) hours as needed for wheezing or shortness of breath. 360 mL 12  . buPROPion (WELLBUTRIN SR) 100 MG 12 hr tablet Take 100 mg by mouth 2 (two) times daily.      . Multiple Vitamin (MULTIVITAMIN) tablet Take 1 tablet by mouth daily.      . raloxifene (EVISTA) 60 MG tablet Take 60 mg by mouth daily.      Marland Kitchen tiotropium (SPIRIVA) 18 MCG inhalation capsule Place 18 mcg into inhaler and inhale daily.       Current Facility-Administered Medications on File Prior to Visit  Medication Dose Route Frequency Provider Last Rate Last Dose  . 0.9 %  sodium chloride infusion  500 mL Intravenous Continuous Irene Shipper, MD          Review of Systems Constitutional:   No  weight loss, night sweats,  Fevers, chills, fatigue, or  lassitude.  HEENT:   No headaches,  Difficulty swallowing,  Tooth/dental problems, or  Sore throat,                No sneezing, itching, ear ache, nasal congestion, post nasal drip,   CV:  No chest pain,  Orthopnea, PND, swelling in lower extremities, anasarca, dizziness, palpitations, syncope.   GI  No heartburn, indigestion, abdominal pain, nausea, vomiting, diarrhea, change in  bowel habits, loss of appetite, bloody stools.   Resp:  No change in color of mucus.  No wheezing.  No chest wall deformity  Skin: no rash or lesions.  GU: no dysuria, change in color of urine, no urgency or frequency.  No flank pain, no hematuria   MS:  No joint pain or swelling.  No decreased range of motion.  No back pain.  Psych:  No change in mood or affect. No depression or anxiety.  No memory loss.         Objective:   Physical Exam Vitals:  07/23/16 1128 BP: 126/80 Pulse: 78 Temp: 98.2 F (36.8 C) TempSrc: Oral SpO2: 90% Weight: 133 lb (60.3 kg) Height: 5\' 3"  (1.6 m)   .GEN: A/Ox3; pleasant , NAD    HEENT:  [place]/AT,  EACs-clear, TMs-wnl, NOSE-clear, THROAT-clear, no lesions, no  postnasal drip or exudate noted.   NECK:  Supple w/ fair ROM; no JVD; normal carotid impulses w/o bruits; no thyromegaly or nodules palpated; no lymphadenopathy.    RESP  Clear  P & A; w/o, wheezes/ rales/ or rhonchi. no accessory muscle use, no dullness to percussion  CARD:  RRR, no m/r/g  , no peripheral edema, pulses intact, no cyanosis or clubbing.  GI:   Soft & nt; nml bowel sounds; no organomegaly or masses detected.   Musco: Warm bil, no deformities or joint swelling noted.   Neuro: alert, no focal deficits noted.    Skin: Warm, no lesions or rashes  Keval Nam NP-C  [facility]  07/23/2016

## 2016-07-23 NOTE — Patient Instructions (Signed)
Stop ANORO . Continue on Spiriva daily  Work on not smoking .  Follow up Dr. Lake Bells 4 months and As needed

## 2016-07-24 NOTE — Progress Notes (Signed)
Reviewed, agree with this plan of care 

## 2016-07-25 ENCOUNTER — Other Ambulatory Visit: Payer: Self-pay | Admitting: Pulmonary Disease

## 2016-08-15 ENCOUNTER — Ambulatory Visit
Admission: RE | Admit: 2016-08-15 | Discharge: 2016-08-15 | Disposition: A | Discharge status: Home / Self Care | Payer: Medicare Other | Source: Ambulatory Visit | Attending: Internal Medicine | Admitting: Internal Medicine

## 2016-08-15 DIAGNOSIS — Z1231 Encounter for screening mammogram for malignant neoplasm of breast: Secondary | ICD-10-CM

## 2016-09-25 ENCOUNTER — Encounter: Payer: Self-pay | Admitting: Internal Medicine

## 2016-10-08 ENCOUNTER — Ambulatory Visit (INDEPENDENT_AMBULATORY_CARE_PROVIDER_SITE_OTHER): Payer: Self-pay | Admitting: Sports Medicine

## 2016-10-14 ENCOUNTER — Ambulatory Visit (INDEPENDENT_AMBULATORY_CARE_PROVIDER_SITE_OTHER): Payer: Medicare Other

## 2016-10-14 ENCOUNTER — Encounter (INDEPENDENT_AMBULATORY_CARE_PROVIDER_SITE_OTHER): Payer: Self-pay | Admitting: Sports Medicine

## 2016-10-14 ENCOUNTER — Ambulatory Visit (INDEPENDENT_AMBULATORY_CARE_PROVIDER_SITE_OTHER): Payer: Medicare Other | Admitting: Sports Medicine

## 2016-10-14 ENCOUNTER — Ambulatory Visit (INDEPENDENT_AMBULATORY_CARE_PROVIDER_SITE_OTHER): Payer: Self-pay

## 2016-10-14 VITALS — BP 148/91 | HR 95 | Ht 64.5 in | Wt 138.0 lb

## 2016-10-14 DIAGNOSIS — M25552 Pain in left hip: Secondary | ICD-10-CM

## 2016-10-14 DIAGNOSIS — M25551 Pain in right hip: Secondary | ICD-10-CM

## 2016-10-14 DIAGNOSIS — M4726 Other spondylosis with radiculopathy, lumbar region: Secondary | ICD-10-CM

## 2016-10-14 MED ORDER — GABAPENTIN 300 MG PO CAPS: ORAL_CAPSULE | ORAL | 1 refills | Status: DC

## 2016-10-14 NOTE — Patient Instructions (Signed)
We are ordering an MRI for you today.  The imaging office will be calling you to schedule your appointment after we obtain authorization from your insurance company.  Immediately after you schedule your appointment with them please call our office back ([phone]) to schedule a follow-up appointment with me.  This will need to be at least 24 hours after you have the test done to give radiologist and myself time to review the test.  We will discuss further treatment options at your follow up appointment.   

## 2016-10-14 NOTE — Progress Notes (Deleted)
   [name] [name] - 55 y.o. female MRN [id number]  Date of birth: 11/14/1961  Office Visit Note: Visit Date: 10/14/2016 PCP: Jilda Panda, MD Referred by: Jilda Panda, MD  Subjective: No chief complaint on file.  HPI: Patient states Bilateral hip pain for about 4 months.  Hurts to sleep at night.     ROS Otherwise per HPI.  Assessment & Plan: Visit Diagnoses: No diagnosis found.  Plan: No additional findings.  Patient Instructions:   {Plan narrative}               Meds & Orders: No orders of the defined types were placed in this encounter.  No orders of the defined types were placed in this encounter.   Follow-up: No Follow-up on file.   Procedures: No procedures performed  No notes on file   Clinical History: No specialty comments available.  She reports that she has been smoking Cigarettes.  She has a 40.00 pack-year smoking history. She has never used smokeless tobacco. No results for input(s): HGBA1C, LABURIC in the last 8760 hours.  Objective:  VS:  HT:    WT:   BMI:     BP:   HR: bpm  TEMP: ( )  RESP:  Physical Exam  Ortho Exam Imaging: No results found.  Past Medical/Family/Surgical/Social History: Medications & Allergies reviewed per EMR Patient Active Problem List   Diagnosis Date Noted  . Tobacco abuse 02/06/2016  . COPD with emphysema ([place]) 02/06/2016  . OBSTRUCTIVE CHRONIC BRONCHITIS WITHOUT EXACERBAT 12/01/2008  . 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 DEPRESSION 04/05/2008  . RESTRICTIVE LUNG DISEASE 04/05/2008  . GERD 04/05/2008  . DIVERTICULOSIS, COLON 04/05/2008  . IRRITABLE BOWEL SYNDROME 04/05/2008  . HEMORRHOIDS, HX OF 04/05/2008   Past Medical History:  Diagnosis Date  . Anxiety   . COPD (chronic obstructive pulmonary disease) ([place])   . Depression   . Osteoporosis   . Seasonal allergies   . Sickle cell trait ([facility])    Family History  Problem Relation Age of Onset  . Colon cancer Father 62  . Stomach cancer Neg Hx   . Throat cancer  Brother   . Heart disease Mother    Past Surgical History:  Procedure Laterality Date  . [place]   Social History   Occupational History  . Not on file.   Social History Main Topics  . Smoking status: Current Every Day Smoker    Packs/day: 1.00    Years: 40.00    Types: Cigarettes  . Smokeless tobacco: Never Used     Comment: down to .5ppd 02/06/2016  . Alcohol use 0.0 oz/week     Comment: occasional  . Drug use: No  . Sexual activity: Not on file

## 2016-10-14 NOTE — Progress Notes (Signed)
[name] [name] - 55 y.o. female MRN [id number]  Date of birth: 1961/05/06  Office Visit Note: Visit Date: 10/14/2016 PCP: Jilda Panda, MD Referred by: Jilda Panda, MD  Subjective: Chief Complaint  Patient presents with  . Right Hip - Pain  . Left Hip - Pain   HPI: Patient reports 6+ months of worsening left greater than right hip pain. She reports the pain along the lateral buttock radiating down the posterior aspect of her leg into the knee & foot. She has occasional giving way of her left leg. She has had an associated 15 pound weight loss over the past several months for unknown reasons. She does have frequent urinary issues but denies any significant changes of the past several weeks. She has daily nighttime awakenings with night sweats.  Pain does seem to be worse with any type of activity but also occurs at nighttime while lying still.    ROS Otherwise per HPI.  Assessment & Plan: Visit Diagnoses:  1. Hip pain, bilateral   2. Other spondylosis with radiculopathy, lumbar region     Plan: Findings:  Given persistent pain for greater than 4 months, recent 15 pound weight loss, & positive neural tension signs with abnormal findings on plain film x-ray further evaluation with MRI of the lumbar spine indicated at this time.  Follow-up after MRI obtained to discuss further intervention including potential referral to physiatry for epidural steroid injection versus surgical consultation.    Meds & Orders:  Meds ordered this encounter  Medications  . gabapentin (NEURONTIN) 300 MG capsule    Sig: Start with 1 tab po qhs X 1 week, then increase to 1 tab po bid X 1 week then 1 tab po prn    Dispense:  90 capsule    Refill:  1    Orders Placed This Encounter  Procedures  . XR HIP UNILAT W OR W/O PELVIS 2-3 VIEWS LEFT  . XR HIP UNILAT W OR W/O PELVIS 2-3 VIEWS RIGHT  . XR Lumbar Spine 2-3 Views  . MR Lumbar Spine w/o contrast    Follow-up: Return for MRI review.    Procedures: No procedures performed  No notes on file   Clinical History: No specialty comments available.  She reports that she has been smoking Cigarettes.  She has a 40.00 pack-year smoking history. She has never used smokeless tobacco. No results for input(s): HGBA1C, LABURIC in the last 8760 hours.  Objective:  VS:  HT:5' 4.5" (163.8 cm)   WT:138 lb (62.6 kg)  BMI:23.4    BP:(!) 148/91  HR:95bpm  TEMP: ( )  RESP:  Physical Exam  Constitutional: No distress.  HENT:  Head: Normocephalic and atraumatic.  Eyes: Right eye exhibits no discharge. Left eye exhibits no discharge. No scleral icterus.  Pulmonary/Chest: Effort normal. No respiratory distress.  Musculoskeletal:  Well aligned back. Pain within the left gluteal muscles as well as left greater sciatic notch. No significant pain over the greater trochanter. She has good internal & external rotation of bilateral hips. She has pain with straight leg raise on the left with rigidity with range of motion of the knee. She has slightly diminished reflexes bilateral Achilles & increased reflexes 2+/4 on the left knee with 1+/4 on the right. She has a slight dysthesia in the L4 on the left. Pain with palpation of the left popliteal space. No effusion.  Neurological: She is alert.  Appropriately interactive.  Skin: Skin is warm and dry. No rash noted. She is  not diaphoretic. No erythema. No pallor.  Psychiatric:  Slight echolalia on exam. Moderate insight.    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 She has lumbar spondylosis with an osseous irregularity of the left L5-S1 region with & abnormal radiolucency that is incompletely characterized on plain film x-rays. Multilevel degenerative changes with osteophytic spurring between L4-L5 & L5-S1.  Impression: Abnormal x-ray with abnormal radiolucency at L5-S1 as well as degenerative changes as above.   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 She has lumbar spondylosis with an osseous irregularity of the left L5-S1 region with & abnormal radiolucency that is incompletely characterized on plain film x-rays. Multilevel degenerative changes with osteophytic spurring between L4-L5 & L5-S1. Impression: Abnormal x-ray with abnormal radiolucency at L5-S1 as well as degenerative changes as above.   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 She has lumbar spondylosis with an osseous irregularity of the left L5-S1 region with & abnormal radiolucency that is incompletely characterized on plain film x-rays. Multilevel degenerative changes with osteophytic spurring between L4-L5 & L5-S1. Impression: Abnormal x-ray with abnormal radiolucency at L5-S1 as well as degenerative changes as above.    Past Medical/Family/Surgical/Social History: Medications & Allergies reviewed per EMR Patient Active Problem List   Diagnosis Date Noted  . Tobacco abuse 02/06/2016  . COPD with emphysema ([place]) 02/06/2016  . OBSTRUCTIVE CHRONIC BRONCHITIS WITHOUT EXACERBAT 12/01/2008  . 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 DEPRESSION 04/05/2008  . RESTRICTIVE LUNG DISEASE 04/05/2008  . GERD 04/05/2008  . DIVERTICULOSIS, COLON 04/05/2008  . IRRITABLE BOWEL SYNDROME 04/05/2008  . HEMORRHOIDS, HX OF 04/05/2008   Past Medical History:  Diagnosis Date  . Anxiety   . COPD (chronic obstructive pulmonary disease) ([place])   . Depression   . Osteoporosis   . Seasonal allergies   . Sickle cell trait ([facility])     Family History  Problem Relation Age of Onset  . Colon cancer Father 16  . Throat cancer Brother   . Heart disease Mother   . Stomach cancer Neg Hx    Past Surgical History:  Procedure Laterality Date  . [place]   Social History   Occupational History  . Not on file.   Social History Main Topics  . Smoking status: Current Every Day Smoker    Packs/day: 1.00    Years: 40.00    Types: Cigarettes  . Smokeless tobacco: Never Used     Comment: down to .5ppd 02/06/2016  . Alcohol use 0.0 oz/week     Comment: occasional  . Drug use: No  . Sexual activity: Not on file

## 2016-10-25 ENCOUNTER — Ambulatory Visit
Admission: RE | Admit: 2016-10-25 | Discharge: 2016-10-25 | Disposition: A | Discharge status: Home / Self Care | Payer: Medicare Other | Source: Ambulatory Visit | Attending: Sports Medicine | Admitting: Sports Medicine

## 2016-10-25 ENCOUNTER — Telehealth (INDEPENDENT_AMBULATORY_CARE_PROVIDER_SITE_OTHER): Payer: Self-pay | Admitting: Sports Medicine

## 2016-10-25 DIAGNOSIS — M25552 Pain in left hip: Principal | ICD-10-CM

## 2016-10-25 DIAGNOSIS — R9389 Abnormal findings on diagnostic imaging of other specified body structures: Secondary | ICD-10-CM

## 2016-10-25 DIAGNOSIS — M4726 Other spondylosis with radiculopathy, lumbar region: Secondary | ICD-10-CM

## 2016-10-25 DIAGNOSIS — M25551 Pain in right hip: Secondary | ICD-10-CM

## 2016-10-25 NOTE — Telephone Encounter (Signed)
Noted. I also called and left voicemail advising patient of message concerning further imaging.

## 2016-10-25 NOTE — Telephone Encounter (Signed)
Patient was called & voicemail was left that further testing needs to be obtained regarding the findings of MRI of her lumbar spine. CT of the chest abdomen & pelvis ordered without contrast. I would like for this to be performed over the weekend if possible.

## 2016-10-28 ENCOUNTER — Encounter (INDEPENDENT_AMBULATORY_CARE_PROVIDER_SITE_OTHER): Payer: Self-pay | Admitting: Sports Medicine

## 2016-10-28 ENCOUNTER — Ambulatory Visit (INDEPENDENT_AMBULATORY_CARE_PROVIDER_SITE_OTHER): Payer: Medicare Other | Admitting: Sports Medicine

## 2016-10-28 VITALS — BP 125/83 | HR 93 | Ht 64.5 in | Wt 138.0 lb

## 2016-10-28 DIAGNOSIS — R938 Abnormal findings on diagnostic imaging of other specified body structures: Secondary | ICD-10-CM

## 2016-10-28 DIAGNOSIS — R937 Abnormal findings on diagnostic imaging of other parts of musculoskeletal system: Secondary | ICD-10-CM | POA: Diagnosis not present

## 2016-10-28 DIAGNOSIS — R9389 Abnormal findings on diagnostic imaging of other specified body structures: Secondary | ICD-10-CM

## 2016-10-28 DIAGNOSIS — F17210 Nicotine dependence, cigarettes, uncomplicated: Secondary | ICD-10-CM

## 2016-10-28 DIAGNOSIS — Z720 Tobacco use: Secondary | ICD-10-CM

## 2016-10-28 DIAGNOSIS — J439 Emphysema, unspecified: Secondary | ICD-10-CM

## 2016-10-28 LAB — CBC WITH DIFFERENTIAL/PLATELET
BASOS ABS: 0 {cells}/uL (ref 0–200)
Basophils Relative: 0 %
EOS PCT: 2 %
Eosinophils Absolute: 146 cells/uL (ref 15–500)
HCT: 43.7 % (ref 35.0–45.0)
HEMOGLOBIN: 14.5 g/dL (ref 11.7–15.5)
LYMPHS ABS: 2920 {cells}/uL (ref 850–3900)
LYMPHS PCT: 40 %
MCH: 29.5 pg (ref 27.0–33.0)
MCHC: 33.2 g/dL (ref 32.0–36.0)
MCV: 88.8 fL (ref 80.0–100.0)
MONOS PCT: 10 %
MPV: 9.5 fL (ref 7.5–12.5)
Monocytes Absolute: 730 cells/uL (ref 200–950)
NEUTROS PCT: 48 %
Neutro Abs: 3504 cells/uL (ref 1500–7800)
Platelets: 273 10*3/uL (ref 140–400)
RBC: 4.92 MIL/uL (ref 3.80–5.10)
RDW: 15 % (ref 11.0–15.0)
WBC: 7.3 10*3/uL (ref 3.8–10.8)

## 2016-10-28 LAB — COMPREHENSIVE METABOLIC PANEL
ALBUMIN: 4.2 g/dL (ref 3.6–5.1)
ALT: 25 U/L (ref 6–29)
AST: 23 U/L (ref 10–35)
Alkaline Phosphatase: 73 U/L (ref 33–130)
BUN: 12 mg/dL (ref 7–25)
CHLORIDE: 100 mmol/L (ref 98–110)
CO2: 31 mmol/L (ref 20–31)
CREATININE: 0.75 mg/dL (ref 0.50–1.05)
Calcium: 9.6 mg/dL (ref 8.6–10.4)
Glucose, Bld: 82 mg/dL (ref 65–99)
Potassium: 4.5 mmol/L (ref 3.5–5.3)
SODIUM: 137 mmol/L (ref 135–146)
Total Bilirubin: 0.3 mg/dL (ref 0.2–1.2)
Total Protein: 7.6 g/dL (ref 6.1–8.1)

## 2016-10-28 NOTE — Addendum Note (Signed)
Addended by: Mal Misty L on: 10/28/2016 11:03 AM   Modules accepted: Orders

## 2016-10-28 NOTE — Patient Instructions (Signed)
Please go get your lab work completed We will be in touch about your CT scans

## 2016-10-28 NOTE — Progress Notes (Signed)
[name] [name] - 55 y.o. female MRN [id number]  Date of birth: Jan 26, 1961  Office Visit Note: Visit Date: 10/28/2016 PCP: Jilda Panda, MD Referred by: Jilda Panda, MD  Subjective: Chief Complaint  Patient presents with  . Right Hip - Follow-up  . Left Hip - Follow-up  . Follow-up    Here to discuss MRI results   HPI: Patient here for follow-up of low back pain. She is status post MRI as reviewed low. Unfortunately she has continued to have significant discomfort. She is reported worsening respiratory symptoms as well with worsening cough for the past several days. She denies any fevers or undue dyspnea. She is known COPD & is current everyday tobacco user.  ROS: Otherwise per HPI.  Assessment & Plan: Visit Diagnoses:  1. Abnormal MRI, lumbar spine   2. Tobacco abuse   3. Pulmonary emphysema, unspecified emphysema type ([place])     Plan: Further evaluation with CT of the chest abdomen pelvis ordered on Friday. Will ensure this is scheduled for early this week. Additionally we'll check lab work. We'll plan to follow up with her after the testing is performed to discuss referral to appropriate specialist. Meds: No orders of the defined types were placed in this encounter.  Follow-up: Return for review of CT scans once they are completed..   Clinical History: No specialty comments available.  She reports that she has been smoking Cigarettes.  She has a 40.00 pack-year smoking history. She has never used smokeless tobacco. No results for input(s): HGBA1C, LABURIC in the last 8760 hours.  Objective:  VS:  HT:5' 4.5" (163.8 cm)   WT:138 lb (62.6 kg)  BMI:23.4    BP:125/83  HR:93bpm  TEMP: ( )  RESP:  Physical Exam: Adult female. In no acute distress. Alert & appropriately interactive. No increased work of breathing. Insight is moderate. She has is able to walk without assistance. Imaging: Mr Lumbar Spine W/o Contrast  Result Date: 10/25/2016 CLINICAL DATA:  Bilateral hip pain.  Spondylosis with radiculopathy lumbar region. 15 pound weight loss EXAM: MRI LUMBAR SPINE WITHOUT CONTRAST TECHNIQUE: Multiplanar, multisequence MR imaging of the lumbar spine was performed. No intravenous contrast was administered. COMPARISON:  X-ray of the pelvis 10/14/2016 FINDINGS: Segmentation:  Normal Alignment:  Normal Vertebrae: 1 cm indeterminate lesion T11 vertebral body low signal on T1 and T2. Sclerotic lesion in the left L5 vertebral body measures approximately 10 x 15 mm and corresponds to an area of sclerosis on x-ray. This is well-defined and MRI does not extend in the pedicle. 8 x 21 mm lesion in the left iliac bone superiorly just lateral to the SI joint. This is heterogeneous in signal intensity and is low signal on T1 and mildly hyperintense on T2. Similar but smaller lesion in the right iliac crest superiorly measures 13 mm. These lesions are indeterminate. Review of the prior x-rays not reveal any bony destruction or sclerosis involving these 2 lesions. Conus medullaris: Extends to the L1-2 level and appears normal. Paraspinal and other soft tissues: Uterine fibroids noted measuring 20 mm and 15 mm. No retroperitoneal adenopathy. Abdominal aorta normal in caliber. Paraspinous muscles normal. Disc levels: L1-2:  Negative L2-3:  Mild disc and facet degeneration L3-4: Mild disc bulging and mild facet degeneration. Mild spinal stenosis. Neural foramina adequately patent L4-5: Mild spinal stenosis. Mild disc bulging and mild facet hypertrophy. L5-S1:  Mild facet hypertrophy without significant stenosis. IMPRESSION: Sclerotic 10 x 15 mm lesion in the left L5 vertebral body. This is well-defined and  could be a large bone island. Sclerotic metastatic disease considered less likely. There is also a possible second lesion in the T11 vertebral body. Lesions in the low iliac bone bilaterally do not appear sclerotic and are indeterminate. These are not typical for hemangiomata. Differential diagnosis  includes metastatic disease, myeloma, and benign bone lesion. No current history of malignancy. Further evaluation is warranted. Consider bone scan to evaluate for metabolic activity. Also consider CT chest abdomen pelvis to evaluate for malignancy and further characterize these bone lesions. Electronically Signed   By: Franchot Gallo M.D.   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 She has lumbar spondylosis with an osseous irregularity of the left L5-S1 region with & abnormal radiolucency that is incompletely characterized on plain film x-rays. Multilevel degenerative changes with osteophytic spurring between L4-L5 & L5-S1. Impression: Abnormal x-ray with abnormal radiolucency at L5-S1 as well as degenerative changes as above.   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 She has lumbar spondylosis with an osseous irregularity of the left L5-S1 region with & abnormal radiolucency that is incompletely characterized on plain film x-rays. Multilevel degenerative changes with osteophytic spurring between L4-L5 & L5-S1. Impression: Abnormal x-ray with abnormal radiolucency at L5-S1 as well as degenerative changes as above.   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 She has lumbar spondylosis with an osseous irregularity of the left L5-S1 region with & abnormal radiolucency that is incompletely characterized on plain film x-rays. Multilevel degenerative changes with osteophytic spurring between L4-L5  & L5-S1. Impression: Abnormal x-ray with abnormal radiolucency at L5-S1 as well as degenerative changes as above.

## 2016-10-29 LAB — URINALYSIS, ROUTINE W REFLEX MICROSCOPIC
Bilirubin Urine: NEGATIVE
GLUCOSE, UA: NEGATIVE
HGB URINE DIPSTICK: NEGATIVE
KETONES UR: NEGATIVE
LEUKOCYTES UA: NEGATIVE
NITRITE: NEGATIVE
PH: 5 (ref 5.0–8.0)
Protein, ur: NEGATIVE
SPECIFIC GRAVITY, URINE: 1.011 (ref 1.001–1.035)

## 2016-10-29 LAB — C-REACTIVE PROTEIN: CRP: 20.3 mg/L — ABNORMAL HIGH (ref <8.0)

## 2016-10-29 LAB — SEDIMENTATION RATE: SED RATE: 11 mm/h (ref 0–30)

## 2016-10-31 LAB — PROTEIN ELECTROPHORESIS, SERUM
ALBUMIN ELP: 3.9 g/dL (ref 3.8–4.8)
ALPHA-1-GLOBULIN: 0.5 g/dL — AB (ref 0.2–0.3)
ALPHA-2-GLOBULIN: 1.1 g/dL — AB (ref 0.5–0.9)
Beta 2: 0.5 g/dL (ref 0.2–0.5)
Beta Globulin: 0.4 g/dL (ref 0.4–0.6)
GAMMA GLOBULIN: 1.5 g/dL (ref 0.8–1.7)
Total Protein, Serum Electrophoresis: 7.9 g/dL (ref 6.1–8.1)

## 2016-11-05 ENCOUNTER — Other Ambulatory Visit: Payer: Self-pay | Admitting: Acute Care

## 2016-11-05 DIAGNOSIS — F17210 Nicotine dependence, cigarettes, uncomplicated: Secondary | ICD-10-CM

## 2016-11-06 ENCOUNTER — Ambulatory Visit
Admission: RE | Admit: 2016-11-06 | Discharge: 2016-11-06 | Disposition: A | Discharge status: Home / Self Care | Payer: Medicare Other | Source: Ambulatory Visit | Attending: Sports Medicine | Admitting: Sports Medicine

## 2016-11-06 DIAGNOSIS — R9389 Abnormal findings on diagnostic imaging of other specified body structures: Secondary | ICD-10-CM

## 2016-11-11 ENCOUNTER — Ambulatory Visit: Payer: Medicare Other | Admitting: Pulmonary Disease

## 2016-11-13 ENCOUNTER — Telehealth (INDEPENDENT_AMBULATORY_CARE_PROVIDER_SITE_OTHER): Payer: Self-pay | Admitting: Radiology

## 2016-11-13 NOTE — Telephone Encounter (Signed)
I called patient and spoke with her over the phone to advise she needs to come in office for follow up visit to review scan results .She is currently in the hospital with her mother and states she will have to call us back to schedule this appointment.

## 2016-11-13 NOTE — Telephone Encounter (Signed)
-----   Message from Gerda Diss, DO sent at 11/12/2016  3:37 PM EST ----- Patient had a follow-up appointment scheduled at one point but does not seem to currently.  Please schedule a follow-up appointment with her as soon as possible to review these results.

## 2016-11-16 ENCOUNTER — Other Ambulatory Visit: Payer: Self-pay | Admitting: Pulmonary Disease

## 2016-11-25 ENCOUNTER — Encounter (HOSPITAL_COMMUNITY): Payer: Self-pay | Admitting: Emergency Medicine

## 2016-11-25 DIAGNOSIS — J449 Chronic obstructive pulmonary disease, unspecified: Secondary | ICD-10-CM | POA: Insufficient documentation

## 2016-11-25 DIAGNOSIS — Z79899 Other long term (current) drug therapy: Secondary | ICD-10-CM | POA: Diagnosis not present

## 2016-11-25 DIAGNOSIS — N764 Abscess of vulva: Secondary | ICD-10-CM | POA: Insufficient documentation

## 2016-11-25 DIAGNOSIS — F17210 Nicotine dependence, cigarettes, uncomplicated: Secondary | ICD-10-CM | POA: Diagnosis not present

## 2016-11-25 NOTE — ED Triage Notes (Signed)
Pt. reports left labial abscess with no drainage onset Sunday , denies fever or chills.

## 2016-11-26 ENCOUNTER — Emergency Department (HOSPITAL_COMMUNITY)
Admission: EM | Admit: 2016-11-26 | Discharge: 2016-11-26 | Disposition: A | Discharge status: Home / Self Care | Payer: Medicare Other | Attending: Emergency Medicine | Admitting: Emergency Medicine

## 2016-11-26 DIAGNOSIS — N764 Abscess of vulva: Secondary | ICD-10-CM | POA: Diagnosis not present

## 2016-11-26 DIAGNOSIS — L0291 Cutaneous abscess, unspecified: Secondary | ICD-10-CM

## 2016-11-26 MED ORDER — LIDOCAINE-EPINEPHRINE (PF) 2 %-1:200000 IJ SOLN
10.0000 mL | Freq: Once | INTRAMUSCULAR | Status: AC
  Administered 2016-11-26: 10 mL
  Filled 2016-11-26: qty 20

## 2016-11-26 MED ORDER — CEPHALEXIN 500 MG PO CAPS: 500.0000 mg | ORAL_CAPSULE | Freq: Two times a day (BID) | ORAL | 0 refills | Status: DC

## 2016-11-26 NOTE — ED Provider Notes (Signed)
[facility] DEPT Provider Note   CSN: [id number] Arrival date & time: 11/25/16  2316     History   Chief Complaint Chief Complaint  Patient presents with  . Abscess    HPI SHAILAH [name] [name] is a 55 y.o. female.  HPI   Patient is a 55 year old female with history of COPD, anxiety and depression who presents to the ED with complaint of abscess, onset 3 days. Patient reports having worsening pain and swelling to her left labia majora. She states she has been doing Epsom soaks at home without improvement of symptoms. Denies drainage. Reports having abscesses to her regular in the past. Denies any recent antibiotic use. Denies fever, chills, abdominal pain, nausea, vomiting, diarrhea, urinary symptoms, vaginal bleeding, vaginal discharge. Denies taking any medications at home for her symptoms.   Past Medical History:  Diagnosis Date  . Anxiety   . COPD (chronic obstructive pulmonary disease) ([place])   . Depression   . Osteoporosis   . Seasonal allergies   . Sickle cell trait [facility])     Patient Active Problem List   Diagnosis Date Noted  . Tobacco abuse 02/06/2016  . COPD with emphysema ([place]) 02/06/2016  . OBSTRUCTIVE CHRONIC BRONCHITIS WITHOUT EXACERBAT 12/01/2008  . 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 DEPRESSION 04/05/2008  . RESTRICTIVE LUNG DISEASE 04/05/2008  . GERD 04/05/2008  . DIVERTICULOSIS, COLON 04/05/2008  . IRRITABLE BOWEL SYNDROME 04/05/2008  . HEMORRHOIDS, HX OF 04/05/2008    Past Surgical History:  Procedure Laterality Date  . CESAREAN SECTION  1980    OB History    No data available       Home Medications    Prior to Admission medications   Medication Sig Start Date End Date Taking? Authorizing Provider  albuterol (PROVENTIL HFA) 108 (90 BASE) MCG/ACT inhaler Inhale 1 puff into the lungs every 6 (six) hours as needed for wheezing or shortness of breath.  03/29/14   Historical Provider, MD  albuterol (PROVENTIL) (2.5 MG/3ML) 0.083% nebulizer solution  Take 3 mLs (2.5 mg total) by nebulization every 6 (six) hours as needed for wheezing or shortness of breath. 02/06/16   Juanito Doom, MD  buPROPion (WELLBUTRIN SR) 100 MG 12 hr tablet Take 100 mg by mouth 2 (two) times daily.      Historical Provider, MD  cephALEXin (KEFLEX) 500 MG capsule Take 1 capsule (500 mg total) by mouth 2 (two) times daily. 11/26/16   Nona Dell, PA-C  gabapentin (NEURONTIN) 300 MG capsule Start with 1 tab po qhs X 1 week, then increase to 1 tab po bid X 1 week then 1 tab po prn 10/14/16   Gerda Diss, DO  Multiple Vitamin (MULTIVITAMIN) tablet Take 1 tablet by mouth daily.      Historical Provider, MD  raloxifene (EVISTA) 60 MG tablet Take 60 mg by mouth daily.      Historical Provider, MD  SPIRIVA HANDIHALER 18 MCG inhalation capsule PLACE 1 CAPSULE INTO INHALER AND INHALE ONCE DAILY 11/18/16   Juanito Doom, MD    Family History Family History  Problem Relation Age of Onset  . Colon cancer Father 22  . Throat cancer Brother   . Heart disease Mother   . Stomach cancer Neg Hx     Social History Social History  Substance Use Topics  . Smoking status: Current Every Day Smoker    Packs/day: 1.00    Years: 40.00    Types: Cigarettes  . Smokeless tobacco: Never Used  Comment: down to .5ppd 02/06/2016  . Alcohol use 0.0 oz/week     Comment: occasional     Allergies   Patient has no known allergies.   Review of Systems Review of Systems  Skin:       abscess  All other systems reviewed and are negative.    Physical Exam Updated Vital Signs BP 138/94   Pulse 106   Temp 98.3 F (36.8 C) (Oral)   Resp 20   Ht 5' 4.5" (1.638 m)   Wt 61.7 kg   SpO2 95%   BMI 22.98 kg/m   Physical Exam  Constitutional: She is oriented to person, place, and time. She appears well-developed and well-nourished. No distress.  HENT:  Head: Normocephalic and atraumatic.  Eyes: Conjunctivae and EOM are normal. Right eye exhibits no discharge.  Left eye exhibits no discharge. No scleral icterus.  Neck: Normal range of motion. Neck supple.  Cardiovascular: Normal rate and intact distal pulses.   Pulmonary/Chest: Effort normal and breath sounds normal.  Abdominal: Soft. Bowel sounds are normal. She exhibits no distension and no mass. There is no tenderness. There is no rebound and no guarding. No hernia. Hernia confirmed negative in the right inguinal area and confirmed negative in the left inguinal area.  Genitourinary:    No labial fusion. There is no rash, tenderness, lesion or injury on the right labia. There is tenderness on the left labia. There is no rash, lesion or injury on the left labia.  Musculoskeletal: She exhibits no edema.  Lymphadenopathy:       Right: No inguinal adenopathy present.       Left: No inguinal adenopathy present.  Neurological: She is alert and oriented to person, place, and time.  Skin: Skin is warm and dry. She is not diaphoretic.  Nursing note and vitals reviewed.    ED Treatments / Results  Labs (all labs ordered are listed, but only abnormal results are displayed) Labs Reviewed - No data to display  EKG  EKG Interpretation None       Radiology No results found.  Procedures .Marland KitchenIncision and Drainage Date/Time: 11/26/2016 4:56 AM Performed by: Nona Dell Authorized by: Nona Dell   Consent:    Consent obtained:  Verbal   Consent given by:  Patient Location:    Type:  [place] cyst   Size:  3x1cm   Location: left. Pre-procedure details:    Skin preparation:  Betadine Anesthesia (see MAR for exact dosages):    Anesthesia method:  Local infiltration   Local anesthetic:  Lidocaine 2% WITH epi Procedure type:    Complexity:  Simple Procedure details:    Incision types:  Single straight   Incision depth:  Dermal   Scalpel blade:  11   Wound management:  Probed and deloculated and irrigated with saline   Drainage:  Purulent and bloody   Drainage  amount:  Scant   Wound treatment:  Wound left open   Packing materials:  None Post-procedure details:    Patient tolerance of procedure:  Tolerated well, no immediate complications   (including critical care time)  Medications Ordered in ED Medications  lidocaine-EPINEPHrine (XYLOCAINE W/EPI) 2 %-1:200000 (PF) injection 10 mL (10 mLs Infiltration Given 11/26/16 0407)     Initial Impression / Assessment and Plan / ED Course  I have reviewed the triage vital signs and the nursing notes.  Pertinent labs & imaging results that were available during my care of the patient were reviewed by me  and considered in my medical decision making (see chart for details).  Clinical Course     Patient presents with abscess to left labia majora that has been present and gradually worsening over the past 3 days. Denies fever, abdominal pain, vomiting, vaginal discharge, urinary symptoms. Reports history of abscesses in the past, denies any recent antibiotic use. VSS. Exam revealed area of induration and swelling noted to left labia majora, no evidence of associated cellulitis. I&D performed, only small amount of purulent drainage produced. Word catheter was not placed due to only small abscess present with more surrounding soft tissue swelling noted. Due to pt with hx of abscesses, will d/c home with abx. Discussed symptomatically treatment with patient. Advised patient to follow up with PCP this week for recheck. Discussed return precautions.  Final Clinical Impressions(s) / ED Diagnoses   Final diagnoses:  Abscess    New Prescriptions New Prescriptions   CEPHALEXIN (KEFLEX) 500 MG CAPSULE    Take 1 capsule (500 mg total) by mouth 2 (two) times daily.     Chesley Noon [place], [place] 11/26/16 0510    Merryl Hacker, MD 11/26/16 [phone]

## 2016-11-26 NOTE — Discharge Instructions (Signed)
Take antibiotic as prescribed until completed. Recommend continuing to apply warm compresses to the area for 15-20 minutes 3-4 times daily.  Follow-up with your primary care provider in 3-4 days for wound recheck. Please return to the Emergency Department if symptoms worsen or new onset of fever, redness, swelling, warmth, abdominal pain, vomiting, unable to keep fluids down.

## 2016-11-27 ENCOUNTER — Telehealth (INDEPENDENT_AMBULATORY_CARE_PROVIDER_SITE_OTHER): Payer: Self-pay

## 2016-11-27 NOTE — Progress Notes (Signed)
Talked with patient and advised her that a follow-up visit was needed to go over CT results.  Patient stated that she would prefer a phone call with the results.  Message sent to Dr. Paulla Fore

## 2016-11-27 NOTE — Telephone Encounter (Signed)
-----   Message from Gerda Diss, DO sent at 11/12/2016  3:37 PM EST ----- Patient had a follow-up appointment scheduled at one point but does not seem to currently.  Please schedule a follow-up appointment with her as soon as possible to review these results.

## 2016-11-27 NOTE — Telephone Encounter (Signed)
Talked with patient to schedule a follow- up to go over CT results, but patient prefers a phone call for the results. Thank You

## 2016-11-28 ENCOUNTER — Encounter (INDEPENDENT_AMBULATORY_CARE_PROVIDER_SITE_OTHER): Payer: Self-pay | Admitting: Sports Medicine

## 2016-11-28 ENCOUNTER — Ambulatory Visit (INDEPENDENT_AMBULATORY_CARE_PROVIDER_SITE_OTHER): Payer: Medicare Other | Admitting: Sports Medicine

## 2016-11-28 ENCOUNTER — Telehealth: Payer: Self-pay | Admitting: Pulmonary Disease

## 2016-11-28 VITALS — BP 123/74 | HR 96 | Ht 64.5 in | Wt 136.0 lb

## 2016-11-28 DIAGNOSIS — R938 Abnormal findings on diagnostic imaging of other specified body structures: Secondary | ICD-10-CM | POA: Diagnosis not present

## 2016-11-28 DIAGNOSIS — F17210 Nicotine dependence, cigarettes, uncomplicated: Secondary | ICD-10-CM

## 2016-11-28 DIAGNOSIS — R937 Abnormal findings on diagnostic imaging of other parts of musculoskeletal system: Secondary | ICD-10-CM

## 2016-11-28 DIAGNOSIS — Z720 Tobacco use: Secondary | ICD-10-CM

## 2016-11-28 DIAGNOSIS — K635 Polyp of colon: Secondary | ICD-10-CM | POA: Diagnosis not present

## 2016-11-28 DIAGNOSIS — J439 Emphysema, unspecified: Secondary | ICD-10-CM

## 2016-11-28 DIAGNOSIS — R9389 Abnormal findings on diagnostic imaging of other specified body structures: Secondary | ICD-10-CM

## 2016-11-28 NOTE — Patient Instructions (Signed)
I am transferring practices as of January 1st  to [facility] at [place].  This is a great opportunity & I am saddened to be leaving [facility] however & excited for new opportunities. I will continue to be seeing patients at [facility] through the end of December. I am happy to see you at the new location but also am confident that you are in great hands with the excellent providers here at [facility].  We are not currently scheduling patients at the new location at this time but if you look on [facility]'s website a contact information should be available there closer to January. Additionally [email or web address] will have information when it becomes available.    The telephone number will be [phone]  - Nobody will be answering this phone number until closer to January. 

## 2016-11-28 NOTE — Telephone Encounter (Signed)
Patient was scheduled for an appointment today. Case discussed and please see office visit note.

## 2016-11-28 NOTE — Telephone Encounter (Signed)
[id number] x1 

## 2016-11-28 NOTE — Progress Notes (Signed)
[name] [name] - 55 y.o. female MRN [id number]  Date of birth: 14-Feb-1961  Office Visit Note: Visit Date: 11/28/2016 PCP: Jilda Panda, MD Referred by: Jilda Panda, MD  Subjective: Chief Complaint  Patient presents with  . Follow-up    Patient is here to discuss CT results and blood work results.   HPI: Patient here for follow-up of lab results & to review CT of the chest abdomen pelvis is reviewed below. She reports that the prior bilateral hip pain that she's been having is somewhat improved & no longer having any back pain. She has had difficulty returning to clinic due to her mother being in the hospital. Otherwise she denies any new or different changes in respiratory status although she does have severe COPD & is followed closely by Dr. Lake Bells. ROS: No changes in bowel or bladder. Denies any hematochezia or melena. She is overdue for repeat screening colonoscopy.. Otherwise per HPI.   Clinical History: No specialty comments available.  She reports that she has been smoking Cigarettes.  She has a 40.00 pack-year smoking history. She has never used smokeless tobacco.  No results for input(s): HGBA1C, LABURIC in the last 8760 hours.  Assessment & Plan: Visit Diagnoses:    ICD-9-CM ICD-10-CM   1. Polyp of colon, unspecified part of colon, unspecified type 211.3 K63.5 Ambulatory referral to Gastroenterology  2. Abnormal MRI, lumbar spine 793.7 R93.7 NM PET Image Initial (PI) Skull Base To Thigh  3. Pulmonary emphysema, unspecified emphysema type (HCC) 492.8 J43.9 NM PET Image Initial (PI) Skull Base To Thigh  4. Moderate smoker (20 or less per day) 305.1 F17.210   5. Abnormal MRI 793.99 R93.8 NM PET Image Initial (PI) Skull Base To Thigh  6. Tobacco abuse 305.1 Z72.0 NM PET Image Initial (PI) Skull Base To Thigh    Plan:  I am very concerned for potential occult malignancy especially given her CRP of 20, & new sclerotic lesion within the pelvis. >50% of this 25 minute visit spent  in direct patient counseling and/or coordination of care. Discussion was focused on education regarding the in discussing the pathoetiology and anticipated clinical course of the above condition.  After discussing the case with Dr. Lake Bells as well as with Dr. Earleen Newport PET CT scan of neck to thigh will be ordered. She will follow-up with Dr. Lake Bells for consideration of the bronchoscopy with PFTs prior to intervention.  Additionally I have placed a referral to Dr. Henrene Pastor, her gastroenterologist, for repeat screening colonoscopy.  She did have polyps on her last screening colonoscopy which was 5 years ago & repeat was recommended at 5 years. I discussed with the patient that I am moving clinics & will be opening the new [facility] on [facility] in that further management will need to be directed by her current care team. Dr. Lake Bells is aware of the case & has already started the process to get her scheduled for follow-up.  Follow-up: Return if symptoms worsen or fail to improve.  Meds: No orders of the defined types were placed in this encounter.  Procedures: No notes on file   Objective:  VS:  HT:5' 4.5" (163.8 cm)   WT:136 lb (61.7 kg)  BMI:23    BP:123/74  HR:96bpm  TEMP: ( )  RESP:  Physical Exam:  Adult female. In no acute distress. Alert & appropriate. Not wearing oxygen. No increased work of breathing. Insight is moderate. Patient walks normally with no significant limp. Lower extremity  strength is grossly intact. Imaging: Ct Abdomen Pelvis Wo Contrast  Result Date: 11/06/2016 CLINICAL DATA:  55 year old female with prior abnormal MRI and shortness of breath. Weight loss of 15 pounds. EXAM: CT CHEST, ABDOMEN AND PELVIS WITHOUT CONTRAST TECHNIQUE: Multidetector CT imaging of the chest, abdomen and pelvis was performed following the standard protocol without IV contrast. COMPARISON:  CT chest 05/27/2016, 01/23/2012, CT 04/28/2007 FINDINGS: CT CHEST  Chest Wall: Superficial soft tissues unremarkable. Small axillary lymph nodes, not enlarged by CT size criteria more numerous on the right. Thoracic Inlet: Unremarkable appearance of the thoracic inlet. Unremarkable thyroid. Mediastinum: No significant mediastinal adenopathy. No hiatal hernia. Heart: Heart not enlarged. No pericardial fluid/ thickening. No significant coronary calcifications. Vasculature: Vasculature not well evaluated given the absence of intravenous contrast. No periaortic fluid. No aneurysm. Minimal calcifications of the aortic arch and descending thoracic aorta. Lungs: Again demonstrated is severe emphysema with bullous changes of the right greater than left apices, paraseptal and centrilobular distribution. As was present on the comparison CT there is complete collapse of the right middle lobe. Nodule present and what appears to be the anterior segment of the right upper lobe present on the comparison and measuring approximately 9 mm. Re- demonstration of right lower lobe architectural distortion/atelectasis. Re- demonstration of pleural nodularity at the periphery of the right lower lobe No defined nodule of the left upper lobe. Small nodule of the left lower lobe at the posterior periphery measures 4 mm, unchanged from the CT of 05/27/2016. Pleural nodularity present of the left lower lobe, unchanged. Musculoskeletal: Sclerotic focus of the posterior elements of the T5 vertebral body, more pronounced than the comparison. CT ABDOMEN AND PELVIS Hepatobiliary: No focal liver abnormality is seen. No gallstones, gallbladder wall thickening, or biliary dilatation.Unremarkable gallbladder. Pancreas: No peripancreatic fluid or inflammatory changes. Spleen: Normal in size without focal abnormality. Adrenals/Urinary Tract: Right adrenal gland unremarkable.  Left adrenal gland unremarkable. Right kidney: No right-sided hydronephrosis. No nephrolithiasis. Left Kidney: No left-sided hydronephrosis or  nephrolithiasis. There is no evidence of left or right perinephric stranding. Course of the bilateral ureters unremarkable. Unremarkable appearance of the urinary bladder. Stomach/Bowel: Unremarkable appearance of stomach with retained enteric contrast. No abnormally distended small bowel or colon. Multiple colonic diverticula without evidence of acute diverticulitis. Vascular/Lymphatic: Scattered calcifications of the abdominal aorta. No aneurysm. No periaortic fluid. Reproductive: Unremarkable appearance of the uterus. There is soft tissue nodule measuring 3.3 cm in the left pelvis interposed between the colon and the left adnexa. Other: No abdominal wall hernia. Musculoskeletal: Dense sclerotic focus of the L5 vertebral body, present on the prior MRI, was present also on CT dated 04/28/2007. Sclerotic focus within the right iliac bone was also present in 2008. There is, however, a new subtle sclerotic focus of the left iliac bone which was not present on the comparison. Degenerative changes of the spine. No displaced fracture. Remote fracture of right-sided lower rib angle now healed. IMPRESSION: No acute finding of the chest, abdomen, pelvis. There is a pattern of both new and persisting sclerotic bone lesions, with 2 new lesions identified in the posterior elements of T5 and in the left iliac bone. Given that there are sclerotic lesions which have been present in L5, right pelvis, and vertebral elements since the CT of 2008, the new lesions may reflect progression of known prior malignancy, or alternatively new metastatic disease. Correlation with patient presentation/ history may be useful, as well as consideration of nuclear medicine bone scan. Advanced emphysema again demonstrated with complete  collapse of the right middle lobe and scarring/ nodularity of the bilateral lungs and pleura, as above. If an occult lung cancer is considered as a possible source of the new sclerotic lesions of the pelvis and spine,  correlation with PET-CT may be useful or alternatively contrast-enhanced chest CT. Aortic atherosclerosis Signed, Dulcy Fanny. Earleen Newport, DO [facility] Radiology Electronically Signed   By: Corrie Mckusick D.O.   On: 11/06/2016 16:37   Ct Chest Wo Contrast  Result Date: 11/06/2016 CLINICAL DATA:  55 year old female with prior abnormal MRI and shortness of breath. Weight loss of 15 pounds. EXAM: CT CHEST, ABDOMEN AND PELVIS WITHOUT CONTRAST TECHNIQUE: Multidetector CT imaging of the chest, abdomen and pelvis was performed following the standard protocol without IV contrast. COMPARISON:  CT chest 05/27/2016, 01/23/2012, CT 04/28/2007 FINDINGS: CT CHEST Chest Wall: Superficial soft tissues unremarkable. Small axillary lymph nodes, not enlarged by CT size criteria more numerous on the right. Thoracic Inlet: Unremarkable appearance of the thoracic inlet. Unremarkable thyroid. Mediastinum: No significant mediastinal adenopathy. No hiatal hernia. Heart: Heart not enlarged. No pericardial fluid/ thickening. No significant coronary calcifications. Vasculature: Vasculature not well evaluated given the absence of intravenous contrast. No periaortic fluid. No aneurysm. Minimal calcifications of the aortic arch and descending thoracic aorta. Lungs: Again demonstrated is severe emphysema with bullous changes of the right greater than left apices, paraseptal and centrilobular distribution. As was present on the comparison CT there is complete collapse of the right middle lobe. Nodule present and what appears to be the anterior segment of the right upper lobe present on the comparison and measuring approximately 9 mm. Re- demonstration of right lower lobe architectural distortion/atelectasis. Re- demonstration of pleural nodularity at the periphery of the right lower lobe No defined nodule of the left upper lobe. Small nodule of the left lower lobe at the posterior periphery measures 4  mm, unchanged from the CT of 05/27/2016. Pleural nodularity present of the left lower lobe, unchanged. Musculoskeletal: Sclerotic focus of the posterior elements of the T5 vertebral body, more pronounced than the comparison. CT ABDOMEN AND PELVIS Hepatobiliary: No focal liver abnormality is seen. No gallstones, gallbladder wall thickening, or biliary dilatation.Unremarkable gallbladder. Pancreas: No peripancreatic fluid or inflammatory changes. Spleen: Normal in size without focal abnormality. Adrenals/Urinary Tract: Right adrenal gland unremarkable.  Left adrenal gland unremarkable. Right kidney: No right-sided hydronephrosis. No nephrolithiasis. Left Kidney: No left-sided hydronephrosis or nephrolithiasis. There is no evidence of left or right perinephric stranding. Course of the bilateral ureters unremarkable. Unremarkable appearance of the urinary bladder. Stomach/Bowel: Unremarkable appearance of stomach with retained enteric contrast. No abnormally distended small bowel or colon. Multiple colonic diverticula without evidence of acute diverticulitis. Vascular/Lymphatic: Scattered calcifications of the abdominal aorta. No aneurysm. No periaortic fluid. Reproductive: Unremarkable appearance of the uterus. There is soft tissue nodule measuring 3.3 cm in the left pelvis interposed between the colon and the left adnexa. Other: No abdominal wall hernia. Musculoskeletal: Dense sclerotic focus of the L5 vertebral body, present on the prior MRI, was present also on CT dated 04/28/2007. Sclerotic focus within the right iliac bone was also present in 2008. There is, however, a new subtle sclerotic focus of the left iliac bone which was not present on the comparison. Degenerative changes of the spine. No displaced fracture. Remote fracture of right-sided lower rib angle now healed. IMPRESSION: No acute finding of the chest, abdomen, pelvis. There is a pattern of both new and persisting sclerotic bone lesions, with 2 new  lesions identified in the posterior elements of T5 and in the left iliac bone. Given that there are sclerotic lesions which have been present in L5, right pelvis, and vertebral elements since the CT of 2008, the new lesions may reflect progression of known prior malignancy, or alternatively new metastatic disease. Correlation with patient presentation/ history may be useful, as well as consideration of nuclear medicine bone scan. Advanced emphysema again demonstrated with complete collapse of the right middle lobe and scarring/ nodularity of the bilateral lungs and pleura, as above. If an occult lung cancer is considered as a possible source of the new sclerotic lesions of the pelvis and spine, correlation with PET-CT may be useful or alternatively contrast-enhanced chest CT. Aortic atherosclerosis Signed, Dulcy Fanny. Earleen Newport, DO [facility] Radiology Electronically Signed   By: Corrie Mckusick D.O.   On: 11/06/2016 16:37    Past Medical/Family/Surgical/Social History: Medications & Allergies reviewed per EMR Patient Active Problem List   Diagnosis Date Noted  . Tobacco abuse 02/06/2016  . COPD with emphysema ([place]) 02/06/2016  . OBSTRUCTIVE CHRONIC BRONCHITIS WITHOUT EXACERBAT 12/01/2008  . 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 DEPRESSION 04/05/2008  . RESTRICTIVE LUNG DISEASE 04/05/2008  . GERD 04/05/2008  . DIVERTICULOSIS, COLON 04/05/2008  . IRRITABLE BOWEL SYNDROME 04/05/2008  . HEMORRHOIDS, HX OF 04/05/2008   Past Medical History:  Diagnosis Date  . Anxiety   . COPD (chronic obstructive pulmonary disease) ([place])   . Depression   . Osteoporosis   . Seasonal allergies   . Sickle cell trait (HCC)    Family History  Problem Relation Age of Onset  . Colon cancer Father 28  . Throat cancer Brother   . Heart disease Mother   . Stomach cancer Neg Hx    Past Surgical History:  Procedure Laterality Date  . [place]   Social History     Occupational History  . Not on file.   Social History Main Topics  . Smoking status: Current Every Day Smoker    Packs/day: 1.00    Years: 40.00    Types: Cigarettes  . Smokeless tobacco: Never Used     Comment: down to .5ppd 02/06/2016  . Alcohol use 0.0 oz/week     Comment: occasional  . Drug use: No  . Sexual activity: Not on file

## 2016-11-28 NOTE — Telephone Encounter (Signed)
Case discussed with Dr. Paulla Fore.  [name] [name] developed some radicular pain and was found to have multiple sclerotic lesions so she underwent a CT chest/abdomen/pelvis which showed multiple metastatic lesions and right middle lobe collapse.    I recommended a PET CT and f/u with [place].  She may need a bronchoscopy, but she would need a repeat PFT prior. Will have her come see [place] so we can help coordinate this.  Triage: please ensure a f/u appointment with either me or an NP in 2 weeks. thanks

## 2016-11-29 ENCOUNTER — Telehealth: Payer: Self-pay | Admitting: Pulmonary Disease

## 2016-11-29 NOTE — Telephone Encounter (Signed)
Per BQ, he wanted this pt. To have an appointment, spoke with the pt. About made her aware about BQ message.  An appointment was set up. At this time,nothing further is needed.   Penne Lash, CMA  Juanito Doom, MD      11/28/16 1:58 PM  Note    Case discussed with Dr. Paulla Fore.  Ms Platt developed some radicular pain and was found to have multiple sclerotic lesions so she underwent a CT chest/abdomen/pelvis which showed multiple metastatic lesions and right middle lobe collapse.    I recommended a PET CT and f/u with [place].  She may need a bronchoscopy, but she would need a repeat PFT prior. Will have her come see [place] so we can help coordinate this.  Triage: please ensure a f/u appointment with either me or an NP in 2 weeks. thanks

## 2016-12-11 ENCOUNTER — Ambulatory Visit (INDEPENDENT_AMBULATORY_CARE_PROVIDER_SITE_OTHER): Payer: Medicare Other | Admitting: Adult Health

## 2016-12-11 ENCOUNTER — Encounter: Payer: Self-pay | Admitting: Adult Health

## 2016-12-11 DIAGNOSIS — Z720 Tobacco use: Secondary | ICD-10-CM | POA: Diagnosis not present

## 2016-12-11 DIAGNOSIS — J439 Emphysema, unspecified: Secondary | ICD-10-CM

## 2016-12-11 DIAGNOSIS — R938 Abnormal findings on diagnostic imaging of other specified body structures: Secondary | ICD-10-CM

## 2016-12-11 DIAGNOSIS — R9389 Abnormal findings on diagnostic imaging of other specified body structures: Secondary | ICD-10-CM | POA: Insufficient documentation

## 2016-12-11 MED ORDER — ALBUTEROL SULFATE HFA 108 (90 BASE) MCG/ACT IN AERS: 1.0000 | INHALATION_SPRAY | Freq: Four times a day (QID) | RESPIRATORY_TRACT | 5 refills | Status: DC | PRN

## 2016-12-11 NOTE — Patient Instructions (Signed)
Continue on Spiriva daily  Work on not smoking .  Follow up Dr. Lake Bells  6 months and As needed

## 2016-12-11 NOTE — Assessment & Plan Note (Signed)
Stable on Spiriva  Flu shot is utd Will check with PCP for PVX date   Plan  Patient Instructions  Continue on Spiriva daily  Work on not smoking .  Follow up Dr. Lake Bells  6 months and As needed

## 2016-12-11 NOTE — Assessment & Plan Note (Signed)
Smoking cessation  

## 2016-12-11 NOTE — Progress Notes (Signed)
@Patient  ID: [name] [name], female    DOB: 26-Apr-1961, 55 y.o.   MRN: [id number]  Chief Complaint  Patient presents with  . Follow-up    COPD     Referring provider: Jilda Panda, MD  HPI: 55 year old female, smoker followed for severe COPD with emphysema and chronic hypoxic respiratory failure on 2 L of oxygen at bedtime  TEST /Events 2016 pulmonary function testing from [facility] showed FEV1 of 870 mL (37% predicted) consistent with severe airflow obstruction 2015 CT scan from [facility] showed severe panlobular emphysema She is prescribed 2 L O2 qHS and she sometimes uses a portable tank. She continues to smoke cigarettes now. She quit smoking for one year in 2013 with Chantix. She tried taking it again when she started back but it didn't help. She smokes 1/2 ppd as of 2017 but she thinks she smoked more. She started smoking at age 33.   12/11/2016 Follow up : COPD /Smoker  Pt returns for a 3 month follow up . Says overall she is doing about the same , with no flare of cough or wheezing . Changed from [facility] to Spiriva last ov. Did not like ANORO or Stiolto. Gets winded with walking long ways or up hill.   She remains on O2 At bedtime  . Feels it helps. Does not wear it each night   Still smoking , discussed smoking cessation   Pt recently had hip pain that led to xray, MRI , CT .  MRI showed a sclerotic 10 x 15 mm lesion in the left L5 vertebral body sHe was set up for CT chest and abdomen and pelvis.  CT chest showed similar findings, as previous CT chest in June 2017 with severe emphysema with bullous changes of the right greater than left apices. Stable nodules And complete  collapse of the right middle lobe and scarring.  CT chest in June 2017 showed multiple pulmonary nodules that have been unchanged since February 2013. She had severe emphysema. He has been set up for a PET scan next month.  No Known Allergies  Immunization History  Administered Date(s) Administered  .  Influenza Split 12/06/2015  . Influenza,inj,Quad PF,36+ Mos 08/27/2016    Past Medical History:  Diagnosis Date  . Anxiety   . COPD (chronic obstructive pulmonary disease) ([place])   . Depression   . Osteoporosis   . Seasonal allergies   . Sickle cell trait (HCC)     Tobacco History: History  Smoking Status  . Current Every Day Smoker  . Packs/day: 1.00  . Years: 40.00  . Types: Cigarettes  Smokeless Tobacco  . Never Used    Comment: down to .5ppd 02/06/2016   Ready to quit: No Counseling given: Yes   Outpatient Encounter Prescriptions as of 12/11/2016  Medication Sig  . albuterol (PROVENTIL) (2.5 MG/3ML) 0.083% nebulizer solution Take 3 mLs (2.5 mg total) by nebulization every 6 (six) hours as needed for wheezing or shortness of breath.  Marland Kitchen buPROPion (WELLBUTRIN SR) 100 MG 12 hr tablet Take 100 mg by mouth 2 (two) times daily.    . cephALEXin (KEFLEX) 500 MG capsule Take 1 capsule (500 mg total) by mouth 2 (two) times daily.  Marland Kitchen gabapentin (NEURONTIN) 300 MG capsule Start with 1 tab po qhs X 1 week, then increase to 1 tab po bid X 1 week then 1 tab po prn  . Multiple Vitamin (MULTIVITAMIN) tablet Take 1 tablet by mouth daily.    . raloxifene (EVISTA) 60 MG  tablet Take 60 mg by mouth daily.    Marland Kitchen SPIRIVA HANDIHALER 18 MCG inhalation capsule PLACE 1 CAPSULE INTO INHALER AND INHALE ONCE DAILY  . albuterol (PROVENTIL HFA) 108 (90 Base) MCG/ACT inhaler Inhale 1 puff into the lungs every 6 (six) hours as needed for wheezing or shortness of breath.  . [DISCONTINUED] albuterol (PROVENTIL HFA) 108 (90 BASE) MCG/ACT inhaler Inhale 1 puff into the lungs every 6 (six) hours as needed for wheezing or shortness of breath.    Facility-Administered Encounter Medications as of 12/11/2016  Medication  . 0.9 %  sodium chloride infusion     Review of Systems  Constitutional:   No  weight loss, night sweats,  Fevers, chills,  +fatigue, or  lassitude.  HEENT:   No headaches,  Difficulty  swallowing,  Tooth/dental problems, or  Sore throat,                No sneezing, itching, ear ache, nasal congestion, post nasal drip,   CV:  No chest pain,  Orthopnea, PND, swelling in lower extremities, anasarca, dizziness, palpitations, syncope.   GI  No heartburn, indigestion, abdominal pain, nausea, vomiting, diarrhea, change in bowel habits, loss of appetite, bloody stools.   Resp: .  No chest wall deformity  Skin: no rash or lesions.  GU: no dysuria, change in color of urine, no urgency or frequency.  No flank pain, no hematuria   MS:  No joint pain or swelling.  No decreased range of motion.  No back pain.    Physical Exam  BP 100/70 (BP Location: Left Arm, Patient Position: Sitting, Cuff Size: Normal)   Pulse 89   Ht 5' 4.5" (1.638 m)   Wt 137 lb 9.6 oz (62.4 kg)   SpO2 91%   BMI 23.25 kg/m   GEN: A/Ox3; pleasant , NAD    HEENT:  [place]/AT,  EACs-clear, TMs-wnl, NOSE-clear, THROAT-clear, no lesions, no postnasal drip or exudate noted.   NECK:  Supple w/ fair ROM; no JVD; normal carotid impulses w/o bruits; no thyromegaly or nodules palpated; no lymphadenopathy.    RESP  Decreased BS in bases  w/o, wheezes/ rales/ or rhonchi. no accessory muscle use, no dullness to percussion  CARD:  RRR, no m/r/g, no peripheral edema, pulses intact, no cyanosis or clubbing.  GI:   Soft & nt; nml bowel sounds; no organomegaly or masses detected.   Musco: Warm bil, no deformities or joint swelling noted.   Neuro: alert, no focal deficits noted.    Skin: Warm, no lesions or rashes  Psych:  No change in mood or affect. No depression or anxiety.  No memory loss.  Lab Results:  CBC    Component Value Date/Time   WBC 7.3 10/28/2016 1010   RBC 4.92 10/28/2016 1010   HGB 14.5 10/28/2016 1010   HCT 43.7 10/28/2016 1010   PLT 273 10/28/2016 1010   MCV 88.8 10/28/2016 1010   MCH 29.5 10/28/2016 1010   MCHC 33.2 10/28/2016 1010   RDW 15.0 10/28/2016 1010   LYMPHSABS 2,920  10/28/2016 1010   MONOABS 730 10/28/2016 1010   EOSABS 146 10/28/2016 1010   BASOSABS 0 10/28/2016 1010    BMET    Component Value Date/Time   NA 137 10/28/2016 1101   K 4.5 10/28/2016 1101   CL 100 10/28/2016 1101   CO2 31 10/28/2016 1101   GLUCOSE 82 10/28/2016 1101   BUN 12 10/28/2016 1101   CREATININE 0.75 10/28/2016 1101   CALCIUM 9.6  10/28/2016 1101   GFRNONAA >60 06/01/2016 1120   GFRAA >60 06/01/2016 1120    BNP No results found for: BNP  ProBNP No results found for: PROBNP  Imaging: No results found.   Assessment & Plan:   COPD with emphysema ([place]) Stable on Spiriva  Flu shot is utd Will check with PCP for PVX date   Plan  Patient Instructions  Continue on Spiriva daily  Work on not smoking .  Follow up Dr. Lake Bells  6 months and As needed       Tobacco abuse Smoking cessation   Abnormal CT scan Pt with abn CT Chest -pt has chronic change on chest CT -lung nodules /emphysema appears to unchanged from 2013 . RML collapse /scarring ? Scarring vs progression . She is high risk with smoking  And MRI/CT L Spine sclerotic lesions ? Etiology  PET scan is pending , once done will determine next step .      Rexene Edison, NP 12/11/2016

## 2016-12-11 NOTE — Assessment & Plan Note (Signed)
Pt with abn CT Chest -pt has chronic change on chest CT -lung nodules /emphysema appears to unchanged from 2013 . RML collapse /scarring ? Scarring vs progression . She is high risk with smoking  And MRI/CT L Spine sclerotic lesions ? Etiology  PET scan is pending , once done will determine next step .

## 2016-12-12 NOTE — Progress Notes (Signed)
Reviewed, agree 

## 2016-12-18 ENCOUNTER — Ambulatory Visit (HOSPITAL_COMMUNITY): Payer: Medicare Other

## 2016-12-19 ENCOUNTER — Encounter (HOSPITAL_COMMUNITY): Payer: Medicare Other

## 2017-01-01 ENCOUNTER — Ambulatory Visit (HOSPITAL_COMMUNITY): Payer: Medicare Other

## 2017-01-03 ENCOUNTER — Encounter: Payer: Self-pay | Admitting: Sports Medicine

## 2017-01-13 ENCOUNTER — Encounter (HOSPITAL_COMMUNITY)
Admission: RE | Admit: 2017-01-13 | Discharge: 2017-01-13 | Disposition: A | Discharge status: Home / Self Care | Payer: Medicare Other | Source: Ambulatory Visit | Attending: Sports Medicine | Admitting: Sports Medicine

## 2017-01-13 DIAGNOSIS — Z720 Tobacco use: Secondary | ICD-10-CM | POA: Diagnosis not present

## 2017-01-13 DIAGNOSIS — R938 Abnormal findings on diagnostic imaging of other specified body structures: Secondary | ICD-10-CM | POA: Diagnosis present

## 2017-01-13 DIAGNOSIS — R937 Abnormal findings on diagnostic imaging of other parts of musculoskeletal system: Secondary | ICD-10-CM | POA: Insufficient documentation

## 2017-01-13 DIAGNOSIS — R9389 Abnormal findings on diagnostic imaging of other specified body structures: Secondary | ICD-10-CM

## 2017-01-13 DIAGNOSIS — M899 Disorder of bone, unspecified: Secondary | ICD-10-CM | POA: Insufficient documentation

## 2017-01-13 DIAGNOSIS — J439 Emphysema, unspecified: Secondary | ICD-10-CM | POA: Diagnosis not present

## 2017-01-13 LAB — GLUCOSE, CAPILLARY: Glucose-Capillary: 103 mg/dL — ABNORMAL HIGH (ref 65–99)

## 2017-01-13 MED ORDER — FLUDEOXYGLUCOSE F - 18 (FDG) INJECTION
8.7600 | Freq: Once | INTRAVENOUS | Status: AC | PRN
  Administered 2017-01-13: 8.76 via INTRAVENOUS

## 2017-04-25 IMAGING — CT CT CHEST W/O CM
2 of 4 series · 10 of 36 positions shown, 15 images · IV contrast (READICAT/WATER)
Comparison: CT chest 05/27/2016, 01/23/2012, CT 04/28/2007

CLINICAL DATA: 55-year-old female with prior abnormal MRI and
shortness of breath. Weight loss of 15 pounds.

EXAM:
CT CHEST, ABDOMEN AND PELVIS WITHOUT CONTRAST
TECHNIQUE: Multidetector CT imaging of the chest, abdomen and pelvis was
performed following the standard protocol without IV contrast.

[Series 601: coronal body · coronal · 1.17mm/px · 1 of 116 slices shown, 2 images]
[im 39/116  soft-tissue]
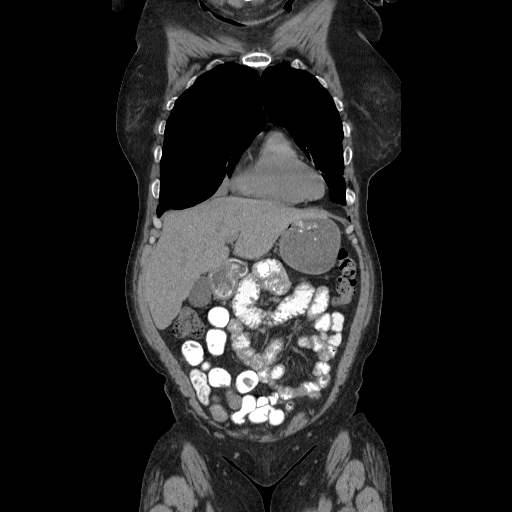
[im 39/116  bone]
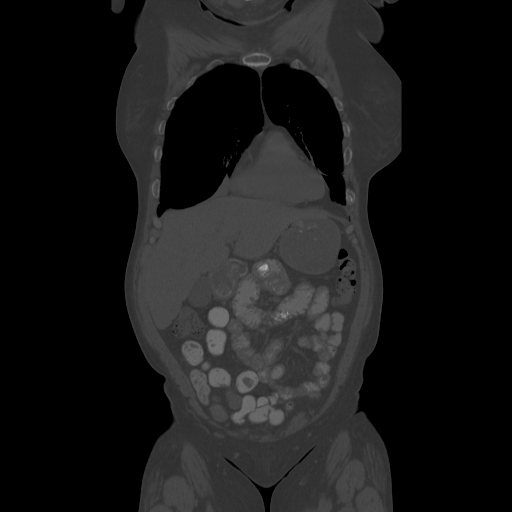

[Series 602: sagittal body · sagittal · 1.17mm/px · 9 of 144 slices shown, 13 images]
[im 14/144  soft-tissue]
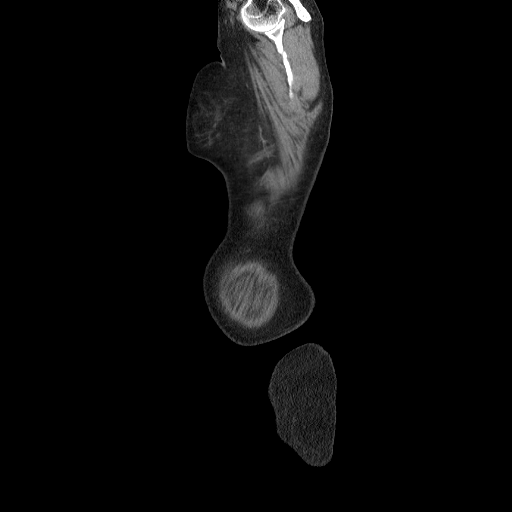
[im 14/144  lung]
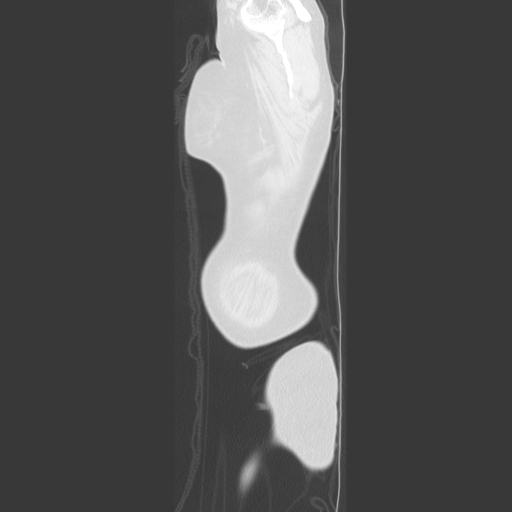
[im 14/144  bone]
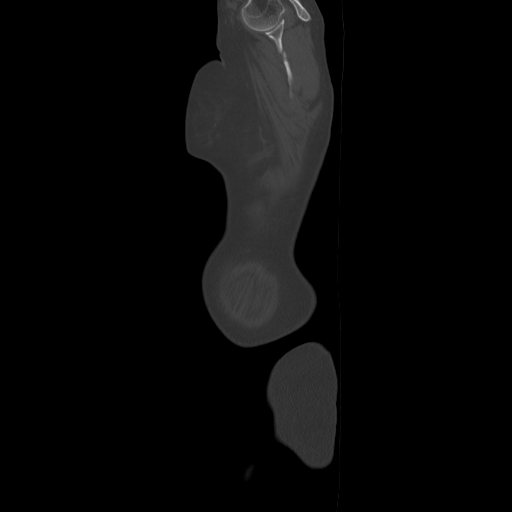
[im 27/144  soft-tissue]
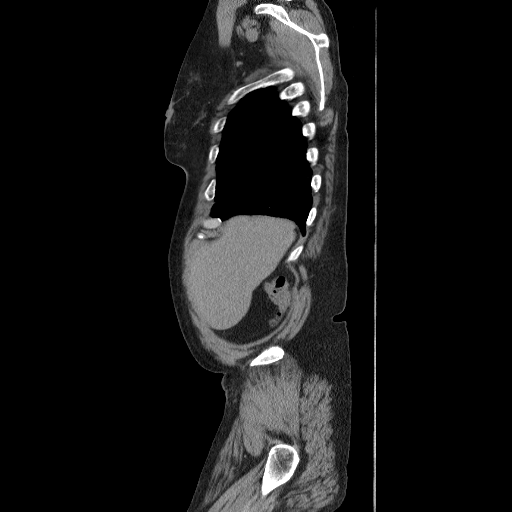
[im 27/144  lung]
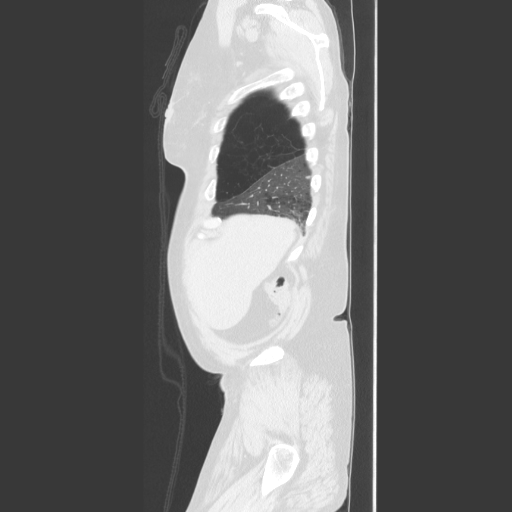
[im 40/144  lung]
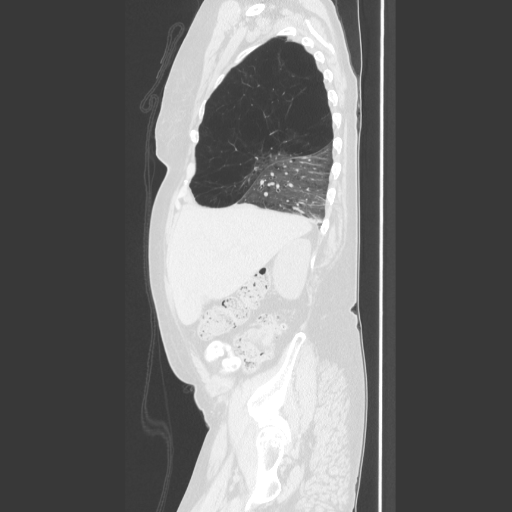
[im 53/144  soft-tissue]
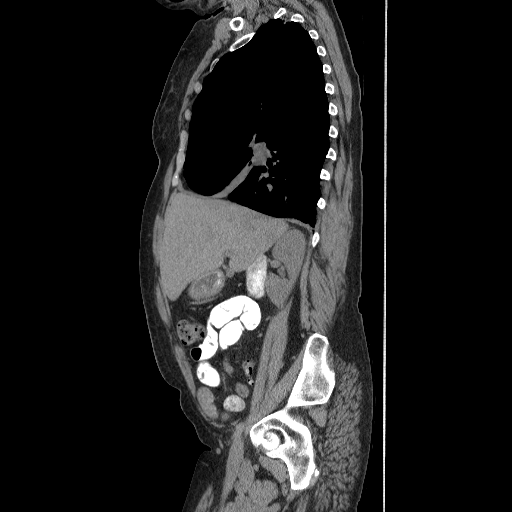
[im 53/144  lung]
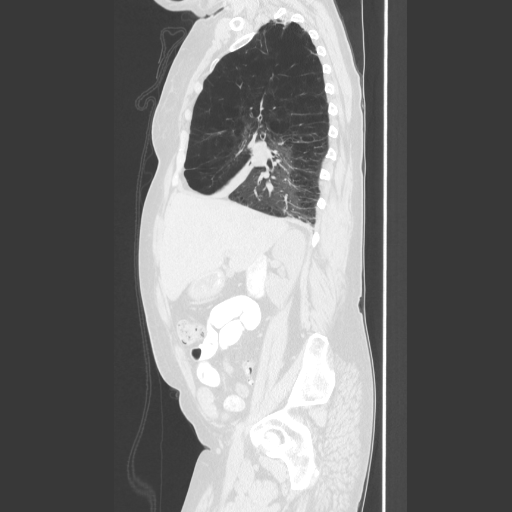
[im 66/144  soft-tissue]
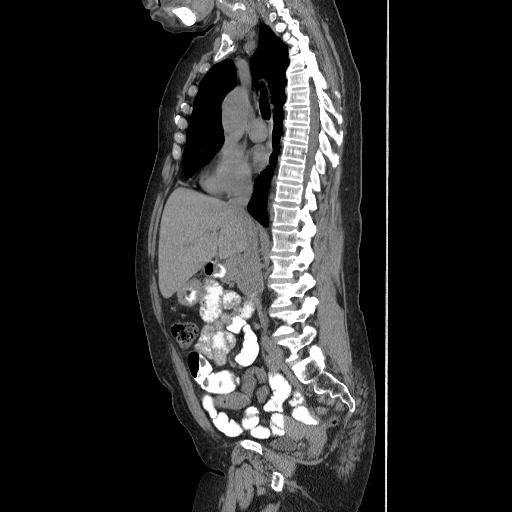
[im 79/144  soft-tissue]
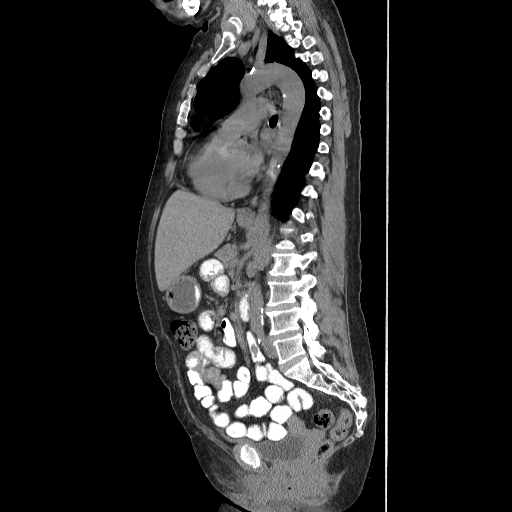
[im 92/144  soft-tissue]
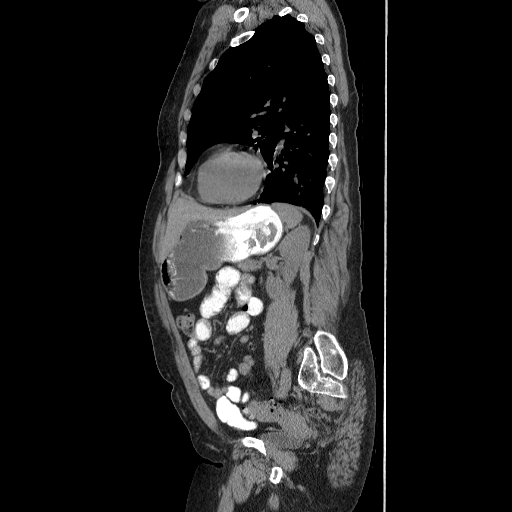
[im 118/144  soft-tissue]
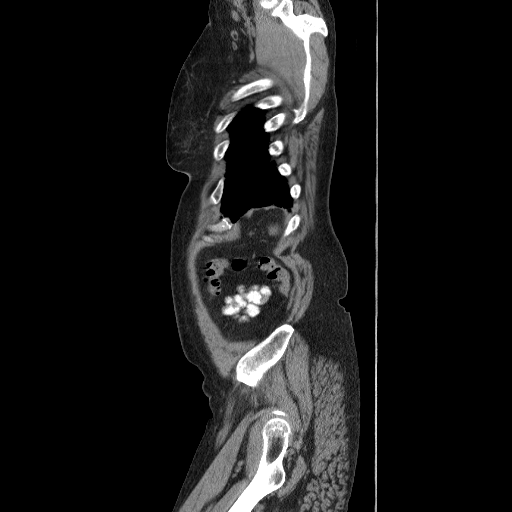
[im 131/144  soft-tissue]
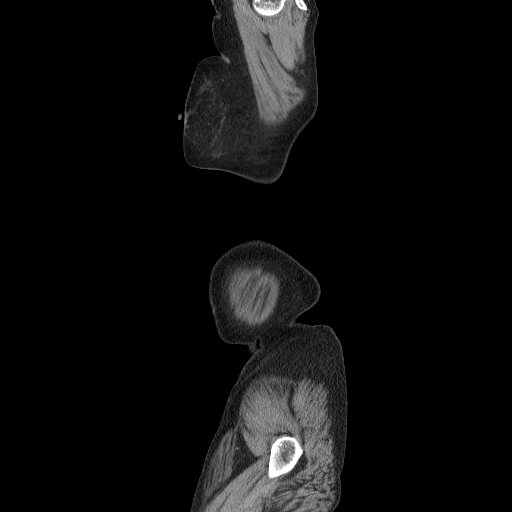

[10 of 36 positions shown; findings below may reference images not displayed]

FINDINGS: CT CHEST

Chest Wall: Superficial soft tissues unremarkable. Small axillary
lymph nodes, not enlarged by CT size criteria more numerous on the
right.

Thoracic Inlet: Unremarkable appearance of the thoracic inlet.
Unremarkable thyroid.

Mediastinum: No significant mediastinal adenopathy. No hiatal
hernia.

Heart: Heart not enlarged. No pericardial fluid/ thickening. No
significant coronary calcifications.

Vasculature: Vasculature not well evaluated given the absence of
intravenous contrast. No periaortic fluid. No aneurysm. Minimal
calcifications of the aortic arch and descending thoracic aorta.

Lungs: Again demonstrated is severe emphysema with bullous changes
of the right greater than left apices, paraseptal and centrilobular
distribution. As was present on the comparison CT there is complete
collapse of the right middle lobe. Nodule present and what appears
to be the anterior segment of the right upper lobe present on the
comparison and measuring approximately 9 mm.

Re- demonstration of right lower lobe architectural
distortion/atelectasis. Re- demonstration of pleural nodularity at
the periphery of the right lower lobe

No defined nodule of the left upper lobe. Small nodule of the left
lower lobe at the posterior periphery measures 4 mm, unchanged from
the CT of 05/27/2016. Pleural nodularity present of the left lower
lobe, unchanged.

Musculoskeletal: Sclerotic focus of the posterior elements of the T5
vertebral body, more pronounced than the comparison.

CT ABDOMEN AND PELVIS

Hepatobiliary: No focal liver abnormality is seen. No gallstones,
gallbladder wall thickening, or biliary dilatation.Unremarkable
gallbladder.

Pancreas: No peripancreatic fluid or inflammatory changes.

Spleen: Normal in size without focal abnormality.

Adrenals/Urinary Tract:

Right adrenal gland unremarkable.  Left adrenal gland unremarkable.

Right kidney:

No right-sided hydronephrosis. No nephrolithiasis.

Left Kidney: No left-sided hydronephrosis or nephrolithiasis.

There is no evidence of left or right perinephric stranding.

Course of the bilateral ureters unremarkable.

Unremarkable appearance of the urinary bladder.

Stomach/Bowel: Unremarkable appearance of stomach with retained
enteric contrast. No abnormally distended small bowel or colon.
Multiple colonic diverticula without evidence of acute
diverticulitis.

Vascular/Lymphatic: Scattered calcifications of the abdominal aorta.
No aneurysm. No periaortic fluid.

Reproductive: Unremarkable appearance of the uterus. There is soft
tissue nodule measuring 3.3 cm in the left pelvis interposed between
the colon and the left adnexa.

Other: No abdominal wall hernia.

Musculoskeletal: Dense sclerotic focus of the L5 vertebral body,
present on the prior MRI, was present also on CT dated 04/28/2007.
Sclerotic focus within the right iliac bone was also present in
4888. There is, however, a new subtle sclerotic focus of the left
iliac bone which was not present on the comparison.

Degenerative changes of the spine. No displaced fracture. Remote
fracture of right-sided lower rib angle now healed.
IMPRESSION: No acute finding of the chest, abdomen, pelvis.

There is a pattern of both new and persisting sclerotic bone
lesions, with 2 new lesions identified in the posterior elements of
T5 and in the left iliac bone. Given that there are sclerotic
lesions which have been present in L5, right pelvis, and vertebral
elements since the CT of 4888, the new lesions may reflect
progression of known prior malignancy, or alternatively new
metastatic disease. Correlation with patient presentation/ history
may be useful, as well as consideration of nuclear medicine bone
scan.

Advanced emphysema again demonstrated with complete collapse of the
right middle lobe and scarring/ nodularity of the bilateral lungs
and pleura, as above. If an occult lung cancer is considered as a
possible source of the new sclerotic lesions of the pelvis and
spine, correlation with PET-CT may be useful or alternatively
contrast-enhanced chest CT.

Aortic atherosclerosis

## 2017-05-21 ENCOUNTER — Ambulatory Visit (INDEPENDENT_AMBULATORY_CARE_PROVIDER_SITE_OTHER): Payer: Medicare Other | Admitting: Pulmonary Disease

## 2017-05-21 ENCOUNTER — Encounter: Payer: Self-pay | Admitting: Pulmonary Disease

## 2017-05-21 DIAGNOSIS — J439 Emphysema, unspecified: Secondary | ICD-10-CM | POA: Diagnosis not present

## 2017-05-21 DIAGNOSIS — Z720 Tobacco use: Secondary | ICD-10-CM

## 2017-05-21 DIAGNOSIS — E440 Moderate protein-calorie malnutrition: Secondary | ICD-10-CM

## 2017-05-21 DIAGNOSIS — J9611 Chronic respiratory failure with hypoxia: Secondary | ICD-10-CM | POA: Diagnosis not present

## 2017-05-21 DIAGNOSIS — E46 Unspecified protein-calorie malnutrition: Secondary | ICD-10-CM | POA: Insufficient documentation

## 2017-05-21 MED ORDER — SALMETEROL XINAFOATE 50 MCG/DOSE IN AEPB: 1.0000 | INHALATION_SPRAY | Freq: Two times a day (BID) | RESPIRATORY_TRACT | 5 refills | Status: DC

## 2017-05-21 NOTE — Addendum Note (Signed)
Addended by: Len Blalock on: 05/21/2017 10:59 AM   Modules accepted: Orders

## 2017-05-21 NOTE — Assessment & Plan Note (Signed)
As above.

## 2017-05-21 NOTE — Patient Instructions (Addendum)
Keep taking Spiriva daily as you are doing Keep using albuterol as needed for chest tightness and shortness of breath Deep using her oxygen as you're doing Take serevent twice a day Quit smoking We will see you back in 3 months or sooner if needed

## 2017-05-21 NOTE — Assessment & Plan Note (Signed)
Keep using 2 L with exertion

## 2017-05-21 NOTE — Assessment & Plan Note (Signed)
Counsel to quit smoking

## 2017-05-21 NOTE — Progress Notes (Signed)
Subjective:    Patient ID: [name] [name], female    DOB: 1961-08-01, 56 y.o.   MRN: [id number]  Synopsis: First referred in 2017 for evaluation of COPD. 2016 pulmonary function testing from [facility] showed FEV1 of 870 mL (37% predicted) consistent with severe airflow obstruction 2015 CT scan from [facility] showed severe panlobular emphysema She is prescribed 2 L O2 qHS and she sometimes uses a portable tank. She continues to smoke cigarettes now.  She quit smoking for one year in 2013 with Chantix.  She tried taking it again when she started back but it didn't help. She smokes 1/2 ppd as of 2017 but she thinks she smoked more.  She started smoking at age 38.    HPI Chief Complaint  Patient presents with  . Follow-up    pt states she is stable, no breathing complaints at this time.    She feels that her emphysema may be getting worse because she has lost weight.   > she has lost about 9 pounds over the last 4-5 months > her PCP is aware.  She continues to have dyspnea and her chronic respiratory failure. > she still uses and benefits form her oxygen > she uses her oxygen more when she walks in the heat 1-2 Lpm > she doesn't use the oxygen > hot weather makes her more dyspneic > she takes spiriva daily > she uses albuterol several times per month, but not not more frequently than than about 2 x per month > however in the summer she uses more than  > she says that the respimat form of stiolto didn't help > Spiriva handihaler makes her feel better.   No cough, no wheeze, no chest pain.     Past Medical History:  Diagnosis Date  . Anxiety   . COPD (chronic obstructive pulmonary disease) ([place])   . Depression   . Osteoporosis   . Seasonal allergies   . Sickle cell trait ([place])       Review of Systems  Constitutional: Negative for chills, fatigue and fever.  HENT: Negative for rhinorrhea, sinus pressure and sneezing.   Respiratory: Positive for shortness of breath. Negative for  cough, wheezing and stridor.   Cardiovascular: Negative for chest pain, palpitations and leg swelling.       Objective:   Physical Exam Vitals:   05/21/17 0952  BP: 118/74  Pulse: 80  SpO2: 94%  Weight: 128 lb 12.8 oz (58.4 kg)  Height: 5' 4.5" (1.638 m)   RA  Gen: well appearing HENT: OP clear, TM's clear, neck supple PULM: poor air movement B, normal percussion CV: RRR, no mgr, trace edema GI: BS+, soft, nontender Derm: no cyanosis or rash Psyche: normal mood and affect    December 2017 records from her visit here reviewed were a PET scan was arranged to follow-up an abnormal CT finding from her chest.  January 2018 PET CT showed scattered sclerotic lesions involving the thoracic or lumbar spine and bilateral pelvis non-FDG avid felt to be benign. Images independently reviewed also showing significant centrilobular emphysema.     Assessment & Plan:  Tobacco abuse Counsel to quit smoking  COPD with emphysema (HCC) Worsening dyspnea, likely due to emphysema. Also with weight loss this year which is likely also due to her severe emphysema.  Plan: Add Serevent twice a day Continue Spiriva Refer to [facility] comprehensive COPD management program with nutritionist to help with weight loss Keep using albuterol as needed  Protein  calorie malnutrition ([place]) As above  Chronic respiratory failure with hypoxia (HCC) Keep using 2 L with exertion    Current Outpatient Prescriptions:  .  albuterol (PROVENTIL HFA) 108 (90 Base) MCG/ACT inhaler, Inhale 1 puff into the lungs every 6 (six) hours as needed for wheezing or shortness of breath., Disp: 1 Inhaler, Rfl: 5 .  albuterol (PROVENTIL) (2.5 MG/3ML) 0.083% nebulizer solution, Take 3 mLs (2.5 mg total) by nebulization every 6 (six) hours as needed for wheezing or shortness of breath., Disp: 360 mL, Rfl: 12 .  buPROPion (WELLBUTRIN SR) 100 MG 12 hr tablet, Take 100 mg by mouth 2 (two) times daily.  , Disp: ,  Rfl:  .  Multiple Vitamin (MULTIVITAMIN) tablet, Take 1 tablet by mouth daily.  , Disp: , Rfl:  .  raloxifene (EVISTA) 60 MG tablet, Take 60 mg by mouth daily.  , Disp: , Rfl:  .  SPIRIVA HANDIHALER 18 MCG inhalation capsule, PLACE 1 CAPSULE INTO INHALER AND INHALE ONCE DAILY, Disp: 30 capsule, Rfl: 5  Current Facility-Administered Medications:  .  0.9 %  sodium chloride infusion, 500 mL, Intravenous, Continuous, Irene Shipper, MD

## 2017-05-21 NOTE — Assessment & Plan Note (Signed)
Worsening dyspnea, likely due to emphysema. Also with weight loss this year which is likely also due to her severe emphysema.  Plan: Add Serevent twice a day Continue Spiriva Refer to [facility] comprehensive COPD management program with nutritionist to help with weight loss Keep using albuterol as needed

## 2017-06-06 ENCOUNTER — Other Ambulatory Visit: Payer: Self-pay | Admitting: Pulmonary Disease

## 2017-06-10 ENCOUNTER — Telehealth: Payer: Self-pay | Admitting: Acute Care

## 2017-06-10 NOTE — Telephone Encounter (Signed)
Hi there, This patient's last CT was in 05/2016. We need to get her repeat scheduled. The order is in the computer, and has been since 10/2016.She was a Lung RADS 2. Thanks so much.

## 2017-06-11 NOTE — Telephone Encounter (Signed)
[name] [name], Dr Teresa Coombs (Orthopedic) did a CT chest , abd and pelvis on ot in 10/2016 followed by a PET scan 12/2016.  Will you review this and let me know if pt still needs repeat scan 05/2017 or if she should wait until 10/2017.  Please advise.

## 2017-06-16 ENCOUNTER — Encounter: Payer: Self-pay | Admitting: Acute Care

## 2017-07-15 ENCOUNTER — Other Ambulatory Visit: Payer: Self-pay | Admitting: Internal Medicine

## 2017-07-15 DIAGNOSIS — Z1231 Encounter for screening mammogram for malignant neoplasm of breast: Secondary | ICD-10-CM

## 2017-08-19 ENCOUNTER — Ambulatory Visit: Payer: Medicare Other

## 2017-08-19 NOTE — Telephone Encounter (Signed)
[name] [name], Did we schedule patient for an 10/2017 scan? Thanks

## 2017-08-22 NOTE — Telephone Encounter (Signed)
Thanks Denise! 

## 2017-08-22 NOTE — Telephone Encounter (Signed)
Yes, Rodena Piety has her on her list to call this month to get CT scheduled.

## 2017-09-02 ENCOUNTER — Encounter (HOSPITAL_COMMUNITY): Payer: Self-pay | Admitting: Emergency Medicine

## 2017-09-02 ENCOUNTER — Emergency Department (HOSPITAL_COMMUNITY)
Admission: EM | Admit: 2017-09-02 | Discharge: 2017-09-02 | Disposition: A | Discharge status: Home / Self Care | Payer: Medicare Other | Attending: Emergency Medicine | Admitting: Emergency Medicine

## 2017-09-02 ENCOUNTER — Emergency Department (HOSPITAL_COMMUNITY): Payer: Medicare Other

## 2017-09-02 ENCOUNTER — Other Ambulatory Visit: Payer: Self-pay

## 2017-09-02 DIAGNOSIS — R0602 Shortness of breath: Secondary | ICD-10-CM | POA: Diagnosis present

## 2017-09-02 DIAGNOSIS — T59811A Toxic effect of smoke, accidental (unintentional), initial encounter: Secondary | ICD-10-CM

## 2017-09-02 DIAGNOSIS — J705 Respiratory conditions due to smoke inhalation: Secondary | ICD-10-CM | POA: Diagnosis not present

## 2017-09-02 DIAGNOSIS — J441 Chronic obstructive pulmonary disease with (acute) exacerbation: Secondary | ICD-10-CM | POA: Insufficient documentation

## 2017-09-02 LAB — COOXEMETRY PANEL
Carboxyhemoglobin: 6.2 % (ref 0.5–1.5)
Methemoglobin: 1 % (ref 0.0–1.5)
O2 SAT: 99.4 %
Total hemoglobin: 14.1 g/dL (ref 12.0–16.0)

## 2017-09-02 LAB — CBC WITH DIFFERENTIAL/PLATELET
BASOS ABS: 0 10*3/uL (ref 0.0–0.1)
BASOS PCT: 0 %
EOS ABS: 0.2 10*3/uL (ref 0.0–0.7)
EOS PCT: 1 %
HCT: 40.4 % (ref 36.0–46.0)
Hemoglobin: 13.7 g/dL (ref 12.0–15.0)
Lymphocytes Relative: 42 %
Lymphs Abs: 5.2 10*3/uL — ABNORMAL HIGH (ref 0.7–4.0)
MCH: 28.8 pg (ref 26.0–34.0)
MCHC: 33.9 g/dL (ref 30.0–36.0)
MCV: 84.9 fL (ref 78.0–100.0)
MONO ABS: 0.5 10*3/uL (ref 0.1–1.0)
MONOS PCT: 4 %
Neutro Abs: 6.6 10*3/uL (ref 1.7–7.7)
Neutrophils Relative %: 53 %
PLATELETS: 222 10*3/uL (ref 150–400)
RBC: 4.76 MIL/uL (ref 3.87–5.11)
RDW: 13.8 % (ref 11.5–15.5)
WBC: 12.4 10*3/uL — ABNORMAL HIGH (ref 4.0–10.5)

## 2017-09-02 LAB — COMPREHENSIVE METABOLIC PANEL
ALBUMIN: 3.9 g/dL (ref 3.5–5.0)
ALT: 30 U/L (ref 14–54)
ANION GAP: 5 (ref 5–15)
AST: 29 U/L (ref 15–41)
Alkaline Phosphatase: 77 U/L (ref 38–126)
BILIRUBIN TOTAL: 0.4 mg/dL (ref 0.3–1.2)
BUN: 17 mg/dL (ref 6–20)
CALCIUM: 8.7 mg/dL — AB (ref 8.9–10.3)
CHLORIDE: 106 mmol/L (ref 101–111)
CO2: 27 mmol/L (ref 22–32)
Creatinine, Ser: 1.04 mg/dL — ABNORMAL HIGH (ref 0.44–1.00)
GFR calc Af Amer: >60 mL/min (ref >60)
GFR calc non Af Amer: 58 mL/min — ABNORMAL LOW (ref >60)
GLUCOSE: 168 mg/dL — AB (ref 65–99)
POTASSIUM: 4 mmol/L (ref 3.5–5.1)
SODIUM: 138 mmol/L (ref 135–145)
Total Protein: 7.5 g/dL (ref 6.5–8.1)

## 2017-09-02 LAB — BLOOD GAS, ARTERIAL
ACID-BASE DEFICIT: 1.6 mmol/L (ref 0.0–2.0)
BICARBONATE: 24.8 mmol/L (ref 20.0–28.0)
Drawn by: 331761
FIO2: 100
O2 SAT: 99.4 %
PCO2 ART: 59.1 mmHg — AB (ref 32.0–48.0)
PH ART: 7.246 — AB (ref 7.350–7.450)
PO2 ART: 366 mmHg — AB (ref 83.0–108.0)
Patient temperature: 98.6

## 2017-09-02 LAB — BRAIN NATRIURETIC PEPTIDE: B NATRIURETIC PEPTIDE 5: 14 pg/mL (ref 0.0–100.0)

## 2017-09-02 LAB — I-STAT CG4 LACTIC ACID, ED: LACTIC ACID, VENOUS: 0.72 mmol/L (ref 0.5–1.9)

## 2017-09-02 LAB — PROTIME-INR
INR: 0.85
PROTHROMBIN TIME: 11.5 s (ref 11.4–15.2)

## 2017-09-02 LAB — I-STAT TROPONIN, ED: Troponin i, poc: 0.01 ng/mL (ref 0.00–0.08)

## 2017-09-02 MED ORDER — METHYLPREDNISOLONE SODIUM SUCC 125 MG IJ SOLR
125.0000 mg | Freq: Once | INTRAMUSCULAR | Status: AC
  Administered 2017-09-02: 125 mg via INTRAVENOUS
  Filled 2017-09-02: qty 2

## 2017-09-02 MED ORDER — IPRATROPIUM-ALBUTEROL 0.5-2.5 (3) MG/3ML IN SOLN
3.0000 mL | Freq: Once | RESPIRATORY_TRACT | Status: AC
  Administered 2017-09-02: 3 mL via RESPIRATORY_TRACT
  Filled 2017-09-02: qty 3

## 2017-09-02 MED ORDER — MAGNESIUM SULFATE 2 GM/50ML IV SOLN
2.0000 g | Freq: Once | INTRAVENOUS | Status: AC
  Administered 2017-09-02: 2 g via INTRAVENOUS
  Filled 2017-09-02: qty 50
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
## 2017-09-02 NOTE — ED Notes (Signed)
Pt used bedside commode.

## 2017-09-02 NOTE — Discharge Instructions (Signed)
Please follow-up with your primary care physician in several days for reassessment. If any symptoms change or worsen, please return immediately to the nearest emergency department.
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
## 2017-09-02 NOTE — ED Notes (Signed)
X-ray at bedside

## 2017-09-02 NOTE — ED Notes (Signed)
Turned off O2 to check sats on RA. Remained at 95%. Dr. Sherry Ruffing aware.

## 2017-09-02 NOTE — Progress Notes (Signed)
Pt. Arrived by EMS on CPAP. Pt. Was placed on BIPAP 14/7, 10, 100%. Pt. Immediately began trying to take the BIPAP mask off screaming "get this off of me" Pt. Was taken off BIPAP & placed on 100% NRB. MD & RN are aware.

## 2017-09-02 NOTE — ED Notes (Signed)
Pt became sob again while standing up using commode, sats dropped to 90%, pt placed back in bed and resting comfortably at this time. sats 96%

## 2017-09-03 ENCOUNTER — Encounter: Payer: Self-pay | Admitting: Pulmonary Disease

## 2017-09-05 ENCOUNTER — Encounter: Payer: Self-pay | Admitting: Pulmonary Disease

## 2017-09-05 ENCOUNTER — Ambulatory Visit (INDEPENDENT_AMBULATORY_CARE_PROVIDER_SITE_OTHER): Payer: Medicare Other | Admitting: Pulmonary Disease

## 2017-09-05 ENCOUNTER — Ambulatory Visit: Payer: Medicare Other | Admitting: Pulmonary Disease

## 2017-09-05 VITALS — BP 122/68 | HR 82 | Ht 64.5 in | Wt 133.0 lb

## 2017-09-05 DIAGNOSIS — F17210 Nicotine dependence, cigarettes, uncomplicated: Secondary | ICD-10-CM

## 2017-09-05 DIAGNOSIS — Z23 Encounter for immunization: Secondary | ICD-10-CM | POA: Diagnosis not present

## 2017-09-05 DIAGNOSIS — J439 Emphysema, unspecified: Secondary | ICD-10-CM

## 2017-09-05 DIAGNOSIS — Z720 Tobacco use: Secondary | ICD-10-CM

## 2017-09-05 MED ORDER — VARENICLINE TARTRATE 0.5 MG X 11 & 1 MG X 42 PO MISC: ORAL | 0 refills | Status: DC

## 2017-09-05 NOTE — Patient Instructions (Signed)
For cigarette smoking: Use Chantix as directed by the starter pack If you have abnormal thoughts or mood stop taking the medicine right away and call me Reviewed the smoking cessation information sheet we gave you   For COPD: Continue taking Serevent and Spiriva Flu shot today Stay active  We will see you back in 4-6 months or sooner if needed
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
## 2017-09-09 ENCOUNTER — Ambulatory Visit: Payer: Medicare Other

## 2017-09-09 ENCOUNTER — Telehealth: Payer: Self-pay | Admitting: Adult Health

## 2017-09-09 NOTE — Telephone Encounter (Signed)
BQ patient seen by TP on 12.27.17 for 3 month emphysema follow up  Received form from [facility] w/ HIM for a Medical Record Clarification Request: "your office note of 12/11/2017 documents depression, depressive order or depressed.  The patient noted to be on buproprion.  Please clarify, if you can, the specific type of depression, such as...reactive depressop, situational depression, recurrent depression, acute depression, singel episode of major depressive disorder, recurrent episode of major depressive discorder, other type of depression, unknown type of depression."  Patient did not mention any depression during the office visit, nor was this diagnosis addressed or documented on in the Assessment and Plan.  Depression is only mentioned in pt's Past Medical History within the office note. Therefore we are unable to assist in this matter.  LMOM TCB x1 for [facility] w/ HIM @ [phone]

## 2017-09-12 NOTE — Telephone Encounter (Signed)
Called spoke with [facility] w/ HIM and discussed the below Felecia recommended to write this information on the cover sheet back to [facility]  This has been done and sent for scan  Nothing further needed; will sign off

## 2017-09-19 ENCOUNTER — Other Ambulatory Visit: Payer: Self-pay | Admitting: Obstetrics and Gynecology

## 2017-09-19 ENCOUNTER — Other Ambulatory Visit (HOSPITAL_COMMUNITY)
Admission: RE | Admit: 2017-09-19 | Discharge: 2017-09-19 | Disposition: A | Discharge status: Home / Self Care | Payer: Medicare Other | Source: Ambulatory Visit | Attending: Obstetrics and Gynecology | Admitting: Obstetrics and Gynecology

## 2017-09-19 DIAGNOSIS — Z01419 Encounter for gynecological examination (general) (routine) without abnormal findings: Secondary | ICD-10-CM | POA: Insufficient documentation

## 2017-09-22 ENCOUNTER — Ambulatory Visit
Admission: RE | Admit: 2017-09-22 | Discharge: 2017-09-22 | Disposition: A | Discharge status: Home / Self Care | Payer: Medicare Other | Source: Ambulatory Visit | Attending: Internal Medicine | Admitting: Internal Medicine

## 2017-09-22 DIAGNOSIS — Z1231 Encounter for screening mammogram for malignant neoplasm of breast: Secondary | ICD-10-CM

## 2017-09-22 LAB — CYTOLOGY - PAP: DIAGNOSIS: NEGATIVE

## 2017-09-26 ENCOUNTER — Inpatient Hospital Stay (HOSPITAL_COMMUNITY): Admission: RE | Admit: 2017-09-26 | Payer: Medicare Other | Source: Ambulatory Visit

## 2017-09-29 ENCOUNTER — Encounter (HOSPITAL_COMMUNITY): Payer: Self-pay

## 2017-09-29 ENCOUNTER — Encounter (HOSPITAL_COMMUNITY)
Admission: RE | Admit: 2017-09-29 | Discharge: 2017-09-29 | Disposition: A | Discharge status: Home / Self Care | Payer: Medicare Other | Source: Ambulatory Visit | Attending: Obstetrics and Gynecology | Admitting: Obstetrics and Gynecology

## 2017-09-29 DIAGNOSIS — F17210 Nicotine dependence, cigarettes, uncomplicated: Secondary | ICD-10-CM | POA: Diagnosis not present

## 2017-09-29 DIAGNOSIS — D219 Benign neoplasm of connective and other soft tissue, unspecified: Secondary | ICD-10-CM | POA: Diagnosis not present

## 2017-09-29 DIAGNOSIS — N858 Other specified noninflammatory disorders of uterus: Secondary | ICD-10-CM | POA: Diagnosis not present

## 2017-09-29 DIAGNOSIS — Z79899 Other long term (current) drug therapy: Secondary | ICD-10-CM | POA: Diagnosis not present

## 2017-09-29 DIAGNOSIS — J449 Chronic obstructive pulmonary disease, unspecified: Secondary | ICD-10-CM | POA: Diagnosis not present

## 2017-09-29 DIAGNOSIS — N950 Postmenopausal bleeding: Secondary | ICD-10-CM | POA: Diagnosis present

## 2017-09-29 DIAGNOSIS — N840 Polyp of corpus uteri: Secondary | ICD-10-CM | POA: Diagnosis not present

## 2017-09-29 HISTORY — DX: Unspecified osteoarthritis, unspecified site: M19.90

## 2017-09-29 HISTORY — DX: Anemia, unspecified: D64.9

## 2017-09-29 HISTORY — DX: Disease of blood and blood-forming organs, unspecified: D75.9

## 2017-09-29 LAB — CBC
HCT: 41.1 % (ref 36.0–46.0)
Hemoglobin: 14.3 g/dL (ref 12.0–15.0)
MCH: 29.4 pg (ref 26.0–34.0)
MCHC: 34.8 g/dL (ref 30.0–36.0)
MCV: 84.4 fL (ref 78.0–100.0)
PLATELETS: 248 10*3/uL (ref 150–400)
RBC: 4.87 MIL/uL (ref 3.87–5.11)
RDW: 14.3 % (ref 11.5–15.5)
WBC: 8 10*3/uL (ref 4.0–10.5)

## 2017-09-29 NOTE — Pre-Procedure Instructions (Signed)
Dr. Ola Spurr viewed  EKG and okay'd

## 2017-09-29 NOTE — Patient Instructions (Addendum)
Your procedure is scheduled on: Tuesday September 30, 2017 at 7:30 am  Enter through the [employer] [facility] at: 6:00 am  Pick up the phone at the desk and dial [phone].  Call this number if you have problems the morning of surgery: [phone].  Remember: Do NOT eat food or drink any fluids: After Midnight on Monday September 29, 2017 Do NOT drink clear liquids after: Take these medicines the morning of surgery with a SIP OF WATER:  CHANTIX, BUPROPION, RALOXIFENE, SERVENT, SPRIVA  STOP ALL VITAMINS AND SUPPLEMENTS TODAY  BRING INHALER WITH YOU DAY OF SURGERY  Do NOT wear jewelry (body piercing), metal hair clips/bobby pins, make-up, or nail polish. Do NOT wear lotions, powders, or perfumes.  You may wear deoderant. Do NOT shave for 48 hours prior to surgery. Do NOT bring valuables to the hospital. Contacts, dentures, or bridgework may not be worn into surgery.  Have a responsible adult drive you home and stay with you for 24 hours after your procedure

## 2017-09-29 NOTE — Pre-Procedure Instructions (Addendum)
EKG VIEWED BY DR. FITZGERALD. Patient was planning to drive herself home after surgery. Instructed patient she needs someone to drive her home and stay with her 24 hours.

## 2017-09-30 ENCOUNTER — Encounter (HOSPITAL_COMMUNITY): Payer: Self-pay

## 2017-09-30 ENCOUNTER — Encounter (HOSPITAL_COMMUNITY)
Admission: RE | Disposition: A | Discharge status: Home / Self Care | Payer: Self-pay | Source: Ambulatory Visit | Attending: Obstetrics and Gynecology

## 2017-09-30 ENCOUNTER — Ambulatory Visit (HOSPITAL_COMMUNITY): Payer: Medicare Other | Admitting: Anesthesiology

## 2017-09-30 ENCOUNTER — Ambulatory Visit (HOSPITAL_COMMUNITY)
Admission: RE | Admit: 2017-09-30 | Discharge: 2017-09-30 | Disposition: A | Discharge status: Home / Self Care | Payer: Medicare Other | Source: Ambulatory Visit | Attending: Obstetrics and Gynecology | Admitting: Obstetrics and Gynecology

## 2017-09-30 DIAGNOSIS — N950 Postmenopausal bleeding: Secondary | ICD-10-CM | POA: Insufficient documentation

## 2017-09-30 DIAGNOSIS — N858 Other specified noninflammatory disorders of uterus: Secondary | ICD-10-CM | POA: Diagnosis not present

## 2017-09-30 DIAGNOSIS — J449 Chronic obstructive pulmonary disease, unspecified: Secondary | ICD-10-CM | POA: Insufficient documentation

## 2017-09-30 DIAGNOSIS — D219 Benign neoplasm of connective and other soft tissue, unspecified: Secondary | ICD-10-CM | POA: Insufficient documentation

## 2017-09-30 DIAGNOSIS — N840 Polyp of corpus uteri: Secondary | ICD-10-CM | POA: Diagnosis not present

## 2017-09-30 DIAGNOSIS — F17210 Nicotine dependence, cigarettes, uncomplicated: Secondary | ICD-10-CM | POA: Insufficient documentation

## 2017-09-30 DIAGNOSIS — Z79899 Other long term (current) drug therapy: Secondary | ICD-10-CM | POA: Insufficient documentation

## 2017-09-30 HISTORY — PX: DILATATION & CURETTAGE/HYSTEROSCOPY WITH MYOSURE: SHX6511

## 2017-09-30 SURGERY — DILATATION & CURETTAGE/HYSTEROSCOPY WITH MYOSURE
Anesthesia: General | Site: Vagina

## 2017-09-30 MED ORDER — KETOROLAC TROMETHAMINE 30 MG/ML IJ SOLN
INTRAMUSCULAR | Status: AC
  Filled 2017-09-30: qty 1

## 2017-09-30 MED ORDER — SCOPOLAMINE 1 MG/3DAYS TD PT72
MEDICATED_PATCH | TRANSDERMAL | Status: AC
  Administered 2017-09-30: 1.5 mg via TRANSDERMAL
  Filled 2017-09-30: qty 1

## 2017-09-30 MED ORDER — PROPOFOL 10 MG/ML IV BOLUS
INTRAVENOUS | Status: DC | PRN
Start: 2017-09-30 — End: 2017-09-30
  Administered 2017-09-30: 200 mg via INTRAVENOUS

## 2017-09-30 MED ORDER — LIDOCAINE HCL 2 % IJ SOLN
INTRAMUSCULAR | Status: DC | PRN
  Administered 2017-09-30: 10 mL

## 2017-09-30 MED ORDER — PROMETHAZINE HCL 25 MG/ML IJ SOLN: 6.2500 mg | INTRAMUSCULAR | Status: DC | PRN

## 2017-09-30 MED ORDER — LIDOCAINE HCL (CARDIAC) 20 MG/ML IV SOLN
INTRAVENOUS | Status: AC
  Filled 2017-09-30: qty 5

## 2017-09-30 MED ORDER — PHENYLEPHRINE HCL 10 MG/ML IJ SOLN
INTRAMUSCULAR | Status: DC | PRN
  Administered 2017-09-30: 80 ug via INTRAVENOUS
  Administered 2017-09-30 (×2): 40 ug via INTRAVENOUS

## 2017-09-30 MED ORDER — DEXAMETHASONE SODIUM PHOSPHATE 10 MG/ML IJ SOLN
INTRAMUSCULAR | Status: AC
  Filled 2017-09-30: qty 1

## 2017-09-30 MED ORDER — MIDAZOLAM HCL 2 MG/2ML IJ SOLN
INTRAMUSCULAR | Status: AC
  Filled 2017-09-30: qty 2

## 2017-09-30 MED ORDER — LACTATED RINGERS IV SOLN
INTRAVENOUS | Status: DC
  Administered 2017-09-30: 125 mL/h via INTRAVENOUS

## 2017-09-30 MED ORDER — ONDANSETRON HCL 4 MG/2ML IJ SOLN
INTRAMUSCULAR | Status: DC | PRN
  Administered 2017-09-30: 4 mg via INTRAVENOUS

## 2017-09-30 MED ORDER — DEXAMETHASONE SODIUM PHOSPHATE 10 MG/ML IJ SOLN
INTRAMUSCULAR | Status: DC | PRN
  Administered 2017-09-30: 4 mg via INTRAVENOUS

## 2017-09-30 MED ORDER — IBUPROFEN 600 MG PO TABS: 600.0000 mg | ORAL_TABLET | Freq: Four times a day (QID) | ORAL | 0 refills | Status: DC | PRN

## 2017-09-30 MED ORDER — OXYCODONE HCL 5 MG PO TABS: 5.0000 mg | ORAL_TABLET | Freq: Once | ORAL | Status: DC | PRN

## 2017-09-30 MED ORDER — MIDAZOLAM HCL 2 MG/2ML IJ SOLN
INTRAMUSCULAR | Status: DC | PRN
Start: 2017-09-30 — End: 2017-09-30
  Administered 2017-09-30 (×2): 0.5 mg via INTRAVENOUS

## 2017-09-30 MED ORDER — SCOPOLAMINE 1 MG/3DAYS TD PT72
1.0000 | MEDICATED_PATCH | Freq: Once | TRANSDERMAL | Status: DC
  Administered 2017-09-30: 1.5 mg via TRANSDERMAL

## 2017-09-30 MED ORDER — VASOPRESSIN 20 UNIT/ML IV SOLN
INTRAVENOUS | Status: DC | PRN
  Administered 2017-09-30: 6 mL via INTRAMUSCULAR

## 2017-09-30 MED ORDER — PROPOFOL 10 MG/ML IV BOLUS
INTRAVENOUS | Status: AC
  Filled 2017-09-30: qty 20

## 2017-09-30 MED ORDER — LIDOCAINE HCL 1 % IJ SOLN
INTRAMUSCULAR | Status: AC
  Filled 2017-09-30: qty 1

## 2017-09-30 MED ORDER — OXYCODONE HCL 5 MG/5ML PO SOLN
5.0000 mg | Freq: Once | ORAL | Status: DC | PRN
Start: 2017-09-30 — End: 2017-09-30

## 2017-09-30 MED ORDER — SODIUM CHLORIDE 0.9 % IR SOLN
Status: DC | PRN
  Administered 2017-09-30: 3000 mL

## 2017-09-30 MED ORDER — HYDROMORPHONE HCL 1 MG/ML IJ SOLN: 0.2500 mg | INTRAMUSCULAR | Status: DC | PRN

## 2017-09-30 MED ORDER — LIDOCAINE HCL (CARDIAC) 20 MG/ML IV SOLN
INTRAVENOUS | Status: DC | PRN
  Administered 2017-09-30: 80 mg via INTRAVENOUS

## 2017-09-30 MED ORDER — VASOPRESSIN 20 UNIT/ML IV SOLN
INTRAVENOUS | Status: AC
  Filled 2017-09-30: qty 1

## 2017-09-30 MED ORDER — ONDANSETRON HCL 4 MG/2ML IJ SOLN
INTRAMUSCULAR | Status: AC
  Filled 2017-09-30: qty 2

## 2017-09-30 MED ORDER — FENTANYL CITRATE (PF) 100 MCG/2ML IJ SOLN
INTRAMUSCULAR | Status: AC
  Filled 2017-09-30: qty 2

## 2017-09-30 MED ORDER — EPHEDRINE 5 MG/ML INJ
INTRAVENOUS | Status: AC
  Filled 2017-09-30: qty 10

## 2017-09-30 MED ORDER — EPHEDRINE SULFATE 50 MG/ML IJ SOLN
INTRAMUSCULAR | Status: DC | PRN
  Administered 2017-09-30: 5 mg via INTRAVENOUS

## 2017-09-30 MED ORDER — FENTANYL CITRATE (PF) 100 MCG/2ML IJ SOLN
INTRAMUSCULAR | Status: DC | PRN
Start: 2017-09-30 — End: 2017-09-30
  Administered 2017-09-30: 25 ug via INTRAVENOUS
  Administered 2017-09-30: 50 ug via INTRAVENOUS
  Administered 2017-09-30: 25 ug via INTRAVENOUS

## 2017-09-30 MED ORDER — SODIUM CHLORIDE 0.9 % IJ SOLN
INTRAMUSCULAR | Status: AC
  Filled 2017-09-30: qty 50

## 2017-09-30 MED ORDER — LIDOCAINE HCL 2 % IJ SOLN
INTRAMUSCULAR | Status: AC
  Filled 2017-09-30: qty 20

## 2017-09-30 MED ORDER — MEPERIDINE HCL 25 MG/ML IJ SOLN: 6.2500 mg | INTRAMUSCULAR | Status: DC | PRN

## 2017-09-30 SURGICAL SUPPLY — 17 items
CANISTER SUCT 3000ML PPV (MISCELLANEOUS) ×3 IMPLANT
CATH ROBINSON RED A/P 16FR (CATHETERS) ×3 IMPLANT
CONTAINER PREFILL 10% NBF 60ML (FORM) ×6 IMPLANT
DEVICE MYOSURE LITE (MISCELLANEOUS) IMPLANT
DEVICE MYOSURE REACH (MISCELLANEOUS) ×2 IMPLANT
DILATOR CANAL MILEX (MISCELLANEOUS) ×1 IMPLANT
FILTER ARTHROSCOPY CONVERTOR (FILTER) ×3 IMPLANT
GLOVE BIO SURGEON STRL SZ7 (GLOVE) ×3 IMPLANT
GLOVE BIOGEL PI IND STRL 7.0 (GLOVE) ×2 IMPLANT
GLOVE BIOGEL PI INDICATOR 7.0 (GLOVE) ×4
GOWN STRL REUS W/TWL LRG LVL3 (GOWN DISPOSABLE) ×6 IMPLANT
PACK VAGINAL MINOR WOMEN LF (CUSTOM PROCEDURE TRAY) ×3 IMPLANT
PAD OB MATERNITY 4.3X12.25 (PERSONAL CARE ITEMS) ×3 IMPLANT
SEAL ROD LENS SCOPE MYOSURE (ABLATOR) ×3 IMPLANT
TOWEL OR 17X24 6PK STRL BLUE (TOWEL DISPOSABLE) ×6 IMPLANT
TUBING AQUILEX INFLOW (TUBING) ×3 IMPLANT
TUBING AQUILEX OUTFLOW (TUBING) ×3 IMPLANT

## 2017-09-30 NOTE — Interval H&P Note (Signed)
History and Physical Interval Note:  09/30/2017 7:17 AM  [name] [name]  has presented today for surgery, with the diagnosis of N95.0 PMB  The various methods of treatment have been discussed with the patient and family. After consideration of risks, benefits and other options for treatment, the patient has consented to  Procedure(s) with comments: [place] (N/A) - PMB as a surgical intervention .  The patient's history has been reviewed, patient examined, no change in status, stable for surgery.  I have reviewed the patient's chart and labs.  Questions were answered to the patient's satisfaction.     Simona Huh, Amal Renbarger

## 2017-09-30 NOTE — H&P (Signed)
Reason for Appointment  1. PMB & abd pain/current pt of Hurshell Dino/ref by Jilda Panda   History of Present Illness  General:  Pt referred back by PCP. Pt has a h/o PMB 1 year ago. Did not have Hyst/D&C. EMB 06/2016 showed a benign endometrial polyp. Pt has had intermittent spotting throughout the year. Pt also has bleeding after sex.  Pt reports RLQ pain that comes and goes. No associated factors. Usually sudden onset of pain. Twisting sensation. She has regular bowel movements. Color of stool has changed, it's lighter. No blood in her stool. Pt has lost 7 lbs this year.   Current Medications  Taking   Albuterol Sulfate HFA 108 (90 Base) MCG/ACT Aerosol Solution 2 puffs as needed Inhalation every 4 hrs   Oxygen as directed   BuPROPion HCl 100 MG Tablet 1 tablet Orally Once a day   Proventil HFA(Albuterol Sulfate HFA) 108 (90 Base) MCG/ACT Aerosol Solution 2 puffs as needed Inhalation every 4 hrs   Raloxifene HCl 60 MG Tablet 1 tablet Orally Once a day   Anoro Ellipta(Umeclidinium-Vilanterol) 62.5-25 MCG/INH Aerosol Powder Breath Activated 1 puff Inhalation Once a day   Chantix(Varenicline Tartrate) 0.5 MG Tablet 1 tablet Orally Once a day   Not-Taking   Evista(Raloxifene HCl) 60 MG Tablet 1 tablet Orally Once a day   PredniSONE 10 MG 10 MG Tablet 1 tablet Orally Once a day   Doxycycline Hyclate 100 MG Tablet 1 tablet Orally every 12 hrs   Medication List reviewed and reconciled with the patient    Past Medical History  COPD.   Per cancerous polyps on colonoscopy, repeat in 5 years.   Osteoporosis.           Surgical History  C-section    Family History  Father: deceased, pancreatic cancer  Mother: deceased, Heart disease, stroke  Brother 1: throat cancer  denies any GYN family cancer hx.   Social History  General:  Tobacco use  cigarettes: Current smoker Frequency: 1/2 PPD Tobacco history last updated 09/19/2017 EXPOSURE TO PASSIVE SMOKE: less than 1/2 pk/day.   Alcohol: yes, occasionally.  no Recreational drug use.  Exercise: walks.  Children: Boys, 1.    Gyn History  Sexual activity currently sexually active, somtimes.  Periods : postmenopausal since 2000, had some spotting/bleeding 2 months ago and it lasted about 3 days, some bleeding last month.  Denies H/O LMP.  Denies H/O Birth control.  Last pap smear date 06/06/16 all negative.  Last mammogram date per pt 03/2016.  Abnormal pap smear maybe when younger.    OB History  Number of pregnancies 2.  Pregnancy # 1 abortion, 5 months.  Pregnancy # 2 live birth, C-section delivery, boy.    Allergies  N.K.D.A.   Hospitalization/Major Diagnostic Procedure  childbirth x 1    Review of Systems  See HPI.   Vital Signs  Wt 134, Wt change -2 lb, Ht 64, BMI 23.00, Pulse sitting 80, BP sitting 147/90.   Physical Examination  GENERAL:  Patient appears alert and oriented.  General Appearance: well-appearing, well-developed, no acute distress.  Speech: clear.  LUNGS:  Effort: no respiratory distress, comfortable breathing.  ABDOMEN:  General: no masses tenderness or organomegaly, non distended.  FEMALE GENITOURINARY:  Cervix visualized, healthy appearing, no discharge, no lesions.  Adnexa: no mass, right adnexa is tender.Marland Kitchen  Uterus: normal size/shape/consistency, freely mobile, non tender.  Vagina: atrophic mucosa, no lesions, no abnormal discharge.  Vulva: normal, no lesions, no skin discoloration.  Anus:  no external hemosrhoids.  EXTREMITIES:  general no edema.     Assessments   1. PMB (postmenopausal bleeding) - N95.0 (Primary)   Treatment  1. PMB (postmenopausal bleeding)  Notes: Pt declines in office EMB due to significant pain she had with the previous EMB. Consents to Hyst/D&C,Myosure. Pelvic ultrasound ordered. Discussed importance of proceeding with surgery to rule out endometrial cancer.    2. Others  Action Started- Needs surgery ASAP

## 2017-09-30 NOTE — Op Note (Signed)
[name], [name] [name] NO.:  [id number]  MEDICAL RECORD NO.:  [id number]  LOCATION:  [facility]                          FACILITY:  [facility]  PHYSICIAN:  Jola Schmidt, MD   DATE OF BIRTH:  31-Jul-1961  DATE OF PROCEDURE:  09/30/2017 DATE OF DISCHARGE:                              OPERATIVE REPORT   PREOPERATIVE DIAGNOSIS:  Postmenopausal bleeding.  POSTOPERATIVE DIAGNOSES: 1. Postmenopausal bleeding. 2. Endometrial polyp and fibroid.  SURGEON:  Jola Schmidt, MD.  ASSISTANT:  None.  PROCEDURES: 1. Hysteroscopy. 2. D and C with MyoSure myomectomy. 3. Removal of endometrial polyp.  ANESTHESIA:  Local and general.  ESTIMATED BLOOD LOSS:  Minimal.  BLOOD ADMINISTERED:  None.  DRAINS:  None.  LOCAL:  Xylocaine 10 mL, vasopressin 20 and 50, 6 mL.  SPECIMEN:  Endometrial polyp, fibroid, curettings.  DISPOSITION OF SPECIMENS:  To Pathology.  PATIENT DISPOSITION:  To PACU, hemodynamically stable.  COMPLICATIONS:  None.  FINDINGS:  Posterior submucosal fibroid and small fundal endometrial polyp.  Endometrium appeared normal.  DESCRIPTION OF PROCEDURE:  The patient was taken to the operating room. After being consented in the holding area, she underwent LMA anesthesia in the dorsal lithotomy position, was prepped and draped in a normal sterile fashion.  Time-out was performed.  SCDs were on her legs and operating.  A Graves speculum was inserted into the vagina.  The cervix was easily visualized.  She was dilated up to 7.  Cervix was tight stenotic.  I easily advanced the hysteroscope to find the findings noted above.  The camera was then removed and vasopressin was injected at 3 and 9 o'clock of the cervix.  Hysteroscope was then readvanced and using the MyoSure, the polyp was removed, and the fibroid was  with the Reach blade.  Once the specimen was removed, the camera was removed and sharp curettage of the uterus was performed.  There were scant  endometrial curettings.  Tenaculum was removed.  Tenaculum site was hemostatic.  There was no bleeding noted.  All instruments were removed from the vagina.  All instrument and sponge counts were correct x3.  The patient was taken to the recovery room in stable condition.     Jola Schmidt, MD     EBV/MEDQ  D:  09/30/2017  T:  09/30/2017  Job:  [id number]

## 2017-09-30 NOTE — Anesthesia Postprocedure Evaluation (Signed)
Anesthesia Post Note  Patient: SHARANDA SHINAULT  Procedure(s) Performed: DILATATION & CURETTAGE/HYSTEROSCOPY WITH MYOSURE MYOMECTOMY (N/A Vagina )     Patient location during evaluation: PACU Anesthesia Type: General Level of consciousness: awake and alert Pain management: pain level controlled Vital Signs Assessment: post-procedure vital signs reviewed and stable Respiratory status: spontaneous breathing, nonlabored ventilation and respiratory function stable Cardiovascular status: blood pressure returned to baseline and stable Postop Assessment: no apparent nausea or vomiting Anesthetic complications: no    Last Vitals:  Vitals:   09/30/17 0921 09/30/17 0922  BP:    Pulse: 79 67  Resp: (!) 22 (!) 21  Temp:  36.6 C  SpO2: 96% 96%    Last Pain:  Vitals:   09/30/17 0922  TempSrc: Oral   Pain Goal: Patients Stated Pain Goal: 2 (09/30/17 8182)               Lynda Rainwater

## 2017-09-30 NOTE — Anesthesia Procedure Notes (Signed)
Procedure Name: LMA Insertion Date/Time: 09/30/2017 7:34 AM Performed by: Georgeanne Nim Pre-anesthesia Checklist: Patient identified, Emergency Drugs available, Suction available, Patient being monitored and Timeout performed Patient Re-evaluated:Patient Re-evaluated prior to induction Oxygen Delivery Method: Circle system utilized Preoxygenation: Pre-oxygenation with 100% oxygen Induction Type: IV induction Ventilation: Mask ventilation without difficulty LMA: LMA with gastric port inserted LMA Size: 3.0 Placement Confirmation: positive ETCO2,  CO2 detector and breath sounds checked- equal and bilateral Tube secured with: Tape Dental Injury: Teeth and Oropharynx as per pre-operative assessment

## 2017-09-30 NOTE — Brief Op Note (Signed)
09/30/2017  8:14 AM  PATIENT:  [name] [name]  56 y.o. female  PRE-OPERATIVE DIAGNOSIS:  N95.0 PMB  POST-OPERATIVE DIAGNOSIS:  Endometrial polyp, Fibroid  PROCEDURE:  Procedure(s) with comments: DILATATION & CURETTAGE/HYSTEROSCOPY WITH MYOSURE (N/A) - PMB Myomectomy, Polypectomy  SURGEON:  Surgeon(s) and Role:    Thurnell Lose, MD - Primary  PHYSICIAN ASSISTANT:   ASSISTANTS: none   ANESTHESIA:   local and general  EBL:  No intake/output data recorded.  BLOOD ADMINISTERED:none  DRAINS: none   LOCAL MEDICATIONS USED:  XYLOCAINE , Amount: 10 ml and OTHER Vasopressin 20/50 6 ml  SPECIMEN:  Source of Specimen:  Endometrial polyp, fibroid, endometrial currettings  DISPOSITION OF SPECIMEN:  PATHOLOGY  COUNTS:  YES  TOURNIQUET:  * No tourniquets in log *  DICTATION: .Other Dictation: Dictation Number [id number]  PLAN OF CARE: Discharge to home after PACU  PATIENT DISPOSITION:  PACU - hemodynamically stable.   Delay start of Pharmacological VTE agent (>24hrs) due to surgical blood loss or risk of bleeding: yes

## 2017-09-30 NOTE — Discharge Instructions (Addendum)
DISCHARGE INSTRUCTIONS: D&C / D&E The following instructions have been prepared to help you care for yourself upon your return home.   Personal hygiene:  Use sanitary pads for vaginal drainage, not tampons.  Shower the day after your procedure.  NO tub baths, pools or Jacuzzis for 2-3 weeks.  Wipe front to back after using the bathroom.  Activity and limitations:  Do NOT drive or operate any equipment for 24 hours. The effects of anesthesia are still present and drowsiness may result.  Do NOT rest in bed all day.  Walking is encouraged.  Walk up and down stairs slowly.  You may resume your normal activity in one to two days or as indicated by your physician.  Sexual activity: NO intercourse for at least 2 weeks after the procedure, or as indicated by your physician.  Diet: Eat a light meal as desired this evening. You may resume your usual diet tomorrow.  Return to work: You may resume your work activities in one to two days or as indicated by your doctor.  What to expect after your surgery: Expect to have vaginal bleeding/discharge for 2-3 days and spotting for up to 10 days. It is not unusual to have soreness for up to 1-2 weeks. You may have a slight burning sensation when you urinate for the first day. Mild cramps may continue for a couple of days. You may have a regular period in 2-6 weeks.  Call your doctor for any of the following:  Excessive vaginal bleeding, saturating and changing one pad every hour.  Inability to urinate 6 hours after discharge from hospital.  Pain not relieved by pain medication.  Fever of 100.4 F or greater.  Unusual vaginal discharge or odor.   Call for an appointment:    Patients signature: ______________________  Nurses signature ________________________  Support person's signature_______________________   Dilation and Curettage or Vacuum Curettage, Care After This sheet gives you information about how to care for yourself  after your procedure. Your health care provider may also give you more specific instructions. If you have problems or questions, contact your health care provider. What can I expect after the procedure? After your procedure, it is common to have:  Mild pain or cramping.  Some vaginal bleeding or spotting.  These may last for up to 2 weeks after your procedure. Follow these instructions at home: Activity   Do not drive or use heavy machinery while taking prescription pain medicine.  Avoid driving for the first 24 hours after your procedure.  Take frequent, short walks, followed by rest periods, throughout the day. Ask your health care provider what activities are safe for you. After 1-2 days, you may be able to return to your normal activities.  Do not lift anything heavier than 10 lb (4.5 kg) until your health care provider approves.  For at least 2 weeks, or as long as told by your health care provider, do not: ? Douche. ? Use tampons. ? Have sexual intercourse. General instructions   Take over-the-counter and prescription medicines only as told by your health care provider. This is especially important if you take blood thinning medicine.  Do not take baths, swim, or use a hot tub until your health care provider approves. Take showers instead of baths.  Wear compression stockings as told by your health care provider. These stockings help to prevent blood clots and reduce swelling in your legs.  It is your responsibility to get the results of your procedure. Ask your health  care provider, or the department performing the procedure, when your results will be ready.  Keep all follow-up visits as told by your health care provider. This is important. Contact a health care provider if:  You have severe cramps that get worse or that do not get better with medicine.  You have severe abdominal pain.  You cannot drink fluids without vomiting.  You develop pain in a different area  of your pelvis.  You have bad-smelling vaginal discharge.  You have a rash. Get help right away if:  You have vaginal bleeding that soaks more than one sanitary pad in 1 hour, for 2 hours in a row.  You pass large blood clots from your vagina.  You have a fever that is above 100.64F (38.0C).  Your abdomen feels very tender or hard.  You have chest pain.  You have shortness of breath.  You cough up blood.  You feel dizzy or light-headed.  You faint.  You have pain in your neck or shoulder area. This information is not intended to replace advice given to you by your health care provider. Make sure you discuss any questions you have with your health care provider. Document Released: 11/29/2000 Document Revised: 07/31/2016 Document Reviewed: 07/04/2016 Elsevier Interactive Patient Education  [employer].

## 2017-09-30 NOTE — Anesthesia Preprocedure Evaluation (Signed)
Anesthesia Evaluation  Patient identified by MRN, date of birth, ID band Patient awake    Reviewed: Allergy & Precautions, NPO status , Patient's Chart, lab work & pertinent test results  Airway Mallampati: II  TM Distance: >3 FB Neck ROM: Full    Dental no notable dental hx.    Pulmonary neg pulmonary ROS, COPD, Current Smoker,    Pulmonary exam normal breath sounds clear to auscultation       Cardiovascular negative cardio ROS Normal cardiovascular exam Rhythm:Regular Rate:Normal     Neuro/Psych negative neurological ROS  negative psych ROS   GI/Hepatic negative GI ROS, Neg liver ROS, GERD  ,  Endo/Other  negative endocrine ROS  Renal/GU negative Renal ROS  negative genitourinary   Musculoskeletal negative musculoskeletal ROS (+) Arthritis , Osteoarthritis,    Abdominal   Peds negative pediatric ROS (+)  Hematology negative hematology ROS (+)   Anesthesia Other Findings   Reproductive/Obstetrics negative OB ROS                             Anesthesia Physical Anesthesia Plan  ASA: II  Anesthesia Plan: General   Post-op Pain Management:    Induction: Intravenous  PONV Risk Score and Plan: 2 and Ondansetron and Midazolam  Airway Management Planned: LMA  Additional Equipment:   Intra-op Plan:   Post-operative Plan: Extubation in OR  Informed Consent: I have reviewed the patients History and Physical, chart, labs and discussed the procedure including the risks, benefits and alternatives for the proposed anesthesia with the patient or authorized representative who has indicated his/her understanding and acceptance.   Dental advisory given  Plan Discussed with: CRNA  Anesthesia Plan Comments:         Anesthesia Quick Evaluation

## 2017-09-30 NOTE — Transfer of Care (Signed)
Immediate Anesthesia Transfer of Care Note  Patient: [name] [name]  Procedure(s) Performed: DILATATION & CURETTAGE/HYSTEROSCOPY WITH MYOSURE (N/A Vagina )  Patient Location: PACU  Anesthesia Type:General  Level of Consciousness: awake, alert  and patient cooperative  Airway & Oxygen Therapy: Patient Spontanous Breathing and Patient connected to nasal cannula oxygen  Post-op Assessment: Report given to RN and Post -op Vital signs reviewed and stable  Post vital signs: Reviewed and stable  Last Vitals:  Vitals:   09/30/17 0633  BP: (!) 121/102  Pulse: 93  Resp: 16  Temp: (!) 36.4 C  SpO2: 98%    Last Pain:  Vitals:   09/30/17 0633  TempSrc: Oral      Patients Stated Pain Goal: 2 (99/37/16 9678)  Complications: No apparent anesthesia complications

## 2017-10-01 ENCOUNTER — Encounter (HOSPITAL_COMMUNITY): Payer: Self-pay | Admitting: Obstetrics and Gynecology

## 2017-10-08 ENCOUNTER — Telehealth: Payer: Self-pay | Admitting: Pulmonary Disease

## 2017-10-08 NOTE — Telephone Encounter (Signed)
Left message for patient to call back  

## 2017-10-09 NOTE — Telephone Encounter (Signed)
Pt is aware of BQ's recommendations and voiced her understanding.  Nothing further needed.

## 2017-10-09 NOTE — Telephone Encounter (Signed)
Called and spoke with pt and she stated that she was not able to sleep at night---she said that she was having vivid dreams.  She has stopped this medication and she stated that her breathing felt a little bit shorter than normal.  Will forward to BQ to make him aware.

## 2017-10-09 NOTE — Telephone Encounter (Signed)
[place] stay off of it

## 2017-11-25 ENCOUNTER — Encounter: Payer: Self-pay | Admitting: Podiatry

## 2017-11-25 ENCOUNTER — Ambulatory Visit (INDEPENDENT_AMBULATORY_CARE_PROVIDER_SITE_OTHER): Payer: Medicare Other | Admitting: Podiatry

## 2017-11-25 VITALS — BP 121/50 | HR 117 | Resp 18

## 2017-11-25 DIAGNOSIS — B351 Tinea unguium: Secondary | ICD-10-CM | POA: Diagnosis not present

## 2017-11-25 DIAGNOSIS — M79675 Pain in left toe(s): Secondary | ICD-10-CM | POA: Diagnosis not present

## 2017-11-25 DIAGNOSIS — M79674 Pain in right toe(s): Secondary | ICD-10-CM | POA: Diagnosis not present

## 2017-11-25 DIAGNOSIS — L603 Nail dystrophy: Secondary | ICD-10-CM | POA: Diagnosis not present

## 2017-11-25 NOTE — Progress Notes (Signed)
Subjective:    Patient ID: [name] [name], female    DOB: 1961-10-28, 56 y.o.   MRN: [id number]  HPI  56 year old female presents the office today for concerns of her left big toenail cracking she states that her other big toenail on the right side instructed the same thing.  She denies any drainage or pus.  All of her nails occasionally become tender with pressure in shoes and there also gets thick and discolored as well.  Denies any redness or drainage coming from the toenail sites.  She said no recent treatment for this and this is been ongoing for about 4 months.  She has no other concerns today.   Review of Systems  All other systems reviewed and are negative.  Past Medical History:  Diagnosis Date  . Anemia   . Anxiety   . Arthritis    OSTEOARTHRITIS  . Blood dyscrasia    EASY BLEEDER  . COPD (chronic obstructive pulmonary disease) ([place])   . Depression   . Osteoporosis   . Seasonal allergies   . Sickle cell trait [facility])     Past Surgical History:  Procedure Laterality Date  . CESAREAN SECTION  1980  . DILATATION & CURETTAGE/HYSTEROSCOPY WITH MYOSURE N/A 09/30/2017   Procedure: DILATATION & CURETTAGE/HYSTEROSCOPY WITH MYOSURE MYOMECTOMY;  Surgeon: Thurnell Lose, MD;  Location: [facility];  Service: Gynecology;  Laterality: N/A;  PMB     Current Outpatient Medications:  .  albuterol (PROVENTIL HFA) 108 (90 Base) MCG/ACT inhaler, Inhale 1 puff into the lungs every 6 (six) hours as needed for wheezing or shortness of breath. (Patient taking differently: Inhale 2 puffs into the lungs every 6 (six) hours as needed for wheezing or shortness of breath. ), Disp: 1 Inhaler, Rfl: 5 .  albuterol (PROVENTIL) (2.5 MG/3ML) 0.083% nebulizer solution, Take 3 mLs (2.5 mg total) by nebulization every 6 (six) hours as needed for wheezing or shortness of breath., Disp: 360 mL, Rfl: 12 .  buPROPion (WELLBUTRIN SR) 100 MG 12 hr tablet, Take 100 mg by mouth 2 (two) times daily.  , Disp: , Rfl:  .   ibuprofen (ADVIL,MOTRIN) 600 MG tablet, Take 1 tablet (600 mg total) by mouth every 6 (six) hours as needed., Disp: 15 tablet, Rfl: 0 .  Multiple Vitamin (MULTIVITAMIN) capsule, Take 1 capsule by mouth daily., Disp: , Rfl:  .  OXYGEN, Inhale 1 L into the lungs as needed (for shortness of breath)., Disp: , Rfl:  .  raloxifene (EVISTA) 60 MG tablet, Take 60 mg by mouth daily.  , Disp: , Rfl:  .  salmeterol (SEREVENT DISKUS) 50 MCG/DOSE diskus inhaler, Inhale 1 puff into the lungs 2 (two) times daily., Disp: 1 Inhaler, Rfl: 5 .  SPIRIVA HANDIHALER 18 MCG inhalation capsule, PLACE 1 CAPSULE INTO INHALER AND INHALE ONCE DAILY, Disp: 30 capsule, Rfl: 11 .  Tetrahydrozoline HCl (VISINE OP), Place 1 drop into both eyes as needed (for dry eyes)., Disp: , Rfl:  .  varenicline (CHANTIX PAK) 0.5 MG X 11 & 1 MG X 42 tablet, Take one 0.5 mg tab once daily X 3 days, then increase to one 0.5 mg tab twice daily X 4 days, then increase to one 1 mg tab twice daily., Disp: 53 tablet, Rfl: 0  No Known Allergies  Social History   Socioeconomic History  . Marital status: Single    Spouse name: Not on file  . Number of children: Not on file  . Years of education: Not  on file  . Highest education level: Not on file  Social Needs  . Financial resource strain: Not on file  . Food insecurity - worry: Not on file  . Food insecurity - inability: Not on file  . Transportation needs - medical: Not on file  . Transportation needs - non-medical: Not on file  Occupational History  . Not on file  Tobacco Use  . Smoking status: Current Some Day Smoker    Packs/day: 0.50    Years: 42.00    Pack years: 21.00    Types: Cigarettes  . Smokeless tobacco: Never Used  Substance and Sexual Activity  . Alcohol use: Yes    Alcohol/week: 0.0 oz    Comment: occasional  . Drug use: No  . Sexual activity: Not on file  Other Topics Concern  . Not on file  Social History Narrative   ** Merged History Encounter **              Objective:   Physical Exam  General: AAO x3, NAD  Dermatological: Nails are hypertrophic, dystrophic, brittle, discolored, elongated 10.There is longitudinal cracking of the left hallux toenail and there is incurvation of both of her hallux toenails.  No surrounding redness or drainage. Tenderness nails 1-5 bilaterally. No open lesions or pre-ulcerative lesions are identified today.  Vascular: Dorsalis Pedis artery and Posterior Tibial artery pedal pulses are 2/4 bilateral with immedate capillary fill time.  There is no pain with calf compression, swelling, warmth, erythema.  Denies any claudication symptoms.  Neruologic: Grossly intact via light touch bilateral. Protective threshold with Semmes Wienstein monofilament intact to all pedal sites bilateral.   Musculoskeletal: No gross boney pedal deformities bilateral. No pain, crepitus, or limitation noted with foot and ankle range of motion bilateral. Muscular strength 5/5 in all groups tested bilateral.  Gait: Unassisted, Nonantalgic.     Assessment & Plan:  Symptomatic onychomycosis with left hallux nail splitting -Treatment options discussed including all alternatives, risks, and complications -Etiology of symptoms were discussed -Discussed treatment options for this bleeding and the thickening of the toenails.  As the nails are not tender except for the tips of the nails I recommended to hold off on nail avulsion today.  I did discuss treatment options for this as well.  We will start with topical antifungal and I order this today through [facility].   -Nails debrided 10 without complications or bleeding. -Daily foot inspection -Follow-up in 3 months or sooner if any problems arise. In the meantime, encouraged to call the office with any questions, concerns, change in symptoms.   Celesta Gentile, DPM

## 2017-11-25 NOTE — Patient Instructions (Signed)

## 2017-12-01 ENCOUNTER — Telehealth: Payer: Self-pay | Admitting: Podiatry

## 2017-12-01 NOTE — Telephone Encounter (Signed)
I was calling to let Dr. Jacqualyn Posey know I still haven't gotten my oral for my toes yet. Call me back if you need to at [phone]. Thank you.

## 2017-12-01 NOTE — Telephone Encounter (Signed)
I think she is referring to the compound cream that I ordered from [facility] for the nail fungus.

## 2017-12-02 NOTE — Telephone Encounter (Signed)
I informed pt of Dr. Leigh Aurora orders for topical anti-fungal from [facility] [phone], and they would discuss insurance coverage and delivery of the medication. Pt states understanding.

## 2017-12-24 ENCOUNTER — Encounter: Payer: Self-pay | Admitting: Acute Care

## 2018-01-08 ENCOUNTER — Telehealth: Payer: Self-pay | Admitting: Pulmonary Disease

## 2018-01-08 ENCOUNTER — Encounter: Payer: Self-pay | Admitting: Pulmonary Disease

## 2018-01-08 ENCOUNTER — Ambulatory Visit (INDEPENDENT_AMBULATORY_CARE_PROVIDER_SITE_OTHER): Payer: Medicare Other | Admitting: Pulmonary Disease

## 2018-01-08 VITALS — BP 116/78 | HR 88 | Ht 64.5 in | Wt 138.0 lb

## 2018-01-08 DIAGNOSIS — J9611 Chronic respiratory failure with hypoxia: Secondary | ICD-10-CM

## 2018-01-08 DIAGNOSIS — Z720 Tobacco use: Secondary | ICD-10-CM

## 2018-01-08 DIAGNOSIS — J439 Emphysema, unspecified: Secondary | ICD-10-CM | POA: Diagnosis not present

## 2018-01-08 NOTE — Telephone Encounter (Signed)
Walk was done today in office visit. Snapshot updated, message sent to [facility] to make aware.  Will close encounter.

## 2018-01-08 NOTE — Progress Notes (Signed)
Subjective:    Patient ID: [name] [name], female    DOB: 05-14-1961, 57 y.o.   MRN: [id number]  Synopsis: First referred in 2017 for evaluation of COPD. 2016 pulmonary function testing from [facility] showed FEV1 of 870 mL (37% predicted) consistent with severe airflow obstruction 2015 CT scan from [facility] showed severe panlobular emphysema She is prescribed 2 L O2 qHS and she sometimes uses a portable tank. She continues to smoke cigarettes now.  She quit smoking for one year in 2013 with Chantix.  She tried taking it again when she started back but it didn't help. She smokes 1/2 ppd as of 2017 but she thinks she smoked more.  She started smoking at age 60.    HPI Chief Complaint  Patient presents with  . Follow-up    c/o increased SOB with any exertion.  denies cough, mucus production.    [name] [name]'s mother died in 2023/10/04 due to heart failure.  Her holidays were sad because of this.    Dyspnea: worsening > she says that serevent helped initially but she feels worse now > she says taht she is getting dyspnea faster with walking compared to prior > she can walk on level ground for a mile at a slow pace but the dyspnea comes on faster now > she is not feeling more congested or wheezing > no chest pain, no leg swelling > she walks a lot daily for exercise > she does some sit to stand  Tobacco abuse: > she tried Chantix for a third time recently and it didn't help > she is still smoking 1/2 ppd > she wants to try a nicotine patch  Chronic respiratory failure with hypoxemia: > she doesn't use her portable oxygen often due to the weight > she uses her oxygen at home on her concentrator and it helps  Past Medical History:  Diagnosis Date  . Anemia   . Anxiety   . Arthritis    OSTEOARTHRITIS  . Blood dyscrasia    EASY BLEEDER  . COPD (chronic obstructive pulmonary disease) ([place])   . Depression   . Osteoporosis   . Seasonal allergies   . Sickle cell trait ([place])       Review of  Systems  Constitutional: Negative for chills, fatigue and fever.  HENT: Negative for rhinorrhea, sinus pressure and sneezing.   Respiratory: Positive for shortness of breath. Negative for cough, wheezing and stridor.   Cardiovascular: Negative for chest pain, palpitations and leg swelling.       Objective:   Physical Exam Vitals:   01/08/18 1005  BP: 116/78  Pulse: 88  SpO2: 100%  Weight: 138 lb (62.6 kg)  Height: 5' 4.5" (1.638 m)   RA  Gen: well appearing HENT: OP clear, TM's clear, neck supple PULM: Poor air movement B, normal percussion CV: RRR, no mgr, trace edema GI: BS+, soft, nontender Derm: no cyanosis or rash Psyche: normal mood and affect   December 2017 records from her visit here reviewed were a PET scan was arranged to follow-up an abnormal CT finding from her chest.  January 2018 PET CT showed scattered sclerotic lesions involving the thoracic or lumbar spine and bilateral pelvis non-FDG avid felt to be benign. Images independently reviewed also showing significant centrilobular emphysema.  August 2018 chest x-ray showed severe bullous emphysema some atelectasis in the bases but no other changes.     Assessment & Plan:   Tobacco abuse  Pulmonary emphysema, unspecified emphysema type (HCC)  Chronic  respiratory failure with hypoxia (HCC)  Discussion: She has severe COPD with severe symptoms.  She describes worsening dyspnea recently in the setting of ongoing tobacco abuse.  She is also not using her oxygen because of the weight of the oxygen tank.  She would like to use a portable oxygen concentrator.  I think the most obvious causes of her worsening shortness of breath or ongoing tobacco use and not using exertional oxygen.  We counseled her today on the significance of ongoing tobacco use and I advised that this will continue to make her COPD worse.  We provided her with information on smoking cessation help from the community.  We will also prescribe a  portable oxygen concentrator as I think if she use this while walking she will feel better.  However, if she still having trouble breathing on the next visit after quitting smoking and using portable oxygen more frequently she may need to have a repeat pulmonary function test or consider enrollment in pulmonary rehab.  Plan: Severe COPD with daily symptoms: Continue Serevent Continue Spiriva Stop smoking  Chronic respiratory failure with hypoxemia: Continue using 2 L of oxygen with exertion, we will prescribe a portable oxygen concentrator and I would like for you to use this every time you get around and walk.  Tobacco abuse: Stop smoking right away Refer to the smoking cessation sheet we gave you today Use nicotine patches provided by your insurance company  We will see you back in 4-6 weeks with one of our nurse practitioners  > 50% of this 26 minute visit spent face to face    Current Outpatient Medications:  .  albuterol (PROVENTIL HFA) 108 (90 Base) MCG/ACT inhaler, Inhale 1 puff into the lungs every 6 (six) hours as needed for wheezing or shortness of breath. (Patient taking differently: Inhale 2 puffs into the lungs every 6 (six) hours as needed for wheezing or shortness of breath. ), Disp: 1 Inhaler, Rfl: 5 .  albuterol (PROVENTIL) (2.5 MG/3ML) 0.083% nebulizer solution, Take 3 mLs (2.5 mg total) by nebulization every 6 (six) hours as needed for wheezing or shortness of breath., Disp: 360 mL, Rfl: 12 .  buPROPion (WELLBUTRIN SR) 100 MG 12 hr tablet, Take 100 mg by mouth 2 (two) times daily.  , Disp: , Rfl:  .  ibuprofen (ADVIL,MOTRIN) 600 MG tablet, Take 1 tablet (600 mg total) by mouth every 6 (six) hours as needed., Disp: 15 tablet, Rfl: 0 .  Multiple Vitamin (MULTIVITAMIN) capsule, Take 1 capsule by mouth daily., Disp: , Rfl:  .  NON FORMULARY, [facility]  Onychomycosis Nail Lacquer -  Fluconazole 2%, Terbinafine 1% DMSO Apply to affected nail once daily Qty. 120 gm 3  refills, Disp: , Rfl:  .  OXYGEN, Inhale 1 L into the lungs as needed (for shortness of breath)., Disp: , Rfl:  .  raloxifene (EVISTA) 60 MG tablet, Take 60 mg by mouth daily.  , Disp: , Rfl:  .  salmeterol (SEREVENT DISKUS) 50 MCG/DOSE diskus inhaler, Inhale 1 puff into the lungs 2 (two) times daily., Disp: 1 Inhaler, Rfl: 5 .  SPIRIVA HANDIHALER 18 MCG inhalation capsule, PLACE 1 CAPSULE INTO INHALER AND INHALE ONCE DAILY, Disp: 30 capsule, Rfl: 11 .  Tetrahydrozoline HCl (VISINE OP), Place 1 drop into both eyes as needed (for dry eyes)., Disp: , Rfl:

## 2018-01-08 NOTE — Addendum Note (Signed)
Addended by: Len Blalock on: 01/08/2018 10:37 AM   Modules accepted: Orders

## 2018-01-08 NOTE — Patient Instructions (Signed)
Severe COPD with daily symptoms: Continue Serevent Continue Spiriva Stop smoking  Chronic respiratory failure with hypoxemia: Continue using 2 L of oxygen with exertion, we will prescribe a portable oxygen concentrator and I would like for you to use this every time you get around and walk.  Tobacco abuse: Stop smoking right away Refer to the smoking cessation sheet we gave you today Use nicotine patches provided by your insurance company  We will see you back in 4-6 weeks with one of our nurse practitioners

## 2018-01-20 ENCOUNTER — Telehealth: Payer: Self-pay | Admitting: Pulmonary Disease

## 2018-01-20 NOTE — Telephone Encounter (Signed)
I need to review this first

## 2018-01-20 NOTE — Telephone Encounter (Signed)
It appears that this form is to give BQ permission to apply for pt's insurance on her behalf.. Form placed in BQ's look-at folder.  BQ please advise if you're willing to sign this form.  Thanks.

## 2018-01-21 NOTE — Telephone Encounter (Signed)
Per BQ- This form is to give BQ permission to apply patient for Medicaid insurance.  This is not appropriate for a provider to do.  This is more geared toward a friend or family member.  lmtcb for pt to make aware.

## 2018-01-22 NOTE — Telephone Encounter (Signed)
Spoke to pt and made aware of BQ's recs.  Form left up front in brown accordion folder for pickup.  Nothing further needed.

## 2018-02-24 ENCOUNTER — Ambulatory Visit: Payer: Medicare Other | Admitting: Podiatry

## 2018-02-25 ENCOUNTER — Ambulatory Visit: Payer: Medicare Other | Admitting: Pulmonary Disease

## 2018-03-17 ENCOUNTER — Ambulatory Visit (INDEPENDENT_AMBULATORY_CARE_PROVIDER_SITE_OTHER): Payer: Medicare Other | Admitting: Pulmonary Disease

## 2018-03-17 ENCOUNTER — Encounter: Payer: Self-pay | Admitting: Pulmonary Disease

## 2018-03-17 VITALS — BP 144/76 | HR 89 | Ht 64.5 in | Wt 133.8 lb

## 2018-03-17 DIAGNOSIS — J9611 Chronic respiratory failure with hypoxia: Secondary | ICD-10-CM

## 2018-03-17 DIAGNOSIS — J439 Emphysema, unspecified: Secondary | ICD-10-CM | POA: Diagnosis not present

## 2018-03-17 DIAGNOSIS — Z720 Tobacco use: Secondary | ICD-10-CM

## 2018-03-17 NOTE — Patient Instructions (Addendum)
Severe COPD with bullous emphysema: Continue using Serevent and Spiriva daily Use albuterol as needed for chest tightness wheezing or shortness of breath, you can take this up to every 4-6 hours as needed  Chronic respiratory failure with hypoxemia: Continue using 2 L of oxygen on exertion  Tobacco abuse: Quit smoking  Anxiety/depression: If you decide you want to talk to a psychiatrist or therapist please let me know  We will see you back in 4-6 months or sooner if needed

## 2018-03-17 NOTE — Progress Notes (Signed)
Subjective:    Patient ID: [name] [name], female    DOB: March 05, 1961, 57 y.o.   MRN: [id number]  Synopsis: First referred in 2017 for evaluation of COPD. 2016 pulmonary function testing from [facility] showed FEV1 of 870 mL (37% predicted) consistent with severe airflow obstruction 2015 CT scan from [facility] showed severe panlobular emphysema She is prescribed 2 L O2 qHS and she sometimes uses a portable tank. She continues to smoke cigarettes now.  She quit smoking for one year in 2013 with Chantix.  She tried taking it again when she started back but it didn't help. She smokes 1/2 ppd as of 2017 but she thinks she smoked more.  She started smoking at age 36.    HPI Chief Complaint  Patient presents with  . Follow-up    pt states she is at baseline currently.    She has been using and benefitting from her oxygen.  She is feeling better right now.  She has been walking on her treadmill trying to avoid her pollen.  She has been walking 30 minutes a day.  She has been wearing oxygen when she walks.  No cough or mucus production right now.  No problems with serevent and spiriva.  She is still smoking, down from before, now smoking 1/2 ppd.  She is not seeing anyone for her anxiety which she says is the main problem that she still uses the cigarettes.  She says that since her mother died she has been feeling down more.    Past Medical History:  Diagnosis Date  . Anemia   . Anxiety   . Arthritis    OSTEOARTHRITIS  . Blood dyscrasia    EASY BLEEDER  . COPD (chronic obstructive pulmonary disease) ([place])   . Depression   . Osteoporosis   . Seasonal allergies   . Sickle cell trait ([place])       Review of Systems  Constitutional: Negative for chills, fatigue and fever.  HENT: Negative for rhinorrhea, sinus pressure and sneezing.   Respiratory: Positive for shortness of breath. Negative for cough, wheezing and stridor.   Cardiovascular: Negative for chest pain, palpitations and leg swelling.      Objective:   Physical Exam Vitals:   03/17/18 1545  BP: (!) 144/76  Pulse: 89  SpO2: 92%  Weight: 133 lb 12.8 oz (60.7 kg)  Height: 5' 4.5" (1.638 m)   RA  Gen: chronically ill appearing HENT: OP clear, TM's clear, neck supple PULM: Poor air movement upper lobes B, normal percussion CV: RRR, no mgr, trace edema GI: BS+, soft, nontender Derm: no cyanosis or rash Psyche: normal mood and affect   December 2017 records from her visit here reviewed were a PET scan was arranged to follow-up an abnormal CT finding from her chest.  January 2018 PET CT showed scattered sclerotic lesions involving the thoracic or lumbar spine and bilateral pelvis non-FDG avid felt to be benign. Images independently reviewed also showing significant centrilobular emphysema.  August 2018 chest x-ray showed severe bullous emphysema some atelectasis in the bases but no other changes.     Assessment & Plan:   Tobacco abuse  Pulmonary emphysema, unspecified emphysema type (HCC)  Chronic respiratory failure with hypoxia (HCC)  Discussion: This has been a stable interval for [name] [name].  She has not had an exacerbation since the last visit and her breathing has improved since she has been using her exertional oxygen.  She is exercising more regularly now.  Her anxiety and  depression have been a bit worse recently after the death of her mother.  I think this makes her smoke more.  I have encouraged her to consider seeing a psychiatrist or therapist and offered a referral today.  She says she is not quite ready for that.  Plan: Severe COPD with bullous emphysema: Continue using Serevent and Spiriva daily Use albuterol as needed for chest tightness wheezing or shortness of breath, you can take this up to every 4-6 hours as needed  Chronic respiratory failure with hypoxemia: Continue using 2 L of oxygen on exertion  Tobacco abuse: Quit smoking  Anxiety/depression: If you decide you want to talk to a  psychiatrist or therapist please let me know  We will see you back in 4-6 months or sooner if needed   Current Outpatient Medications:  .  albuterol (PROVENTIL HFA) 108 (90 Base) MCG/ACT inhaler, Inhale 1 puff into the lungs every 6 (six) hours as needed for wheezing or shortness of breath. (Patient taking differently: Inhale 2 puffs into the lungs every 6 (six) hours as needed for wheezing or shortness of breath. ), Disp: 1 Inhaler, Rfl: 5 .  albuterol (PROVENTIL) (2.5 MG/3ML) 0.083% nebulizer solution, Take 3 mLs (2.5 mg total) by nebulization every 6 (six) hours as needed for wheezing or shortness of breath., Disp: 360 mL, Rfl: 12 .  buPROPion (WELLBUTRIN SR) 100 MG 12 hr tablet, Take 100 mg by mouth 2 (two) times daily.  , Disp: , Rfl:  .  ibuprofen (ADVIL,MOTRIN) 600 MG tablet, Take 1 tablet (600 mg total) by mouth every 6 (six) hours as needed., Disp: 15 tablet, Rfl: 0 .  Multiple Vitamin (MULTIVITAMIN) capsule, Take 1 capsule by mouth daily., Disp: , Rfl:  .  NON FORMULARY, [facility]  Onychomycosis Nail Lacquer -  Fluconazole 2%, Terbinafine 1% DMSO Apply to affected nail once daily Qty. 120 gm 3 refills, Disp: , Rfl:  .  OXYGEN, Inhale 1 L into the lungs as needed (for shortness of breath)., Disp: , Rfl:  .  raloxifene (EVISTA) 60 MG tablet, Take 60 mg by mouth daily.  , Disp: , Rfl:  .  salmeterol (SEREVENT DISKUS) 50 MCG/DOSE diskus inhaler, Inhale 1 puff into the lungs 2 (two) times daily., Disp: 1 Inhaler, Rfl: 5 .  SPIRIVA HANDIHALER 18 MCG inhalation capsule, PLACE 1 CAPSULE INTO INHALER AND INHALE ONCE DAILY, Disp: 30 capsule, Rfl: 11 .  Tetrahydrozoline HCl (VISINE OP), Place 1 drop into both eyes as needed (for dry eyes)., Disp: , Rfl:

## 2018-03-26 ENCOUNTER — Other Ambulatory Visit: Payer: Self-pay | Admitting: Pulmonary Disease

## 2018-05-28 ENCOUNTER — Other Ambulatory Visit: Payer: Self-pay | Admitting: Adult Health

## 2018-06-08 ENCOUNTER — Other Ambulatory Visit: Payer: Self-pay | Admitting: Internal Medicine

## 2018-06-08 ENCOUNTER — Emergency Department (HOSPITAL_COMMUNITY): Payer: Medicare Other

## 2018-06-08 ENCOUNTER — Emergency Department (HOSPITAL_COMMUNITY)
Admission: EM | Admit: 2018-06-08 | Discharge: 2018-06-09 | Disposition: A | Discharge status: Home / Self Care | Payer: Medicare Other | Attending: Emergency Medicine | Admitting: Emergency Medicine

## 2018-06-08 ENCOUNTER — Encounter (HOSPITAL_COMMUNITY): Payer: Self-pay | Admitting: Emergency Medicine

## 2018-06-08 ENCOUNTER — Other Ambulatory Visit: Payer: Self-pay

## 2018-06-08 DIAGNOSIS — R05 Cough: Secondary | ICD-10-CM | POA: Diagnosis present

## 2018-06-08 DIAGNOSIS — F17210 Nicotine dependence, cigarettes, uncomplicated: Secondary | ICD-10-CM | POA: Diagnosis not present

## 2018-06-08 DIAGNOSIS — J302 Other seasonal allergic rhinitis: Secondary | ICD-10-CM | POA: Insufficient documentation

## 2018-06-08 DIAGNOSIS — J449 Chronic obstructive pulmonary disease, unspecified: Secondary | ICD-10-CM | POA: Diagnosis not present

## 2018-06-08 DIAGNOSIS — M8580 Other specified disorders of bone density and structure, unspecified site: Secondary | ICD-10-CM

## 2018-06-08 DIAGNOSIS — Z79899 Other long term (current) drug therapy: Secondary | ICD-10-CM | POA: Insufficient documentation

## 2018-06-08 DIAGNOSIS — R059 Cough, unspecified: Secondary | ICD-10-CM

## 2018-06-08 LAB — CBC WITH DIFFERENTIAL/PLATELET
Abs Immature Granulocytes: 0 10*3/uL (ref 0.0–0.1)
Basophils Absolute: 0.1 10*3/uL (ref 0.0–0.1)
Basophils Relative: 1 %
EOS ABS: 0.2 10*3/uL (ref 0.0–0.7)
EOS PCT: 2 %
HEMATOCRIT: 41.6 % (ref 36.0–46.0)
Hemoglobin: 14.3 g/dL (ref 12.0–15.0)
Immature Granulocytes: 0 %
LYMPHS ABS: 3.9 10*3/uL (ref 0.7–4.0)
Lymphocytes Relative: 41 %
MCH: 28.7 pg (ref 26.0–34.0)
MCHC: 34.4 g/dL (ref 30.0–36.0)
MCV: 83.5 fL (ref 78.0–100.0)
MONO ABS: 0.5 10*3/uL (ref 0.1–1.0)
MONOS PCT: 5 %
Neutro Abs: 4.8 10*3/uL (ref 1.7–7.7)
Neutrophils Relative %: 51 %
Platelets: 221 10*3/uL (ref 150–400)
RBC: 4.98 MIL/uL (ref 3.87–5.11)
RDW: 14.5 % (ref 11.5–15.5)
WBC: 9.4 10*3/uL (ref 4.0–10.5)

## 2018-06-08 LAB — BASIC METABOLIC PANEL
Anion gap: 7 (ref 5–15)
BUN: 20 mg/dL (ref 6–20)
CALCIUM: 9.3 mg/dL (ref 8.9–10.3)
CO2: 24 mmol/L (ref 22–32)
CREATININE: 1.03 mg/dL — AB (ref 0.44–1.00)
Chloride: 109 mmol/L (ref 101–111)
GFR calc Af Amer: >60 mL/min (ref >60)
GFR calc non Af Amer: 58 mL/min — ABNORMAL LOW (ref >60)
GLUCOSE: 120 mg/dL — AB (ref 65–99)
Potassium: 3.9 mmol/L (ref 3.5–5.1)
Sodium: 140 mmol/L (ref 135–145)

## 2018-06-08 NOTE — ED Triage Notes (Signed)
Pt states she has a new prescription of an inhaler, has been using it a lot. Pt states today when she coughs, blood comes out. This stopped 2 hours ago.

## 2018-06-09 ENCOUNTER — Encounter: Payer: Self-pay | Admitting: Internal Medicine

## 2018-06-09 DIAGNOSIS — R05 Cough: Secondary | ICD-10-CM | POA: Diagnosis not present

## 2018-06-09 MED ORDER — MORPHINE SULFATE (PF) 4 MG/ML IV SOLN: 4.0000 mg | Freq: Once | INTRAVENOUS | Status: DC

## 2018-06-09 MED ORDER — SODIUM CHLORIDE 0.9 % IV BOLUS: 1000.0000 mL | Freq: Once | INTRAVENOUS | Status: DC

## 2018-06-09 MED ORDER — ONDANSETRON HCL 4 MG/2ML IJ SOLN: 4.0000 mg | Freq: Once | INTRAMUSCULAR | Status: DC

## 2018-06-09 MED ORDER — AEROCHAMBER PLUS FLO-VU LARGE MISC
Status: AC
  Filled 2018-06-09: qty 1

## 2018-06-09 MED ORDER — AZITHROMYCIN 250 MG PO TABS: 250.0000 mg | ORAL_TABLET | Freq: Every day | ORAL | 0 refills | Status: DC

## 2018-06-09 MED ORDER — ALBUTEROL SULFATE HFA 108 (90 BASE) MCG/ACT IN AERS
2.0000 | INHALATION_SPRAY | RESPIRATORY_TRACT | Status: DC | PRN
  Administered 2018-06-09: 2 via RESPIRATORY_TRACT
  Filled 2018-06-09: qty 6.7

## 2018-06-09 NOTE — ED Notes (Signed)
Signature pad not working. 

## 2018-06-09 NOTE — ED Notes (Addendum)
Pt requesting night time oxygen. Placed on 2L via nasal canula due to last O2 sat on room air.

## 2018-06-09 NOTE — ED Provider Notes (Signed)
[facility] EMERGENCY DEPARTMENT Provider Note   CSN: [id number] Arrival date & time: 06/08/18  2039     History   Chief Complaint Chief Complaint  Patient presents with  . Hemoptysis    HPI [name] [name] is a 57 y.o. female.  Patient presents to the emergency department with a chief complaint of cough.  She reports associated streaky blood in her sputum.  She reports that this started today.  She states that she has had some nasal discharge and postnasal drip and congestion.  She denies any fevers or chills.  She states that she has been out of her albuterol inhaler.  She wears oxygen at home as needed.  She denies any chest pain.  She denies any other associated symptoms.  The history is provided by the patient. No language interpreter was used.    Past Medical History:  Diagnosis Date  . Anemia   . Anxiety   . Arthritis    OSTEOARTHRITIS  . Blood dyscrasia    EASY BLEEDER  . COPD (chronic obstructive pulmonary disease) ([place])   . Depression   . Osteoporosis   . Seasonal allergies   . Sickle cell trait [facility])     Patient Active Problem List   Diagnosis Date Noted  . Protein calorie malnutrition ([place]) 05/21/2017  . Chronic respiratory failure with hypoxia ([place]) 05/21/2017  . Abnormal CT scan 12/11/2016  . Tobacco abuse 02/06/2016  . COPD with emphysema ([place]) 02/06/2016  . OBSTRUCTIVE CHRONIC BRONCHITIS WITHOUT EXACERBAT 12/01/2008  . 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 DEPRESSION 04/05/2008  . RESTRICTIVE LUNG DISEASE 04/05/2008  . GERD 04/05/2008  . DIVERTICULOSIS, COLON 04/05/2008  . IRRITABLE BOWEL SYNDROME 04/05/2008  . HEMORRHOIDS, HX OF 04/05/2008    Past Surgical History:  Procedure Laterality Date  . CESAREAN SECTION  1980  . DILATATION & CURETTAGE/HYSTEROSCOPY WITH MYOSURE N/A 09/30/2017   Procedure: DILATATION & CURETTAGE/HYSTEROSCOPY WITH MYOSURE MYOMECTOMY;  Surgeon: Thurnell Lose, MD;  Location: [facility];  Service: Gynecology;   Laterality: N/A;  PMB     OB History   None      Home Medications    Prior to Admission medications   Medication Sig Start Date End Date Taking? Authorizing Provider  albuterol (PROVENTIL HFA;VENTOLIN HFA) 108 (90 Base) MCG/ACT inhaler Inhale 2 puffs into the lungs every 6 (six) hours as needed for wheezing or shortness of breath. 05/28/18   Juanito Doom, MD  albuterol (PROVENTIL) (2.5 MG/3ML) 0.083% nebulizer solution Take 3 mLs (2.5 mg total) by nebulization every 6 (six) hours as needed for wheezing or shortness of breath. 02/06/16   Juanito Doom, MD  buPROPion (WELLBUTRIN SR) 100 MG 12 hr tablet Take 100 mg by mouth 2 (two) times daily.      [provider]  ibuprofen (ADVIL,MOTRIN) 600 MG tablet Take 1 tablet (600 mg total) by mouth every 6 (six) hours as needed. 09/30/17   Thurnell Lose, MD  Multiple Vitamin (MULTIVITAMIN) capsule Take 1 capsule by mouth daily.    [provider]  NON FORMULARY [facility]  Onychomycosis Nail Lacquer -  Fluconazole 2%, Terbinafine 1% DMSO Apply to affected nail once daily Qty. 120 gm 3 refills    [provider]  OXYGEN Inhale 1 L into the lungs as needed (for shortness of breath).    [provider]  raloxifene (EVISTA) 60 MG tablet Take 60 mg by mouth daily.      [provider]  SEREVENT DISKUS 50  MCG/DOSE diskus inhaler INHALE 1 PUFF INTO LUNGS TWICE DAILY 03/27/18   Juanito Doom, MD  SPIRIVA HANDIHALER 18 MCG inhalation capsule PLACE 1 CAPSULE INTO INHALER AND INHALE ONCE DAILY 06/06/17   Juanito Doom, MD  Tetrahydrozoline HCl (VISINE OP) Place 1 drop into both eyes as needed (for dry eyes).    [provider]    Family History Family History  Problem Relation Age of Onset  . Colon cancer Father 57  . Throat cancer Brother   . Heart disease Mother   . Stomach cancer Neg Hx   . Breast cancer Neg Hx     Social History Social History   Tobacco Use  .  Smoking status: Current Some Day Smoker    Packs/day: 0.50    Years: 42.00    Pack years: 21.00    Types: Cigarettes  . Smokeless tobacco: Never Used  Substance Use Topics  . Alcohol use: Yes    Alcohol/week: 0.0 oz    Comment: occasional  . Drug use: No     Allergies   Patient has no known allergies.   Review of Systems Review of Systems  All other systems reviewed and are negative.    Physical Exam Updated Vital Signs BP (!) 125/92   Pulse 61   Temp 98.3 F (36.8 C)   Resp 18   SpO2 96%   Physical Exam  Constitutional: She is oriented to person, place, and time. She appears well-developed and well-nourished.  HENT:  Head: Normocephalic and atraumatic.  Eyes: Pupils are equal, round, and reactive to light. Conjunctivae and EOM are normal.  Neck: Normal range of motion. Neck supple.  Cardiovascular: Normal rate and regular rhythm. Exam reveals no gallop and no friction rub.  No murmur heard. Pulmonary/Chest: Effort normal and breath sounds normal. No respiratory distress. She has no wheezes. She has no rales. She exhibits no tenderness.  Diminished lung sounds, no wheezes or rales  Abdominal: Soft. Bowel sounds are normal. She exhibits no distension and no mass. There is no tenderness. There is no rebound and no guarding.  Musculoskeletal: Normal range of motion. She exhibits no edema or tenderness.  Neurological: She is alert and oriented to person, place, and time.  Skin: Skin is warm and dry.  Psychiatric: She has a normal mood and affect. Her behavior is normal. Judgment and thought content normal.  Nursing note and vitals reviewed.    ED Treatments / Results  Labs (all labs ordered are listed, but only abnormal results are displayed) Labs Reviewed  BASIC METABOLIC PANEL - Abnormal; Notable for the following components:      Result Value   Glucose, Bld 120 (*)    Creatinine, Ser 1.03 (*)    GFR calc non Af Amer 58 (*)    All other components within  normal limits  CBC WITH DIFFERENTIAL/PLATELET    EKG None  Radiology Dg Chest 2 View  Result Date: 06/08/2018 CLINICAL DATA:  57 year old female with hemoptysis. EXAM: CHEST - 2 VIEW COMPARISON:  PET-CT dated 01/13/2017 FINDINGS: There is emphysematous changes of the lungs with areas of atelectasis/scarring primarily involving the lower lobes. No focal consolidation, pleural effusion, or pneumothorax. The cardiac silhouette is within normal limits. Degenerative changes of the spine. No acute osseous pathology. IMPRESSION: No active cardiopulmonary disease. Emphysema. Electronically Signed   By: Anner Crete M.D.   On: 06/08/2018 22:12    Procedures Procedures (including critical care time)  Medications Ordered in ED Medications  albuterol (PROVENTIL HFA;VENTOLIN HFA) 108 (90 Base) MCG/ACT inhaler 2 puff (has no administration in time range)     Initial Impression / Assessment and Plan / ED Course  I have reviewed the triage vital signs and the nursing notes.  Pertinent labs & imaging results that were available during my care of the patient were reviewed by me and considered in my medical decision making (see chart for details).    Patient with nasal congestion, postnasal drip, and some streaky blood in her sputum.  Likely regular rate and postnasal drip.  Chest x-ray is clear.  Lung sounds are clear.  She is afebrile.  He has been out of her albuterol inhaler.  I will refill this.  She may benefit from a short course of azithromycin.  Recommend close follow-up with her PCP.  She is also concerned about recent weight loss.  I have encouraged her to follow-up with her PCP about this.  Final Clinical Impressions(s) / ED Diagnoses   Final diagnoses:  Cough    ED Discharge Orders        Ordered    azithromycin (ZITHROMAX) 250 MG tablet  Daily     06/09/18 0316       Montine Circle, PA-C 06/09/18 9983    Dina Rich, Barbette Hair, MD 06/09/18 [phone]

## 2018-06-10 ENCOUNTER — Encounter: Payer: Self-pay | Admitting: Internal Medicine

## 2018-06-10 ENCOUNTER — Ambulatory Visit (INDEPENDENT_AMBULATORY_CARE_PROVIDER_SITE_OTHER): Payer: Medicare Other | Admitting: Internal Medicine

## 2018-06-10 ENCOUNTER — Telehealth: Payer: Self-pay | Admitting: Pulmonary Disease

## 2018-06-10 ENCOUNTER — Other Ambulatory Visit: Payer: Self-pay | Admitting: Pulmonary Disease

## 2018-06-10 VITALS — BP 110/66 | HR 97 | Temp 98.9°F | Ht 64.0 in | Wt 129.6 lb

## 2018-06-10 DIAGNOSIS — F17210 Nicotine dependence, cigarettes, uncomplicated: Secondary | ICD-10-CM | POA: Diagnosis not present

## 2018-06-10 DIAGNOSIS — J441 Chronic obstructive pulmonary disease with (acute) exacerbation: Secondary | ICD-10-CM

## 2018-06-10 MED ORDER — PREDNISONE 10 MG PO TABS: ORAL_TABLET | ORAL | 0 refills | Status: DC

## 2018-06-10 MED ORDER — TIOTROPIUM BROMIDE MONOHYDRATE 1.25 MCG/ACT IN AERS: 4.0000 | INHALATION_SPRAY | Freq: Every day | RESPIRATORY_TRACT | Status: DC

## 2018-06-10 NOTE — Telephone Encounter (Signed)
Spoke with [name] [name]. She has been scheduled to see Dr. Melvyn Novas this afternoon at 2:00pm. Nothing further was needed.

## 2018-06-10 NOTE — Progress Notes (Signed)
[name] [name], female    DOB: December 15, 1961,    MRN: [id number]   Synopsis: First referred in 2017 for evaluation of COPD/ active smoker 2016 pulmonary function testing from [facility] showed FEV1 of 870 mL (37% predicted) consistent with severe airflow obstruction 2015 CT scan from [facility] showed severe panlobular emphysema She is prescribed 2 L O2 qHS and she sometimes uses a portable tank.     06/10/2018  Acute extended  ov/Voris Tigert re: post ER eval  06/08/18 dx aecopd/bronchitis rx zpak  / pt with GOLD III criteria / still smoking Chief Complaint  Patient presents with  . Acute Visit    hemoptysis since 06/08/18.    Dyspnea:  MMRC2 = can't walk a nl pace on a flat grade s sob but does fine slow and flat does [employer] 0k s 02 and that hasn't changed since onset of hemoptysis Cough: acute onset  on 06/08/18 mostly mucoid with streaky hemoptysis assoc with lots of nasal congestion but no purulent / bloody d/c   SABA use: has it and says helps but hfa at 0% effective baseline technique   No obvious day to day or daytime variability or assoc cp or chest tightness, subjective wheeze or overt sinus or hb symptoms.   Sleep: various positions   without nocturnal  or early am exacerbation  of respiratory  c/o's or need for noct saba. Also denies any obvious fluctuation of symptoms with weather or environmental changes or other aggravating or alleviating factors except as outlined above   No unusual exposure hx or h/o childhood pna/ asthma or knowledge of premature birth.  Current Allergies, Complete Past Medical History, Past Surgical History, Family History, and Social History were reviewed in [employer] record.  ROS  The following are not active complaints unless bolded Hoarseness, sore throat, dysphagia, dental problems, itching, sneezing,  nasal congestion or discharge of excess mucus or purulent secretions, ear ache,   fever, chills, sweats, unintended wt loss or wt gain,  classically pleuritic or exertional cp,  orthopnea pnd or arm/hand swelling  or leg swelling, presyncope, palpitations, abdominal pain, anorexia, nausea, vomiting, diarrhea  or change in bowel habits or change in bladder habits, change in stools or change in urine, dysuria, hematuria,  rash, arthralgias, visual complaints, headache, numbness, weakness or ataxia or problems with walking or coordination,  change in mood or  memory.           Past Medical History:  Diagnosis Date  . Anemia   . Anxiety   . Arthritis    OSTEOARTHRITIS  . Blood dyscrasia    EASY BLEEDER  . COPD (chronic obstructive pulmonary disease) ([place])   . Depression   . Osteoporosis   . Seasonal allergies   . Sickle cell trait [facility])     Outpatient Medications Prior to Visit  Medication Sig Dispense Refill  . albuterol (PROVENTIL HFA;VENTOLIN HFA) 108 (90 Base) MCG/ACT inhaler Inhale 2 puffs into the lungs every 6 (six) hours as needed for wheezing or shortness of breath. 1 Inhaler 3  . albuterol (PROVENTIL) (2.5 MG/3ML) 0.083% nebulizer solution Take 3 mLs (2.5 mg total) by nebulization every 6 (six) hours as needed for wheezing or shortness of breath. 360 mL 12  . azithromycin (ZITHROMAX) 250 MG tablet Take 1 tablet (250 mg total) by mouth daily. Take first 2 tablets together, then 1 every day until finished. 6 tablet 0  . buPROPion (WELLBUTRIN SR) 100 MG 12 hr tablet Take 100 mg  by mouth 2 (two) times daily.      Marland Kitchen ibuprofen (ADVIL,MOTRIN) 600 MG tablet Take 1 tablet (600 mg total) by mouth every 6 (six) hours as needed. 15 tablet 0  . Multiple Vitamin (MULTIVITAMIN) capsule Take 1 capsule by mouth daily.    Salley Scarlet FORMULARY [facility]  Onychomycosis Nail Lacquer -  Fluconazole 2%, Terbinafine 1% DMSO Apply to affected nail once daily Qty. 120 gm 3 refills    . OXYGEN Inhale 2 L into the lungs as needed (for shortness of breath).     . raloxifene (EVISTA) 60 MG tablet Take 60 mg by mouth daily.      .  SEREVENT DISKUS 50 MCG/DOSE diskus inhaler INHALE 1 PUFF INTO LUNGS TWICE DAILY 60 g 11  . SPIRIVA HANDIHALER 18 MCG inhalation capsule PLACE 1 CAPSULE INTO INHALER AND INHALE ONCE DAILY 30 capsule 2  . Tetrahydrozoline HCl (VISINE OP) Place 1 drop into both eyes as needed (for dry eyes).    . Tiotropium Bromide Monohydrate (SPIRIVA RESPIMAT) 1.25 MCG/ACT AERS Inhale 2 puffs into the lungs daily.     No facility-administered medications prior to visit.              Objective:     BP 110/66 (BP Location: Left Arm, Cuff Size: Normal)   Pulse 97   Temp 98.9 F (37.2 C) (Oral)   Ht 5\' 4"  (1.626 m)   Wt 129 lb 9.6 oz (58.8 kg)   SpO2 90%   BMI 22.25 kg/m   SpO2: 90 % RA   Wt Readings from Last 3 Encounters:  06/10/18 129 lb 9.6 oz (58.8 kg)  03/17/18 133 lb 12.8 oz (60.7 kg)  01/08/18 138 lb (62.6 kg)       HEENT: nl  oropharynx. Nl external ear canals without cough reflex - moderate bilateral non-specific turbinate edema  s purulence or crusting or blood / full dentures   NECK :  without JVD/Nodes/TM/ nl carotid upstrokes bilaterally   LUNGS: no acc muscle use,  Mod barrel  contour chest wall with bilateral  Distant bs c faint pan exp  wheeze and  without cough on insp or exp maneuver and mod   Hyperresonant  to  percussion bilaterally     CV:  RRR  no s3 or murmur or increase in P2, and no edema   ABD:  soft and nontender with pos mid insp Hoover's  in the supine position. No bruits or organomegaly appreciated, bowel sounds nl  MS:   Nl gait/  ext warm without deformities, calf tenderness, cyanosis or clubbing No obvious joint restrictions   SKIN: warm and dry without lesions    NEURO:  alert, approp, nl sensorium with  no motor or cerebellar deficits apparent.       I personally reviewed images and agree with radiology impression as follows:  CXR:   06/08/18  No active cardiopulmonary disease. Emphysema.      Assessment   COPD exacerbation  ([place]) 06/10/2018  After extensive coaching inhaler device  effectiveness =   50% from a baseline of 0     DDX of  difficult airways management almost all start with A and  include Adherence, Ace Inhibitors, Acid Reflux, Active Sinus Disease, Alpha 1 Antitripsin deficiency, Anxiety masquerading as Airways dz,  ABPA,  Allergy(esp in young), Aspiration (esp in elderly), Adverse effects of meds,  Active smokers, A bunch of PE's (a small clot burden can't cause this syndrome unless there is  already severe underlying pulm or vascular dz with poor reserve) plus two Bs  = Bronchiectasis and Beta blocker use..and one C= CHF  Adherence is always the initial "prime suspect" and is a multilayered concern that requires a "trust but verify" approach in every patient - starting with knowing how to use medications, especially inhalers, correctly, keeping up with refills and understanding the fundamental difference between maintenance and prns vs those medications only taken for a very short course and then stopped and not refilled.  - see hfa teaching - rec return with all meds in hand using a trust but verify approach to confirm accurate Medication  Reconciliation The principal here is that until we are certain that the  patients are doing what we've asked, it makes no sense to ask them to do more.   Active smoking greatest concern (see separate a/p)   ? Allergy/asthma component >  pred x 6 days  ? Active sinus dz > on zpak, consider sinus ct if nasal symptoms persist vs 10 days empiric augmentin first   ? Adverse effects of DPI > consider change to [facility] but needs to learn better technique   ? Anxiety/depression  > usually at the bottom of this list of usual suspects but should be   higher on this pt's based on H and P and note already on psychotropics and may interfere with adherence and also interpretation of response or lack thereof to symptom management which can be quite subjective.    I had an extended  discussion with the patient reviewing all relevant studies completed to date and  lasting 25 minutes of a 40  minute acute office visit  With pt new to me    re  severe non-specific but potentially very serious refractory respiratory symptoms of uncertain and potentially multiple  Etiologies.   See device teaching which extended face to face time for this visit   Each maintenance medication was reviewed in detail including most importantly the difference between maintenance and prns and under what circumstances the prns are to be triggered using an action plan format that is not reflected in the computer generated alphabetically organized AVS.    Please see AVS for specific instructions unique to this office visit that I personally wrote and verbalized to the the pt in detail and then reviewed with pt  by my nurse highlighting any changes in therapy/plan of care  recommended at today's visit.        Cigarette smoker 4-5 min discussion re active cigarette smoking in addition to office E&M  Ask about tobacco use:   active Advise quitting   I took an extended  opportunity with this patient to outline the consequences of continued cigarette use  in airway disorders based on all the data we have from the multiple national lung health studies (perfomed over decades at millions of dollars in cost)  indicating that smoking cessation, not choice of inhalers or physicians, is the most important aspect of her care.   Assess willingness:  Not committed at this point Assist in quit attempt:  Per PCP when ready Arrange follow up:   Follow up per Primary Care planned           Christinia Gully, MD 06/10/2018

## 2018-06-10 NOTE — Patient Instructions (Addendum)
Prednisone 10 mg take  4 each am x 2 days,   2 each am x 2 days,  1 each am x 2 days and stop   Finish your antibiotics   Try change spiriva to the respimat 1.25  X 4 pffs each am - no change on serevent for now   Work on inhaler technique:  relax and gently blow all the way out then take a nice smooth deep breath back in, triggering the inhaler at same time you start breathing in.  Hold for up to 5 seconds if you can. Blow out thru nose. Rinse and gargle with water when done     Only use your albuterol (PROVENTIL)  as a rescue medication to be used if you can't catch your breath by resting or doing a relaxed purse lip breathing pattern.  - The less you use it, the better it will work when you need it. - Ok to use up to 2 puffs  every 4 hours if you must but call for immediate appointment if use goes up over your usual need - Don't leave home without it !!  (think of it like the spare tire for your car)    Keep your appt with Dr Lake Bells - call sooner if needed

## 2018-06-11 ENCOUNTER — Encounter: Payer: Self-pay | Admitting: Internal Medicine

## 2018-06-11 DIAGNOSIS — J441 Chronic obstructive pulmonary disease with (acute) exacerbation: Secondary | ICD-10-CM | POA: Insufficient documentation

## 2018-06-11 DIAGNOSIS — J449 Chronic obstructive pulmonary disease, unspecified: Secondary | ICD-10-CM | POA: Insufficient documentation

## 2018-06-11 NOTE — Assessment & Plan Note (Signed)
4-5 min discussion re active cigarette smoking in addition to office E&M  Ask about tobacco use:   active Advise quitting   I took an extended  opportunity with this patient to outline the consequences of continued cigarette use  in airway disorders based on all the data we have from the multiple national lung health studies (perfomed over decades at millions of dollars in cost)  indicating that smoking cessation, not choice of inhalers or physicians, is the most important aspect of her care.   Assess willingness:  Not committed at this point Assist in quit attempt:  Per PCP when ready Arrange follow up:   Follow up per Primary Care planned        

## 2018-06-11 NOTE — Assessment & Plan Note (Signed)
06/10/2018  After extensive coaching inhaler device  effectiveness =   50% from a baseline of 0     DDX of  difficult airways management almost all start with A and  include Adherence, Ace Inhibitors, Acid Reflux, Active Sinus Disease, Alpha 1 Antitripsin deficiency, Anxiety masquerading as Airways dz,  ABPA,  Allergy(esp in young), Aspiration (esp in elderly), Adverse effects of meds,  Active smokers, A bunch of PE's (a small clot burden can't cause this syndrome unless there is already severe underlying pulm or vascular dz with poor reserve) plus two Bs  = Bronchiectasis and Beta blocker use..and one C= CHF  Adherence is always the initial "prime suspect" and is a multilayered concern that requires a "trust but verify" approach in every patient - starting with knowing how to use medications, especially inhalers, correctly, keeping up with refills and understanding the fundamental difference between maintenance and prns vs those medications only taken for a very short course and then stopped and not refilled.  - see hfa teaching - rec return with all meds in hand using a trust but verify approach to confirm accurate Medication  Reconciliation The principal here is that until we are certain that the  patients are doing what we've asked, it makes no sense to ask them to do more.   Active smoking greatest concern (see separate a/p)   ? Allergy/asthma component >  pred x 6 days  ? Active sinus dz > on zpak, consider sinus ct if nasal symptoms persist vs 10 days empiric augmentin first   ? Adverse effects of DPI > consider change to [facility] but needs to learn better technique   ? Anxiety/depression  > usually at the bottom of this list of usual suspects but should be   higher on this pt's based on H and P and note already on psychotropics and may interfere with adherence and also interpretation of response or lack thereof to symptom management which can be quite subjective.    I had an extended discussion  with the patient reviewing all relevant studies completed to date and  lasting 25 minutes of a 40  minute acute office visit  With pt new to me    re  severe non-specific but potentially very serious refractory respiratory symptoms of uncertain and potentially multiple  Etiologies.   See device teaching which extended face to face time for this visit   Each maintenance medication was reviewed in detail including most importantly the difference between maintenance and prns and under what circumstances the prns are to be triggered using an action plan format that is not reflected in the computer generated alphabetically organized AVS.    Please see AVS for specific instructions unique to this office visit that I personally wrote and verbalized to the the pt in detail and then reviewed with pt  by my nurse highlighting any changes in therapy/plan of care  recommended at today's visit.

## 2018-06-15 ENCOUNTER — Telehealth: Payer: Self-pay | Admitting: Pulmonary Disease

## 2018-06-15 DIAGNOSIS — R042 Hemoptysis: Secondary | ICD-10-CM

## 2018-06-15 MED ORDER — AMOXICILLIN-POT CLAVULANATE 875-125 MG PO TABS: 1.0000 | ORAL_TABLET | Freq: Two times a day (BID) | ORAL | 0 refills | Status: DC

## 2018-06-15 NOTE — Telephone Encounter (Signed)
LMTCB on both mobile and home number on file.

## 2018-06-15 NOTE — Telephone Encounter (Signed)
augmentin 875 bid x 5 days CT chest without contrast > hemoptysis Mandatory OV in 1 week with me or an NP to review, may need bronchoscopy if still coughing up blood

## 2018-06-15 NOTE — Telephone Encounter (Signed)
Pt is calling back [phone]

## 2018-06-15 NOTE — Telephone Encounter (Signed)
Pt is aware of below recommendations and voiced her understanding. Rx for augmentin has been sent to preferred pharmacy. CT has been ordered. Pt has been scheduled for OV with Wyn Quaker on 12/24/17 at 11:30a. Nothing further is needed.

## 2018-06-15 NOTE — Telephone Encounter (Signed)
Called and spoke to pt.  Pt stated she completed zpak yesterday that was prescribed on 06/09/18 at ED. Currently on 10mg  of prednisone taper, as prescribed by MW on 06/10/18 Pt stated breathing has improved, however she is still producing clear mucus occ mixed with bright red blood. Denied fever, chills or sweats. pt declined OV.    BQ please advise. Thanks

## 2018-06-15 NOTE — Telephone Encounter (Signed)
If cannot reach her at first number try [phone].

## 2018-06-22 ENCOUNTER — Inpatient Hospital Stay: Admission: RE | Admit: 2018-06-22 | Payer: Medicare Other | Source: Ambulatory Visit

## 2018-06-22 NOTE — Progress Notes (Signed)
@Patient  ID: [name] [name], female    DOB: [birth year], 59 y.o.   MRN: [id number]  Chief Complaint  Patient presents with  . Follow-up    Chest CT today. Wheezing, light headedness. Hemoptysis has resolved. Currently taking Augmentin.     Referring provider: Jilda Panda, MD  HPI: First referred in 2017 for evaluation of COPD. 2016 pulmonary function testing from [facility] showed FEV1 of 870 mL (37% predicted) consistent with severe airflow obstruction 2015 CT scan from [facility] showed severe panlobular emphysema She is prescribed 2 L O2 qHS and she sometimes uses a portable tank. She continues to smoke cigarettes now.  She quit smoking for one year in 2013 with Chantix.  She tried taking it again when she started back but it didn't help. She smokes 1/2 ppd as of 2017 but she thinks she smoked more.  She started smoking at age 31.    Recent [facility] Encounters:   06/15/2018-telephone encounter-[facility] Augmentin 875 twice daily for 5 days, CT chest without contrast ordered due to hemoptysis, 1 week follow-up  06/10/18-office visit-[facility] Patient seen in office today for hemoptysis of 2 days Plan stop smoking, prednisone 6 days, Z-Pak, consider sinus CT   03/17/18-office visit-[facility] COPD follow-up Plan: continue Serevent and Spiriva daily, use albuterol as needed, continue 2 L oxygen with exertion, emphasized quitting smoking offered referral to psychiatrist or therapist for her anxiety and depression, follow-up in 4 to 6 weeks   Tests:   Imaging:  06/23/2018-CT chest without contrast- advanced bullous type emphysema similar bilateral pulmonary nodularity presumed benign 01/13/2017-PET scan- scattered sclerotic lesions involving thoracolumbar spine and bilateral pelvis, stability of many of these lesions favors benign etiology   Chart Review:  06/08/2018-ER visit-cough  06/23/18 Acute  Pleasant 57 year old patient seen in office today.  Patient reporting hemoptysis.  Patient was  treated via telephone encounter with Augmentin as well as CT.  Patient completed CT today.  CT showing no acute changes.  Showing advanced disease of bullous emphysema.     Patient reporting that under current treatment regimen symptoms have improved.  Patient still taking Augmentin.  Patient reports that with Augmentin symptoms have improved, is now no longer having hemoptysis.  Patient still is having thick white mucus.  Patient has reported that she is having more headaches than usual.  Patient reports that she is been taking aspirin for her headaches.  Patient is still actively smoking.   No Known Allergies  Immunization History  Administered Date(s) Administered  . Influenza Split 09/27/2014, 12/06/2015  . Influenza,inj,Quad PF,6+ Mos 08/27/2016, 09/05/2017    Past Medical History:  Diagnosis Date  . Anemia   . Anxiety   . Arthritis    OSTEOARTHRITIS  . Blood dyscrasia    EASY BLEEDER  . COPD (chronic obstructive pulmonary disease) ([place])   . Depression   . Osteoporosis   . Seasonal allergies   . Sickle cell trait ([place])     Tobacco History: Social History   Tobacco Use  Smoking Status Current Some Day Smoker  . Packs/day: 0.50  . Years: 42.00  . Pack years: 21.00  . Types: Cigarettes  Smokeless Tobacco Never Used   Ready to quit: Yes Counseling given: Yes Discussed extensively with patient her need to stop smoking.  Patient admits that some days she is ready to quit another day she is not.  Patient is currently doing reduced to quit method to stop smoking.  Patient reports she is down to 7 to 8 cigarettes a  day.  This is down from her peak smoking of 0.5 packs/day.  I encouraged patient to continue on this path and try to be down to 3 to 4 cigarettes at our next office visit.  Emphasized with patient the importance to stop smoking.  As this will only make it harder for [place] to manage her respiratory status and probably increase her oxygen needs.  Smoking resources  provided: [address] [place] [address]  >>> Patient to call this resource and utilize it to help support her quit smoking >>> Keep up your hard work with stopping smoking  You can also contact the [facility] >>>For smoking cessation classes call [phone]  We do not recommend using e-cigarettes as a form of stopping smoking   Outpatient Encounter Medications as of 06/23/2018  Medication Sig  . albuterol (PROVENTIL HFA;VENTOLIN HFA) 108 (90 Base) MCG/ACT inhaler Inhale 2 puffs into the lungs every 6 (six) hours as needed for wheezing or shortness of breath.  Marland Kitchen albuterol (PROVENTIL) (2.5 MG/3ML) 0.083% nebulizer solution Take 3 mLs (2.5 mg total) by nebulization every 6 (six) hours as needed for wheezing or shortness of breath.  Marland Kitchen amoxicillin-clavulanate (AUGMENTIN) 875-125 MG tablet Take 1 tablet by mouth 2 (two) times daily.  Marland Kitchen buPROPion (WELLBUTRIN SR) 100 MG 12 hr tablet Take 100 mg by mouth 2 (two) times daily.    Marland Kitchen ibuprofen (ADVIL,MOTRIN) 600 MG tablet Take 1 tablet (600 mg total) by mouth every 6 (six) hours as needed.  . Multiple Vitamin (MULTIVITAMIN) capsule Take 1 capsule by mouth daily.  Salley Scarlet FORMUL[facility]  Onychomycosis Nail Lacquer -  Fluconazole 2%, Terbinafine 1% DMSO Apply to affected nail once daily Qty. 120 gm 3 refills  . OXYGEN Inhale 2 L into the lungs as needed (for shortness of breath).   . raloxifene (EVISTA) 60 MG tablet Take 60 mg by mouth daily.    . SEREVENT DISKUS 50 MCG/DOSE diskus inhaler INHALE 1 PUFF INTO LUNGS TWICE DAILY  . Tetrahydrozoline HCl (VISINE OP) Place 1 drop into both eyes as needed (for dry eyes).  . Tiotropium Bromide Monohydrate (SPIRIVA RESPIMAT) 1.25 MCG/ACT AERS Inhale 4 puffs into the lungs daily.  . [DISCONTINUED] azithromycin (ZITHROMAX) 250 MG tablet Take 1 tablet (250 mg total) by mouth daily. Take first 2 tablets together, then 1 every day until finished.  . [DISCONTINUED] predniSONE (DELTASONE) 10 MG tablet  Take  4 each am x 2 days,   2 each am x 2 days,  1 each am x 2 days and stop   No facility-administered encounter medications on file as of 06/23/2018.      Review of Systems  Review of Systems  Constitutional: Negative for chills, fatigue, fever and unexpected weight change.  HENT: Negative for congestion, postnasal drip, sinus pressure, sneezing and sore throat.   Respiratory: Positive for cough, shortness of breath and wheezing.   Cardiovascular: Negative for chest pain and palpitations.  Gastrointestinal: Negative for abdominal distention, blood in stool, constipation, diarrhea, nausea and vomiting.  Endocrine: Negative for polydipsia, polyphagia and polyuria.  Genitourinary: Negative for hematuria.  Musculoskeletal: Negative for arthralgias.  Allergic/Immunologic: Negative for environmental allergies, food allergies and immunocompromised state.  Neurological: Negative for dizziness, light-headedness and headaches.  Psychiatric/Behavioral: Negative for agitation and dysphoric mood. The patient is not nervous/anxious.      Physical Exam  BP 110/70   Pulse 87   Ht 5\' 4"  (1.626 m)   Wt 132 lb 3.2 oz (60 kg)  SpO2 91%   BMI 22.69 kg/m   Wt Readings from Last 5 Encounters:  06/23/18 132 lb 3.2 oz (60 kg)  06/10/18 129 lb 9.6 oz (58.8 kg)  03/17/18 133 lb 12.8 oz (60.7 kg)  01/08/18 138 lb (62.6 kg)  09/29/17 132 lb (59.9 kg)     Physical Exam  Constitutional: She is oriented to person, place, and time and well-developed, well-nourished, and in no distress. No distress.  HENT:  Head: Normocephalic and atraumatic.  Right Ear: External ear normal.  Nose: Nose normal. Right sinus exhibits no maxillary sinus tenderness and no frontal sinus tenderness. Left sinus exhibits no maxillary sinus tenderness and no frontal sinus tenderness.  Mouth/Throat: Uvula is midline, oropharynx is clear and moist and mucous membranes are normal. No oropharyngeal exudate.  Eyes: Pupils are  equal, round, and reactive to light. Conjunctivae are normal.  Neck: Normal range of motion. Neck supple.  Cardiovascular: Normal rate, regular rhythm and normal heart sounds.  Pulmonary/Chest: Effort normal. No accessory muscle usage. No respiratory distress. She has decreased breath sounds (Diminished breath sounds in all fields). She has no wheezes. She has no rhonchi.  Abdominal: Soft. Bowel sounds are normal.  Musculoskeletal: Normal range of motion. She exhibits no edema.  Lymphadenopathy:    She has no cervical adenopathy.  Neurological: She is alert and oriented to person, place, and time. Gait normal.  Skin: Skin is warm and dry. She is not diaphoretic. No erythema.  Psychiatric: Mood, memory, affect and judgment normal.  Nursing note and vitals reviewed.     Lab Results:  CBC    Component Value Date/Time   WBC 9.4 06/08/2018 2132   RBC 4.98 06/08/2018 2132   HGB 14.3 06/08/2018 2132   HCT 41.6 06/08/2018 2132   PLT 221 06/08/2018 2132   MCV 83.5 06/08/2018 2132   MCH 28.7 06/08/2018 2132   MCHC 34.4 06/08/2018 2132   RDW 14.5 06/08/2018 2132   LYMPHSABS 3.9 06/08/2018 2132   MONOABS 0.5 06/08/2018 2132   EOSABS 0.2 06/08/2018 2132   BASOSABS 0.1 06/08/2018 2132    BMET    Component Value Date/Time   NA 140 06/08/2018 2132   K 3.9 06/08/2018 2132   CL 109 06/08/2018 2132   CO2 24 06/08/2018 2132   GLUCOSE 120 (H) 06/08/2018 2132   BUN 20 06/08/2018 2132   CREATININE 1.03 (H) 06/08/2018 2132   CREATININE 0.75 10/28/2016 1101   CALCIUM 9.3 06/08/2018 2132   GFRNONAA 58 (L) 06/08/2018 2132   GFRAA >60 06/08/2018 2132    BNP    Component Value Date/Time   BNP 14.0 09/02/2017 1631    ProBNP No results found for: PROBNP  Imaging: Dg Chest 2 View  Result Date: 06/08/2018 CLINICAL DATA:  57 year old female with hemoptysis. EXAM: CHEST - 2 VIEW COMPARISON:  PET-CT dated 01/13/2017 FINDINGS: There is emphysematous changes of the lungs with areas of  atelectasis/scarring primarily involving the lower lobes. No focal consolidation, pleural effusion, or pneumothorax. The cardiac silhouette is within normal limits. Degenerative changes of the spine. No acute osseous pathology. IMPRESSION: No active cardiopulmonary disease. Emphysema. Electronically Signed   By: Anner Crete M.D.   On: 06/08/2018 22:12   Ct Chest Wo Contrast  Result Date: 06/23/2018 CLINICAL DATA:  Intermittent shortness of breath and hemoptysis. Chest soreness. History of COPD. EXAM: CT CHEST WITHOUT CONTRAST TECHNIQUE: Multidetector CT imaging of the chest was performed following the standard protocol without IV contrast. COMPARISON:  Plain film 06/08/2018. Most  recent diagnostic chest CT 11/06/2016. FINDINGS: Cardiovascular: Aortic and branch vessel atherosclerosis. Normal heart size, without pericardial effusion. Mediastinum/Nodes: No mediastinal or definite hilar adenopathy, given limitations of unenhanced CT. Lungs/Pleura: No pleural fluid. Minimal motion degradation inferiorly. Advanced bullous type emphysema. 12 mm pleural-based right lower lobe nodular density or area of volume loss is unchanged on 118/3, consistent with a benign etiology. More cephalad and lateral pleural-based right lower lobe nodules x2 are also similar to on the prior and can be presumed benign. Pleural and subpleural left lower lobe pulmonary nodules are also similar to on the prior exam and can be presumed benign. Example at 4 mm on 107/3. Similar appearance of right middle lobe and less so lingular volume loss with right middle lobe consolidation indicative of scarring. The previously measured area of right middle lobe nodularity is no longer identified. Upper Abdomen: Normal imaged portions of the liver, spleen, stomach, pancreas, adrenal glands, kidneys. Musculoskeletal: Posterolateral right fifth rib remote fracture. Scattered sclerotic osseous lesions have been extensively evaluated previously. Convex  right thoracic spine curvature. IMPRESSION: 1. Advanced bullous type emphysema. No other explanation for patient's symptoms. 2. Areas of bilateral pulmonary nodularity are similar to 2017 and can be presumed benign. 3. Similar appearance of right middle lobe volume loss and consolidation, favoring chronic scarring. Electronically Signed   By: Abigail Miyamoto M.D.   On: 06/23/2018 09:05     Assessment & Plan:   Pleasant 57 year old patient seen office today.  We will have patient follow-up in 6 weeks.    Discussed with patient extensively that she needs to wear her oxygen whenever having any exertion.  2 L with exertion is what we have ordered.  Patient admitted multiple times that this is not where she is followed.  And sometimes has headaches when she is out running errands or outside for too long without oxygen.  I fear that she is having hypoxic episodes during this time.  Patient presented to our office with no oxygen and had an SPO2 of 84.  Patient to resume using 2 L with exertion.  Encourage patient to purchase SPO2 monitor from pharmacy in order to help monitor O2 sats.  Patient knows goal O2 sats of 88% or greater.  Also emphasized with patient is important of her stopping smoking.  This will contribute to her COPD exacerbations, and continue to make it difficult for [place] to manage her respiratory status adequately.  Patient to proceed forward with reduce to quit method.  Goal is to be down to 3 to 4 cigarettes a day at next office visit.  Patient to stop taking aspirin for headaches.  Can use Tylenol.    Consider pneumonia vaccine at next visit.  Chronic respiratory failure with hypoxia (HCC) 2 L with exertion  COPD with emphysema ([place]) CT done today stable Continue oxygen therapy as prescribed Continue to work towards stopping smoking   Cigarette smoker Continue to work towards stopping smoking.     Lauraine Rinne, NP 06/23/2018

## 2018-06-23 ENCOUNTER — Encounter: Payer: Self-pay | Admitting: Pulmonary Disease

## 2018-06-23 ENCOUNTER — Ambulatory Visit (INDEPENDENT_AMBULATORY_CARE_PROVIDER_SITE_OTHER)
Admission: RE | Admit: 2018-06-23 | Discharge: 2018-06-23 | Disposition: A | Discharge status: Home / Self Care | Payer: Medicare Other | Source: Ambulatory Visit | Attending: Pulmonary Disease | Admitting: Pulmonary Disease

## 2018-06-23 ENCOUNTER — Ambulatory Visit (INDEPENDENT_AMBULATORY_CARE_PROVIDER_SITE_OTHER): Payer: Medicare Other | Admitting: Pulmonary Disease

## 2018-06-23 DIAGNOSIS — F17210 Nicotine dependence, cigarettes, uncomplicated: Secondary | ICD-10-CM | POA: Diagnosis not present

## 2018-06-23 DIAGNOSIS — J439 Emphysema, unspecified: Secondary | ICD-10-CM | POA: Diagnosis not present

## 2018-06-23 DIAGNOSIS — R042 Hemoptysis: Secondary | ICD-10-CM | POA: Diagnosis not present

## 2018-06-23 DIAGNOSIS — J9611 Chronic respiratory failure with hypoxia: Secondary | ICD-10-CM

## 2018-06-23 NOTE — Assessment & Plan Note (Signed)
CT done today stable Continue oxygen therapy as prescribed Continue to work towards stopping smoking

## 2018-06-23 NOTE — Progress Notes (Signed)
Reviewed, agree 

## 2018-06-23 NOTE — Assessment & Plan Note (Signed)
2 L with exertion

## 2018-06-23 NOTE — Patient Instructions (Addendum)
Continue respiratory medications at this time Do not take any more aspirin for headaches Can take Tylenol for management of headaches in the meantime  Continue oxygen therapy as prescribed >>> 2 L of oxygen with exertion >>> For example: Do this when running errands, at [place] on a grocery store, when you present to [place] Augmentin  Continue to work towards stopping smoking.  Continue reduced to quit method.  At 6 weeks let us try to be down to 3 to 4 cigarettes a day.  Smoking resources: [address] [place]  >>> Patient to call this resource and utilize it to help support her quit smoking >>> Keep up your hard work with stopping smoking  You can also contact the [facility] >>>For smoking cessation classes call [phone]  We do not recommend using e-cigarettes as a form of stopping smoking    Note your daily symptoms > remember "red flags" for COPD:   >>>Increase in cough >>>increase in sputum production >>>increase in shortness of breath or activity  intolerance.   If you notice these symptoms, please call the office to be seen.    Follow-up with Dr. Lake Bells in 6 weeks   Please contact the office if your symptoms worsen or you have concerns that you are not improving.   Thank you for choosing [facility] for your healthcare, and for allowing [place] to partner with you on your healthcare journey. I am thankful to be able to provide care to you today.   Wyn Quaker FNP-C

## 2018-06-23 NOTE — Assessment & Plan Note (Signed)
Continue to work towards stopping smoking.

## 2018-06-25 ENCOUNTER — Other Ambulatory Visit: Payer: Self-pay | Admitting: Acute Care

## 2018-06-25 DIAGNOSIS — Z122 Encounter for screening for malignant neoplasm of respiratory organs: Secondary | ICD-10-CM

## 2018-06-25 DIAGNOSIS — F17210 Nicotine dependence, cigarettes, uncomplicated: Principal | ICD-10-CM

## 2018-07-08 ENCOUNTER — Ambulatory Visit: Payer: Medicare Other | Admitting: Pulmonary Disease

## 2018-07-20 ENCOUNTER — Ambulatory Visit
Admission: RE | Admit: 2018-07-20 | Discharge: 2018-07-20 | Disposition: A | Discharge status: Home / Self Care | Payer: Medicare Other | Source: Ambulatory Visit | Attending: Internal Medicine | Admitting: Internal Medicine

## 2018-07-20 DIAGNOSIS — M8580 Other specified disorders of bone density and structure, unspecified site: Secondary | ICD-10-CM

## 2018-07-22 ENCOUNTER — Ambulatory Visit: Payer: Medicare Other | Admitting: Pulmonary Disease

## 2018-07-22 ENCOUNTER — Ambulatory Visit
Admission: RE | Admit: 2018-07-22 | Discharge: 2018-07-22 | Disposition: A | Discharge status: Home / Self Care | Payer: Medicare Other | Source: Ambulatory Visit | Attending: Internal Medicine | Admitting: Internal Medicine

## 2018-07-22 NOTE — Progress Notes (Deleted)
Subjective:    Patient ID: [name] [name], female    DOB: 1960-12-21, 57 y.o.   MRN: [id number]  Synopsis: First referred in 2017 for evaluation of COPD. 2016 pulmonary function testing from [facility] showed FEV1 of 870 mL (37% predicted) consistent with severe airflow obstruction 2015 CT scan from [facility] showed severe panlobular emphysema She is prescribed 2 L O2 qHS and she sometimes uses a portable tank. She continues to smoke cigarettes now.  She quit smoking for one year in 2013 with Chantix.  She tried taking it again when she started back but it didn't help. She smokes 1/2 ppd as of 2017 but she thinks she smoked more.  She started smoking at age 66.    HPI No chief complaint on file.  ***   Past Medical History:  Diagnosis Date  . Anemia   . Anxiety   . Arthritis    OSTEOARTHRITIS  . Blood dyscrasia    EASY BLEEDER  . COPD (chronic obstructive pulmonary disease) ([place])   . Depression   . Osteoporosis   . Seasonal allergies   . Sickle cell trait ([place])       Review of Systems  Constitutional: Negative for chills, fatigue and fever.  HENT: Negative for rhinorrhea, sinus pressure and sneezing.   Respiratory: Positive for shortness of breath. Negative for cough, wheezing and stridor.   Cardiovascular: Negative for chest pain, palpitations and leg swelling.       Objective:   Physical Exam There were no vitals filed for this visit. RA  ***   December 2017 records from her visit here reviewed were a PET scan was arranged to follow-up an abnormal CT finding from her chest.  January 2018 PET CT showed scattered sclerotic lesions involving the thoracic or lumbar spine and bilateral pelvis non-FDG avid felt to be benign. Images independently reviewed also showing significant centrilobular emphysema.  August 2018 chest x-ray showed severe bullous emphysema some atelectasis in the bases but no other changes.     Assessment & Plan:   No diagnosis  found.  Discussion: ***   Current Outpatient Medications:  .  albuterol (PROVENTIL HFA;VENTOLIN HFA) 108 (90 Base) MCG/ACT inhaler, Inhale 2 puffs into the lungs every 6 (six) hours as needed for wheezing or shortness of breath., Disp: 1 Inhaler, Rfl: 3 .  albuterol (PROVENTIL) (2.5 MG/3ML) 0.083% nebulizer solution, Take 3 mLs (2.5 mg total) by nebulization every 6 (six) hours as needed for wheezing or shortness of breath., Disp: 360 mL, Rfl: 12 .  amoxicillin-clavulanate (AUGMENTIN) 875-125 MG tablet, Take 1 tablet by mouth 2 (two) times daily., Disp: 10 tablet, Rfl: 0 .  buPROPion (WELLBUTRIN SR) 100 MG 12 hr tablet, Take 100 mg by mouth 2 (two) times daily.  , Disp: , Rfl:  .  ibuprofen (ADVIL,MOTRIN) 600 MG tablet, Take 1 tablet (600 mg total) by mouth every 6 (six) hours as needed., Disp: 15 tablet, Rfl: 0 .  Multiple Vitamin (MULTIVITAMIN) capsule, Take 1 capsule by mouth daily., Disp: , Rfl:  .  NON FORMULARY, [facility]  Onychomycosis Nail Lacquer -  Fluconazole 2%, Terbinafine 1% DMSO Apply to affected nail once daily Qty. 120 gm 3 refills, Disp: , Rfl:  .  OXYGEN, Inhale 2 L into the lungs as needed (for shortness of breath). , Disp: , Rfl:  .  raloxifene (EVISTA) 60 MG tablet, Take 60 mg by mouth daily.  , Disp: , Rfl:  .  SEREVENT DISKUS 50 MCG/DOSE diskus inhaler,  INHALE 1 PUFF INTO LUNGS TWICE DAILY, Disp: 60 g, Rfl: 11 .  Tetrahydrozoline HCl (VISINE OP), Place 1 drop into both eyes as needed (for dry eyes)., Disp: , Rfl:  .  Tiotropium Bromide Monohydrate (SPIRIVA RESPIMAT) 1.25 MCG/ACT AERS, Inhale 4 puffs into the lungs daily., Disp: , Rfl:

## 2018-07-23 ENCOUNTER — Other Ambulatory Visit: Payer: Medicare Other

## 2018-07-30 ENCOUNTER — Encounter: Payer: Self-pay | Admitting: Internal Medicine

## 2018-07-30 ENCOUNTER — Telehealth: Payer: Self-pay | Admitting: *Deleted

## 2018-07-30 NOTE — Telephone Encounter (Signed)
See earlier note from Me

## 2018-07-30 NOTE — Telephone Encounter (Signed)
I am not sure how appropriate she is for surveillance colonoscopy given her pulmonary status. She should see ME in the office to discuss. Thank you

## 2018-07-30 NOTE — Telephone Encounter (Signed)
Dr. Henrene Pastor,  This patient is scheduled for a colonoscopy 08/19/18 at [facility]. She currently uses O2 and does not qualify to be done at [facility]. I reviewed her pulmonary notes. Is she okay for direct to the hospital or do you feel she needs an OV? If OV is needed, okay to see an extender or do you prefer for her to see you?  Thank you, Margaretmary Eddy

## 2018-08-19 ENCOUNTER — Encounter: Payer: Medicare Other | Admitting: Internal Medicine

## 2018-09-08 ENCOUNTER — Other Ambulatory Visit: Payer: Self-pay | Admitting: Pulmonary Disease

## 2018-09-08 MED ORDER — TIOTROPIUM BROMIDE MONOHYDRATE 1.25 MCG/ACT IN AERS: 2.0000 | INHALATION_SPRAY | Freq: Every day | RESPIRATORY_TRACT | 2 refills | Status: DC

## 2018-09-22 ENCOUNTER — Ambulatory Visit (INDEPENDENT_AMBULATORY_CARE_PROVIDER_SITE_OTHER): Payer: Medicare Other | Admitting: Internal Medicine

## 2018-09-22 ENCOUNTER — Encounter (INDEPENDENT_AMBULATORY_CARE_PROVIDER_SITE_OTHER): Payer: Self-pay

## 2018-09-22 ENCOUNTER — Encounter: Payer: Self-pay | Admitting: Internal Medicine

## 2018-09-22 VITALS — BP 100/76 | HR 80 | Ht 63.0 in | Wt 132.4 lb

## 2018-09-22 DIAGNOSIS — J449 Chronic obstructive pulmonary disease, unspecified: Secondary | ICD-10-CM

## 2018-09-22 DIAGNOSIS — Z86010 Personal history of colonic polyps: Secondary | ICD-10-CM | POA: Diagnosis not present

## 2018-09-22 NOTE — Patient Instructions (Signed)
You will be due for a colonoscopy in November of 2022.  You will receive a letter reminding you to call and schedule it

## 2018-09-22 NOTE — Progress Notes (Signed)
HISTORY OF PRESENT ILLNESS:  [name] [name] is a very pleasant 57 y.o. female with advanced oxygen requiring COPD who presents today regarding possible surveillance colonoscopy.  The patient initially underwent complete colonoscopy July 2007 after reporting a family history of colon cancer.  Examination was negative.  She underwent a follow-up examination November 2012.  Examination revealed severe diverticulosis and a diminutive colon polyp which was removed and found to be adenomatous.  No other abnormalities.  Follow-up in 5 years recommended.  Patient did receive a recall letter but presents at this time.  Her lung disease has progressed since her last colonoscopy 7 years ago.  She tells me that she uses oxygen during the day and at night to varying degrees.  She continues to smoke.  Her GI review of systems is remarkable for bloating.  No change in bowel habits, abdominal pain, or bleeding.  Review of outside laboratories from June 2019 finds a normal CBC and comprehensive metabolic panel.  Hemoglobin 15.2.  Review of outside x-rays includes a CT scan of the chest, abdomen and pelvis to evaluate shortness of breath and weight loss.  There were no acute findings of the chest, abdomen, or pelvis.  She was noted to have advanced emphysema.  Patient tells me today that her father did not have colon cancer, but rather gastric cancer.  REVIEW OF SYSTEMS:  All non-GI ROS negative as otherwise stated in the HPI except for shortness of breath, night sweats, urinary frequency  Past Medical History:  Diagnosis Date  . Anemia   . Anxiety   . Arthritis    OSTEOARTHRITIS  . Blood dyscrasia    EASY BLEEDER  . Colon polyps   . COPD (chronic obstructive pulmonary disease) ([place])   . Depression   . Osteoporosis   . Seasonal allergies   . Sickle cell trait ([place])   . Uterine fibroid     Past Surgical History:  Procedure Laterality Date  . CESAREAN SECTION  1980  . DILATATION & CURETTAGE/HYSTEROSCOPY WITH  MYOSURE N/A 09/30/2017   Procedure: DILATATION & CURETTAGE/HYSTEROSCOPY WITH MYOSURE MYOMECTOMY;  Surgeon: Thurnell Lose, MD;  Location: [facility];  Service: Gynecology;  Laterality: N/A;  PMB    Social History LEMPI EDWIN  reports that she has been smoking cigarettes. She has a 21.00 pack-year smoking history. She has never used smokeless tobacco. She reports that she drinks alcohol. She reports that she does not use drugs.  family history includes Heart disease in her mother; Stomach cancer (age of onset: 85) in her father; Throat cancer in her brother.  No Known Allergies     PHYSICAL EXAMINATION: Vital signs: BP 100/76 (BP Location: Left Arm, Patient Position: Sitting, Cuff Size: Normal)   Pulse 80   Ht 5\' 3"  (1.6 m) Comment: height measured without shoes  Wt 132 lb 6 oz (60 kg)   BMI 23.45 kg/m   Constitutional: Pleasant, generally well-appearing, no acute distress Psychiatric: alert and oriented x3, cooperative Eyes: extraocular movements intact, anicteric, conjunctiva pink Mouth: oral pharynx moist, no lesions Neck: supple no lymphadenopathy Cardiovascular: heart regular rate and rhythm, no murmur Lungs: clear to auscultation bilaterally but distant breath sounds Abdomen: soft, nontender, nondistended, no obvious ascites, no peritoneal signs, normal bowel sounds, no organomegaly Rectal: Omitted Extremities: no open, cyanosis, or lower extremity edema bilaterally Skin: no lesions on visible extremities Neuro: No focal deficits.  Cranial nerves intact  ASSESSMENT:  1.  Patient with prior history of normal colonoscopy 2007 and single diminutive  adenoma 2012.  Family history corrected to NO colon cancer in first-degree relative 2.  Advanced oxygen requiring COPD   PLAN:  1.  I discussed with the patient that she is at low risk for colorectal neoplasia given the above findings on her previous high-quality colonoscopies.  She is however at high risk for procedure related  complications due to her advanced lung disease.  As such, I would not offer routine surveillance colonoscopy at this time.  She could be considered for reevaluation down the road should she develop relevant signs or symptoms that would justify the procedure or if her clinical status were to improve to an acceptable risk level.  She understands and agrees.

## 2018-10-14 ENCOUNTER — Other Ambulatory Visit: Payer: Self-pay | Admitting: Internal Medicine

## 2018-10-14 DIAGNOSIS — Z1231 Encounter for screening mammogram for malignant neoplasm of breast: Secondary | ICD-10-CM

## 2018-11-03 ENCOUNTER — Ambulatory Visit
Admission: RE | Admit: 2018-11-03 | Discharge: 2018-11-03 | Disposition: A | Discharge status: Home / Self Care | Payer: Medicare Other | Source: Ambulatory Visit | Attending: Internal Medicine | Admitting: Internal Medicine

## 2018-11-03 DIAGNOSIS — Z1231 Encounter for screening mammogram for malignant neoplasm of breast: Secondary | ICD-10-CM

## 2019-01-04 ENCOUNTER — Telehealth: Payer: Self-pay | Admitting: Pulmonary Disease

## 2019-01-04 MED ORDER — TIOTROPIUM BROMIDE MONOHYDRATE 1.25 MCG/ACT IN AERS: 2.0000 | INHALATION_SPRAY | Freq: Every day | RESPIRATORY_TRACT | 0 refills | Status: DC

## 2019-01-04 NOTE — Telephone Encounter (Signed)
Refilled x 1 only  Spoke with the pt and made her aware and urged her to keep rov for additional rf She verbalized understanding and nothing further needed

## 2019-01-04 NOTE — Telephone Encounter (Signed)
Pt returning call. Pt is sched with BQ 2/4 at 10:15. Cb is [phone].

## 2019-01-04 NOTE — Telephone Encounter (Signed)
OK but needs to schedule rov with BQ  LMTCB

## 2019-01-19 ENCOUNTER — Encounter: Payer: Self-pay | Admitting: Pulmonary Disease

## 2019-01-19 ENCOUNTER — Ambulatory Visit (INDEPENDENT_AMBULATORY_CARE_PROVIDER_SITE_OTHER): Payer: Medicare Other | Admitting: Pulmonary Disease

## 2019-01-19 VITALS — BP 118/64 | HR 87 | Ht 63.0 in | Wt 131.0 lb

## 2019-01-19 DIAGNOSIS — F17210 Nicotine dependence, cigarettes, uncomplicated: Secondary | ICD-10-CM

## 2019-01-19 DIAGNOSIS — Z862 Personal history of diseases of the blood and blood-forming organs and certain disorders involving the immune mechanism: Secondary | ICD-10-CM | POA: Diagnosis not present

## 2019-01-19 DIAGNOSIS — J439 Emphysema, unspecified: Secondary | ICD-10-CM | POA: Diagnosis not present

## 2019-01-19 DIAGNOSIS — J9611 Chronic respiratory failure with hypoxia: Secondary | ICD-10-CM

## 2019-01-19 NOTE — Addendum Note (Signed)
Addended by: Len Blalock on: 01/19/2019 11:16 AM   Modules accepted: Orders

## 2019-01-19 NOTE — Addendum Note (Signed)
Addended by: Suzzanne Cloud E on: 01/19/2019 11:25 AM   Modules accepted: Orders

## 2019-01-19 NOTE — Addendum Note (Signed)
Addended by: Suzzanne Cloud E on: 01/19/2019 11:18 AM   Modules accepted: Orders

## 2019-01-19 NOTE — Addendum Note (Signed)
Addended by: Suzzanne Cloud E on: 01/19/2019 10:52 AM   Modules accepted: Orders

## 2019-01-19 NOTE — Addendum Note (Signed)
Addended by: Suzzanne Cloud E on: 01/19/2019 11:23 AM   Modules accepted: Orders

## 2019-01-19 NOTE — Addendum Note (Signed)
Addended by: Len Blalock on: 01/19/2019 11:03 AM   Modules accepted: Orders

## 2019-01-19 NOTE — Patient Instructions (Signed)
Severe emphysema: Stop smoking Continue taking Spiriva daily, continue Serevent Use albuterol as needed for chest tightness wheezing or shortness of breath Stay active  Chronic respiratory failure with hypoxemia: Use 2 L of oxygen with exertion  Abdominal pain/cramping: I like to check a hemoglobin electrophoresis to see if you do in fact have sickle cell trait as you were told as a child.  This can lead to blood vessel problems in the abdomen which cause the pain like you describe  Cigarette smoking: It is important to quit smoking soon as possible  We will see you back in 6 months or sooner if needed

## 2019-01-19 NOTE — Progress Notes (Signed)
Subjective:    Patient ID: [name] [name], female    DOB: [birth year], 58 y.o.   MRN: [id number]  Synopsis: First referred in 2017 for evaluation of COPD. 2016 pulmonary function testing from [facility] showed FEV1 of 870 mL (37% predicted) consistent with severe airflow obstruction 2015 CT scan from [facility] showed severe panlobular emphysema She is prescribed 2 L O2 qHS and she sometimes uses a portable tank. She continues to smoke cigarettes now.  She quit smoking for one year in 2013 with Chantix.  She tried taking it again when she started back but it didn't help. She smokes 1/2 ppd as of 2017 but she thinks she smoked more.  She started smoking at age 69.    HPI Chief Complaint  Patient presents with  . Follow-up    pt states she is doing well, has a baseline cough.     Marline has been stable since we saw her last year.  She has not had an exacerbation of her COPD.  She says that she has been complaining of pain in her belly off and on for the last several months.  She describes a subcostal pain bilaterally.  She says that she does not have the pain when she coughs.  She does not cough very often.  Unfortunately she continues to smoke cigarettes.  She says that she was recently told that her hemoglobin value is elevated.  She was told that she had sickle cell trait as a child.   Past Medical History:  Diagnosis Date  . Anemia   . Anxiety   . Arthritis    OSTEOARTHRITIS  . Blood dyscrasia    EASY BLEEDER  . Colon polyps   . COPD (chronic obstructive pulmonary disease) ([place])   . Depression   . Osteoporosis   . Seasonal allergies   . Sickle cell trait ([place])   . Uterine fibroid       Review of Systems  Constitutional: Negative for chills, fatigue and fever.  HENT: Negative for rhinorrhea, sinus pressure and sneezing.   Respiratory: Positive for shortness of breath. Negative for cough, wheezing and stridor.   Cardiovascular: Negative for chest pain, palpitations and leg swelling.        Objective:   Physical Exam Vitals:   01/19/19 1002  BP: 118/64  Pulse: 87  SpO2: 93%  Weight: 131 lb (59.4 kg)  Height: 5\' 3"  (1.6 m)   RA  Gen: well appearing HENT: OP clear, TM's clear, neck supple PULM: Poor air movement B, normal percussion CV: RRR, no mgr, trace edema GI: BS+, soft, nontender Derm: no cyanosis or rash Psyche: normal mood and affect   December 2017 records from her visit here reviewed were a PET scan was arranged to follow-up an abnormal CT finding from her chest.  January 2018 PET CT showed scattered sclerotic lesions involving the thoracic or lumbar spine and bilateral pelvis non-FDG avid felt to be benign. Images independently reviewed also showing significant centrilobular emphysema.  August 2018 chest x-ray showed severe bullous emphysema some atelectasis in the bases but no other changes.     Assessment & Plan:   H/O sickle cell trait - Plan: Hemoglobinopathy evaluation  Moderate smoker (20 or less per day)  Chronic respiratory failure with hypoxia (HCC)  Pulmonary emphysema, unspecified emphysema type ([place])  Discussion: Though she has severe emphysema she has not had an exacerbation since the last visit.  Unfortunately she continues to smoke cigarettes we talked about the importance of  quitting today.  She has described this cramping abdominal pain which is been worse recently.  She was also found to have polycythemia by her primary care doctor.  In addition to all this she says that she carries a history of sickle cell trait.  Interestingly, sometimes with severe, chronic emphysema and chronic hypoxemia sickle trait can cause abdominal pain via microinfarcts.  I like to confirm whether or not she has that diagnosis by checking a hemoglobin electrophoresis test today.  Plan: Severe emphysema: Stop smoking Continue taking Spiriva daily, continue Serevent Use albuterol as needed for chest tightness wheezing or shortness of breath Stay  active  Chronic respiratory failure with hypoxemia: Use 2 L of oxygen with exertion  Abdominal pain/cramping: I like to check a hemoglobin electrophoresis to see if you do in fact have sickle cell trait as you were told as a child.  This can lead to blood vessel problems in the abdomen which cause the pain like you describe  Cigarette smoking: It is important to quit smoking soon as possible  We will see you back in 6 months or sooner if needed  > 50% of this 25 min visit spent face to face   Current Outpatient Medications:  .  albuterol (PROVENTIL HFA;VENTOLIN HFA) 108 (90 Base) MCG/ACT inhaler, Inhale 2 puffs into the lungs every 6 (six) hours as needed for wheezing or shortness of breath., Disp: 1 Inhaler, Rfl: 3 .  albuterol (PROVENTIL) (2.5 MG/3ML) 0.083% nebulizer solution, Take 3 mLs (2.5 mg total) by nebulization every 6 (six) hours as needed for wheezing or shortness of breath., Disp: 360 mL, Rfl: 12 .  buPROPion (WELLBUTRIN SR) 100 MG 12 hr tablet, Take 100 mg by mouth 2 (two) times daily.  , Disp: , Rfl:  .  Multiple Vitamin (MULTIVITAMIN) capsule, Take 1 capsule by mouth daily., Disp: , Rfl:  .  OXYGEN, Inhale 2 L into the lungs as needed (for shortness of breath). , Disp: , Rfl:  .  raloxifene (EVISTA) 60 MG tablet, Take 60 mg by mouth daily.  , Disp: , Rfl:  .  SEREVENT DISKUS 50 MCG/DOSE diskus inhaler, INHALE 1 PUFF INTO LUNGS TWICE DAILY, Disp: 60 g, Rfl: 11 .  Tetrahydrozoline HCl (VISINE OP), Place 1 drop into both eyes as needed (for dry eyes)., Disp: , Rfl:  .  Tiotropium Bromide Monohydrate (SPIRIVA RESPIMAT) 1.25 MCG/ACT AERS, Inhale 4 puffs into the lungs daily., Disp: , Rfl:

## 2019-01-19 NOTE — Addendum Note (Signed)
Addended by: Suzzanne Cloud E on: 01/19/2019 11:09 AM   Modules accepted: Orders

## 2019-01-19 NOTE — Addendum Note (Signed)
Addended by: Clayborne Dana C on: 01/19/2019 11:26 AM   Modules accepted: Orders

## 2019-01-21 LAB — HEMOGLOBINOPATHY EVALUATION
HCT: 44.8 % (ref 35.0–45.0)
HGB A: 57.6 % — AB (ref >96.0)
HGB S QUANTITAION: 37.2 % — AB
Hemoglobin A2 - HGBRFX: 4.2 % — ABNORMAL HIGH (ref 1.8–3.5)
Hemoglobin: 14.7 g/dL (ref 11.7–15.5)
MCH: 28.8 pg (ref 27.0–33.0)
MCV: 87.8 fL (ref 80.0–100.0)
RBC: 5.1 10*6/uL (ref 3.80–5.10)
RDW: 14.5 % (ref 11.0–15.0)

## 2019-01-26 ENCOUNTER — Telehealth: Payer: Self-pay | Admitting: Pulmonary Disease

## 2019-01-26 NOTE — Telephone Encounter (Signed)
Called and spoke with patient, she is requesting results to be given to her from lab work done on 01/19/19. Advised patient once these were resulted we can call her back. Patient is aware that BQ is out of the office until 02/01/19. Will route this over to TP. Please advise, thank you.

## 2019-01-26 NOTE — Telephone Encounter (Signed)
Will call her tomorrow with results

## 2019-01-27 NOTE — Telephone Encounter (Signed)
Faythe Ghee will talk to Dr. Lake Bells to see why he ordered and if he wants me to refer her .  He will be back in office next week .

## 2019-01-27 NOTE — Telephone Encounter (Signed)
Labs do show sickle cell trait .  Is she following with anyone for this ?

## 2019-01-27 NOTE — Telephone Encounter (Signed)
Called the patient she is aware of the sickle cell trait but is not being followed for this at this time since she was a teenager.

## 2019-02-01 NOTE — Telephone Encounter (Signed)
She mentioned this in passing last visit when she noted some unexplained abdominal pain.  Sometime sickle trait can cause splenic pain in patients with low O2.  We can discuss more on next visit.

## 2019-02-01 NOTE — Telephone Encounter (Signed)
ATC patient. She did not answer and VM did not pickup. Will attempt to call back later.

## 2019-02-02 NOTE — Telephone Encounter (Signed)
ATC pt, there was no answer and I could not leave a message. Will try back. 

## 2019-02-03 NOTE — Telephone Encounter (Signed)
ATC, NA and no option to leave msg 

## 2019-02-04 NOTE — Telephone Encounter (Signed)
Called and left message to call back when available.

## 2019-02-05 ENCOUNTER — Telehealth: Payer: Self-pay | Admitting: Pulmonary Disease

## 2019-02-05 NOTE — Telephone Encounter (Signed)
We have attempted to contact pt several times with no success or call back from pt. Per triage protocol, message will be closed.  

## 2019-02-05 NOTE — Telephone Encounter (Signed)
Advised pt of results. Pt understood and nothing further is needed.    Notes recorded by Juanito Doom, MD on 02/01/2019 at 10:47 AM EST A, Please let the patient know this confirmed her diagnosis of sickle trait. Will discuss more on next offie visit.  Thanks, B

## 2019-02-07 ENCOUNTER — Other Ambulatory Visit: Payer: Self-pay | Admitting: Pulmonary Disease

## 2019-03-01 ENCOUNTER — Telehealth: Payer: Self-pay | Admitting: Pulmonary Disease

## 2019-03-01 MED ORDER — ALBUTEROL SULFATE HFA 108 (90 BASE) MCG/ACT IN AERS: 2.0000 | INHALATION_SPRAY | Freq: Four times a day (QID) | RESPIRATORY_TRACT | 3 refills | Status: DC | PRN

## 2019-03-01 MED ORDER — DOXYCYCLINE HYCLATE 100 MG PO TABS: 100.0000 mg | ORAL_TABLET | Freq: Two times a day (BID) | ORAL | 0 refills | Status: DC

## 2019-03-01 NOTE — Telephone Encounter (Signed)
Called and spoke with pt letting her know that her rescue inhaler was refilled and that we were going to send in doxy abx to pharmacy in case if she needed it. Pt expressed understanding. meds sent to pt's pharmacy. Nothing further needed.

## 2019-03-01 NOTE — Telephone Encounter (Signed)
How long have symptoms been going on ?  Would make sure she is using her serevent and spiriva .   Use mucinex dm Twice daily  As needed   Zyrtec 10mg  At bedtime  As needed  Drainage  If she can check oxygen to make sure she is not dropping oxgyen< 88-90%.  Stop smoking .  If not improving will need to call back  Doxycycline 100mg  1 Twice daily  #14 to have on hold if symptoms worsen with discolored mucus . No refills   Please contact office for sooner follow up if symptoms do not improve or worsen or seek emergency care      If she would like to be tested please give her the numbers below to do E visit to see if she can be tested. . Make sure she has not have contact with anyone that has traveled not only internationally but also to the listed states and or been in crowds lately.   Please share with pt.    Coronavirus (COVID-19) Are you at risk?  Are you at risk for the Coronavirus (COVID-19)?  To be considered HIGH RISK for Coronavirus (COVID-19), you have to meet the following criteria:  . Traveled to [place], [place], [place], [place] or [place]; or in the [place] to [place], [place], [name], or [place]; and have fever, cough, and shortness of breath within the last 2 weeks of travel OR . Been in close contact with a person diagnosed with COVID-19 within the last 2 weeks and have fever, cough, and shortness of breath . IF YOU DO NOT MEET THESE CRITERIA, YOU ARE CONSIDERED LOW RISK FOR COVID-19.  What to do if you are HIGH RISK for COVID-19?  Marland Kitchen If you are having a medical emergency, call 911. . Seek medical care right away. Before you go to a doctor's office, urgent care or emergency department, call ahead and tell them about your recent travel, contact with someone diagnosed with COVID-19, and your symptoms. You should receive instructions from your physician's office regarding next steps of care.  . When you arrive at healthcare provider, tell the healthcare staff  immediately you have returned from visiting [place], [place], [place], [place] or [place]; or traveled in the [place] to [place], [place], [place], or [place]; in the last two weeks or you have been in close contact with a person diagnosed with COVID-19 in the last 2 weeks.   . Tell the health care staff about your symptoms: fever, cough and shortness of breath. . After you have been seen by a medical provider, you will be either: o Tested for (COVID-19) and discharged home on quarantine except to seek medical care if symptoms worsen, and asked to  - Stay home and avoid contact with others until you get your results (4-5 days)  - Avoid travel on public transportation if possible (such as bus, train, or airplane) or o Sent to the Emergency Department by EMS for evaluation, COVID-19 testing, and possible admission depending on your condition and test results.  What to do if you are LOW RISK for COVID-19?  Reduce your risk of any infection by using the same precautions used for avoiding the common cold or flu:  Marland Kitchen Wash your hands often with soap and warm water for at least 20 seconds.  If soap and water are not readily available, use an alcohol-based hand sanitizer with at least 60% alcohol.  . If coughing or sneezing, cover  your mouth and nose by coughing or sneezing into the elbow areas of your shirt or coat, into a tissue or into your sleeve (not your hands). . Avoid shaking hands with others and consider head nods or verbal greetings only. . Avoid touching your eyes, nose, or mouth with unwashed hands.  . Avoid close contact with people who are sick. . Avoid places or events with large numbers of people in one location, like concerts or sporting events. . Carefully consider travel plans you have or are making. . If you are planning any travel outside or inside the [place], visit the CDC's Travelers' Health webpage for the latest health notices. . If you have some symptoms but not all  symptoms, continue to monitor at home and seek medical attention if your symptoms worsen. . If you are having a medical emergency, call 911.   [place] / e-Visit: [email or web address]         [facility]: [facility]: [phone]                   [facility]: [phone]

## 2019-03-01 NOTE — Telephone Encounter (Signed)
Called and spoke with pt who stated symptoms have been going on x2 weeks.   Pt states that she has been using both the spiriva and serevent but states she is out of her albuterol inhaler. Stated to pt that we could send in a refill of the albuterol inhaler to her pharmacy for her so she could have as needed. Pt expressed understanding. I have refilled pt's rescue inhaler for her.   Stated to pt if no better to call office back and we could send in abx for her and also stated to her if she was not improving to call to schedule sooner appt or seek emergency care. I did give pt the phone number for [facility] at her request if she wanted to have the E Visit done for  coronavirus.   Sending back to TP as an FYI with how long pt's symptoms have been going on.

## 2019-03-01 NOTE — Telephone Encounter (Signed)
Primary Pulmonologist: BQ Last office visit and with whom: 01/19/2019 BQ What do we see them for (pulmonary problems): COPD exacerbation, H/O Sickle Cell Trait Last OV assessment/plan:  Juanito Doom, MD (Physician)    Severe emphysema: Stop smoking Continue taking Spiriva daily, continue Serevent Use albuterol as needed for chest tightness wheezing or shortness of breath Stay active  Chronic respiratory failure with hypoxemia: Use 2 L of oxygen with exertion  Abdominal pain/cramping: I like to check a hemoglobin electrophoresis to see if you do in fact have sickle cell trait as you were told as a child.  This can lead to blood vessel problems in the abdomen which cause the pain like you describe  Cigarette smoking: It is important to quit smoking soon as possible  We will see you back in 6 months or sooner if needed     Reason for call: Patient would like a call back today regarding the COVID-19 virus. Pt has SOB with exertion, productive cough-white/brown, and some wheezing. Pt is using her nebulizer, oxygen and inhalers daily. Pt has not traveled in last month, not around anyone sick in last month, no fever, no chills, no chest tightness, no body aches or headaches. Advised pt that someone will call her back this morning.

## 2019-03-01 NOTE — Telephone Encounter (Signed)
Thank you .  Can send Doxycycline for her to have on hold as previous message detailed.   Please contact office for sooner follow up if symptoms do not improve or worsen or seek emergency care

## 2019-04-16 ENCOUNTER — Other Ambulatory Visit: Payer: Self-pay | Admitting: Pulmonary Disease

## 2019-05-11 ENCOUNTER — Ambulatory Visit: Payer: Medicare Other | Admitting: Primary Care

## 2019-05-18 ENCOUNTER — Other Ambulatory Visit: Payer: Self-pay

## 2019-05-18 ENCOUNTER — Ambulatory Visit (INDEPENDENT_AMBULATORY_CARE_PROVIDER_SITE_OTHER): Payer: Medicare Other | Admitting: Primary Care

## 2019-05-18 ENCOUNTER — Encounter: Payer: Self-pay | Admitting: Primary Care

## 2019-05-18 VITALS — BP 110/70 | HR 75 | Temp 98.1°F | Ht 63.0 in | Wt 128.6 lb

## 2019-05-18 DIAGNOSIS — R0602 Shortness of breath: Secondary | ICD-10-CM

## 2019-05-18 DIAGNOSIS — J449 Chronic obstructive pulmonary disease, unspecified: Secondary | ICD-10-CM | POA: Diagnosis not present

## 2019-05-18 MED ORDER — TIOTROPIUM BROMIDE MONOHYDRATE 1.25 MCG/ACT IN AERS: 2.0000 | INHALATION_SPRAY | Freq: Every day | RESPIRATORY_TRACT | 0 refills | Status: DC

## 2019-05-18 NOTE — Progress Notes (Signed)
@Patient  ID: [name] [name], female    DOB: February 03, 1961, 58 y.o.   MRN: [id number]  Chief Complaint  Patient presents with  . Follow-up    breathing better    Referring provider: Jilda Panda, MD  HPI: 59 year old female, current smoker. PMH significant for COPD, bullous emphysema, chronic respiratory failure with hypoxia. Patient of Dr. Lake Bells, last seen 01/19/19. Maintained on Spiriva and Servent.   05/18/2019 Patient presents today for follow-up. She is doing well, states that her breathing has not been an issue. She continues taking Spiriva daily. She has only been taking Serevent once daily. She does not experience shortness of breath every day. States that her symptoms vary. Only uses her rescue inhaler when she has to go up stairs which is not every day. States that she usually uses the elevator. She continues smoking about 10 cigarettes a day, stress triggers her need to smoke. She is interested in cutting back. Wears oxygen at night. Denies cough or wheezing.   No Known Allergies  Immunization History  Administered Date(s) Administered  . Influenza Split 09/27/2014, 12/06/2015, 11/18/2018  . Influenza,inj,Quad PF,6+ Mos 08/27/2016, 09/05/2017    Past Medical History:  Diagnosis Date  . Anemia   . Anxiety   . Arthritis    OSTEOARTHRITIS  . Blood dyscrasia    EASY BLEEDER  . Colon polyps   . COPD (chronic obstructive pulmonary disease) ([place])   . Depression   . Osteoporosis   . Seasonal allergies   . Sickle cell trait ([place])   . Uterine fibroid     Tobacco History: Social History   Tobacco Use  Smoking Status Current Some Day Smoker  . Packs/day: 0.50  . Years: 42.00  . Pack years: 21.00  . Types: Cigarettes  Smokeless Tobacco Never Used   Ready to quit: Not Answered Counseling given: Not Answered   Outpatient Medications Prior to Visit  Medication Sig Dispense Refill  . albuterol (PROVENTIL HFA;VENTOLIN HFA) 108 (90 Base) MCG/ACT inhaler Inhale 2 puffs  into the lungs every 6 (six) hours as needed for wheezing or shortness of breath. 1 Inhaler 3  . albuterol (PROVENTIL) (2.5 MG/3ML) 0.083% nebulizer solution Take 3 mLs (2.5 mg total) by nebulization every 6 (six) hours as needed for wheezing or shortness of breath. 360 mL 12  . buPROPion (WELLBUTRIN SR) 100 MG 12 hr tablet Take 100 mg by mouth 2 (two) times daily.      . Multiple Vitamin (MULTIVITAMIN) capsule Take 1 capsule by mouth daily.    . OXYGEN Inhale 2 L into the lungs as needed (for shortness of breath).     . raloxifene (EVISTA) 60 MG tablet Take 60 mg by mouth daily.      . SEREVENT DISKUS 50 MCG/DOSE diskus inhaler INHALE 1 PUFF INTO LUNGS TWICE DAILY 1 Inhaler 5  . SPIRIVA RESPIMAT 1.25 MCG/ACT AERS INHALE 2 PUFFS INTO THE LUNGS DAILY 4 g 11  . Tetrahydrozoline HCl (VISINE OP) Place 1 drop into both eyes as needed (for dry eyes).    Marland Kitchen doxycycline (VIBRA-TABS) 100 MG tablet Take 1 tablet (100 mg total) by mouth 2 (two) times daily. 14 tablet 0   No facility-administered medications prior to visit.    Review of Systems  Review of Systems  Constitutional: Negative.   HENT: Negative.   Respiratory: Negative for cough, shortness of breath and wheezing.        DOE with stairs   Cardiovascular: Negative.  Physical Exam  BP 110/70 (BP Location: Left Arm, Cuff Size: Normal)   Pulse 75   Temp 98.1 F (36.7 C)   Ht 5\' 3"  (1.6 m)   Wt 128 lb 9.6 oz (58.3 kg)   SpO2 92%   BMI 22.78 kg/m  Physical Exam Constitutional:      General: She is not in acute distress.    Appearance: Normal appearance. She is normal weight.  Neck:     Musculoskeletal: Normal range of motion and neck supple.  Cardiovascular:     Rate and Rhythm: Normal rate and regular rhythm.  Pulmonary:     Breath sounds: No wheezing or rhonchi.     Comments: LS diminished, clear. No resp distress.  Musculoskeletal: Normal range of motion.  Skin:    General: Skin is warm and dry.  Neurological:      General: No focal deficit present.     Mental Status: She is alert and oriented to person, place, and time. Mental status is at baseline.  Psychiatric:        Mood and Affect: Mood normal.        Behavior: Behavior normal.        Thought Content: Thought content normal.        Judgment: Judgment normal.      Lab Results:  CBC    Component Value Date/Time   WBC 9.4 06/08/2018 2132   RBC 5.10 01/19/2019 1130   HGB 14.7 01/19/2019 1130   HCT 44.8 01/19/2019 1130   PLT 221 06/08/2018 2132   MCV 87.8 01/19/2019 1130   MCH 28.8 01/19/2019 1130   MCHC 34.4 06/08/2018 2132   RDW 14.5 01/19/2019 1130   LYMPHSABS 3.9 06/08/2018 2132   MONOABS 0.5 06/08/2018 2132   EOSABS 0.2 06/08/2018 2132   BASOSABS 0.1 06/08/2018 2132    BMET    Component Value Date/Time   NA 140 06/08/2018 2132   K 3.9 06/08/2018 2132   CL 109 06/08/2018 2132   CO2 24 06/08/2018 2132   GLUCOSE 120 (H) 06/08/2018 2132   BUN 20 06/08/2018 2132   CREATININE 1.03 (H) 06/08/2018 2132   CREATININE 0.75 10/28/2016 1101   CALCIUM 9.3 06/08/2018 2132   GFRNONAA 58 (L) 06/08/2018 2132   GFRAA >60 06/08/2018 2132    BNP    Component Value Date/Time   BNP 14.0 09/02/2017 1631    ProBNP No results found for: PROBNP  Imaging: No results found.   Assessment & Plan:   COPD with emphysema ([place]) - Stable; no recent exacerbations and patient does not experience daily symptoms - Lungs are diminished but clear on exam  - Continue Spiriva respimat 1.25 daily - Instructed patient to use Serevent disc TWICE daily - Add incentive spirometer daily  - Encourage patient stay active - Strongly recommend patient quit smoking  - FU in 4 months    Martyn Ehrich, NP 05/18/2019

## 2019-05-18 NOTE — Assessment & Plan Note (Signed)
-   Stable; no recent exacerbations and patient does not experience daily symptoms - Lungs are diminished but clear on exam  - Continue Spiriva respimat 1.25 daily - Instructed patient to use Serevent disc TWICE daily - Add incentive spirometer daily  - Encourage patient stay active - Strongly recommend patient quit smoking  - FU in 4 months

## 2019-05-18 NOTE — Patient Instructions (Signed)
Continue to stay active  Work on quitting smoking (cut back and pick a date. You can do it!!!)  Needs incentive spirometer - take 5 deep breaths every hour  Use serevent disc twice daily- take 1 puff   Continue Spiriva respimat - take 2 puffs once daily   Follow up with Dr. Lake Bells in 4 months or sooner if needed     Coping with Quitting Smoking  Quitting smoking is a physical and mental challenge. You will face cravings, withdrawal symptoms, and temptation. Before quitting, work with your health care provider to make a plan that can help you cope. Preparation can help you quit and keep you from giving in. How can I cope with cravings? Cravings usually last for 5-10 minutes. If you get through it, the craving will pass. Consider taking the following actions to help you cope with cravings:  Keep your mouth busy: ? Chew sugar-free gum. ? Suck on hard candies or a straw. ? Brush your teeth.  Keep your hands and body busy: ? Immediately change to a different activity when you feel a craving. ? Squeeze or play with a ball. ? Do an activity or a hobby, like making bead jewelry, practicing needlepoint, or working with wood. ? Mix up your normal routine. ? Take a short exercise break. Go for a quick walk or run up and down stairs. ? Spend time in public places where smoking is not allowed.  Focus on doing something kind or helpful for someone else.  Call a friend or family member to talk during a craving.  Join a support group.  Call a quit line, such as 1-800-QUIT-NOW.  Talk with your health care provider about medicines that might help you cope with cravings and make quitting easier for you. How can I deal with withdrawal symptoms? Your body may experience negative effects as it tries to get used to not having nicotine in the system. These effects are called withdrawal symptoms. They may include:  Feeling hungrier than normal.  Trouble concentrating.  Irritability.   Trouble sleeping.  Feeling depressed.  Restlessness and agitation.  Craving a cigarette. To manage withdrawal symptoms:  Avoid places, people, and activities that trigger your cravings.  Remember why you want to quit.  Get plenty of sleep.  Avoid coffee and other caffeinated drinks. These may worsen some of your symptoms. How can I handle social situations? Social situations can be difficult when you are quitting smoking, especially in the first few weeks. To manage this, you can:  Avoid parties, bars, and other social situations where people might be smoking.  Avoid alcohol.  Leave right away if you have the urge to smoke.  Explain to your family and friends that you are quitting smoking. Ask for understanding and support.  Plan activities with friends or family where smoking is not an option. What are some ways I can cope with stress? Wanting to smoke may cause stress, and stress can make you want to smoke. Find ways to manage your stress. Relaxation techniques can help. For example:  Breathe slowly and deeply, in through your nose and out through your mouth.  Listen to soothing, relaxing music.  Talk with a family member or friend about your stress.  Light a candle.  Soak in a bath or take a shower.  Think about a peaceful place. What are some ways I can prevent weight gain? Be aware that many people gain weight after they quit smoking. However, not everyone does. To keep  from gaining weight, have a plan in place before you quit and stick to the plan after you quit. Your plan should include:  Having healthy snacks. When you have a craving, it may help to: ? Eat plain popcorn, crunchy carrots, celery, or other cut vegetables. ? Chew sugar-free gum.  Changing how you eat: ? Eat small portion sizes at meals. ? Eat 4-6 small meals throughout the day instead of 1-2 large meals a day. ? Be mindful when you eat. Do not watch television or do other things that might  distract you as you eat.  Exercising regularly: ? Make time to exercise each day. If you do not have time for a long workout, do short bouts of exercise for 5-10 minutes several times a day. ? Do some form of strengthening exercise, like weight lifting, and some form of aerobic exercise, like running or swimming.  Drinking plenty of water or other low-calorie or no-calorie drinks. Drink 6-8 glasses of water daily, or as much as instructed by your health care provider. Summary  Quitting smoking is a physical and mental challenge. You will face cravings, withdrawal symptoms, and temptation to smoke again. Preparation can help you as you go through these challenges.  You can cope with cravings by keeping your mouth busy (such as by chewing gum), keeping your body and hands busy, and making calls to family, friends, or a helpline for people who want to quit smoking.  You can cope with withdrawal symptoms by avoiding places where people smoke, avoiding drinks with caffeine, and getting plenty of rest.  Ask your health care provider about the different ways to prevent weight gain, avoid stress, and handle social situations. This information is not intended to replace advice given to you by your health care provider. Make sure you discuss any questions you have with your health care provider. Document Released: 11/29/2016 Document Revised: 11/29/2016 Document Reviewed: 11/29/2016 Elsevier Interactive Patient Education  2019 [employer].

## 2019-05-18 NOTE — Progress Notes (Signed)
Reviewed, agree 

## 2019-08-20 ENCOUNTER — Ambulatory Visit: Payer: Medicare Other

## 2019-08-24 ENCOUNTER — Inpatient Hospital Stay: Admission: RE | Admit: 2019-08-24 | Payer: Medicare Other | Source: Ambulatory Visit

## 2019-08-25 ENCOUNTER — Telehealth: Payer: Self-pay | Admitting: Acute Care

## 2019-08-26 ENCOUNTER — Other Ambulatory Visit: Payer: Self-pay

## 2019-08-26 DIAGNOSIS — Z87891 Personal history of nicotine dependence: Secondary | ICD-10-CM

## 2019-08-26 DIAGNOSIS — Z720 Tobacco use: Secondary | ICD-10-CM

## 2019-08-27 NOTE — Telephone Encounter (Signed)
Type Date User Summary Attachment  General 08/26/2019 2:03 PM [name] [name] - -  Note   CT CHEST LUNG CANCER SCREENING Scan [facility] [address] on 09/21/2019 @ 9:15am Erline Levine scheduled with the Patient.[name] [name]         Nothing further needed.

## 2019-09-21 ENCOUNTER — Ambulatory Visit (INDEPENDENT_AMBULATORY_CARE_PROVIDER_SITE_OTHER)
Admission: RE | Admit: 2019-09-21 | Discharge: 2019-09-21 | Disposition: A | Discharge status: Home / Self Care | Payer: Medicare Other | Source: Ambulatory Visit | Attending: Primary Care | Admitting: Primary Care

## 2019-09-21 ENCOUNTER — Other Ambulatory Visit: Payer: Self-pay

## 2019-09-21 DIAGNOSIS — Z87891 Personal history of nicotine dependence: Secondary | ICD-10-CM

## 2019-09-21 DIAGNOSIS — Z720 Tobacco use: Secondary | ICD-10-CM

## 2019-09-23 ENCOUNTER — Telehealth: Payer: Self-pay | Admitting: Acute Care

## 2019-09-23 DIAGNOSIS — Z122 Encounter for screening for malignant neoplasm of respiratory organs: Secondary | ICD-10-CM

## 2019-09-23 DIAGNOSIS — F17210 Nicotine dependence, cigarettes, uncomplicated: Secondary | ICD-10-CM

## 2019-09-23 DIAGNOSIS — Z87891 Personal history of nicotine dependence: Secondary | ICD-10-CM

## 2019-09-23 NOTE — Telephone Encounter (Signed)
Pt informed of CT results per Sarah Groce, NP.  PT verbalized understanding.  Copy sent to PCP.  Order placed for 1 yr f/u CT.  

## 2019-09-30 ENCOUNTER — Other Ambulatory Visit: Payer: Self-pay | Admitting: Internal Medicine

## 2019-09-30 DIAGNOSIS — Z1231 Encounter for screening mammogram for malignant neoplasm of breast: Secondary | ICD-10-CM

## 2019-11-17 ENCOUNTER — Other Ambulatory Visit: Payer: Self-pay | Admitting: Pulmonary Disease

## 2019-11-23 ENCOUNTER — Other Ambulatory Visit: Payer: Self-pay

## 2019-11-23 ENCOUNTER — Ambulatory Visit
Admission: RE | Admit: 2019-11-23 | Discharge: 2019-11-23 | Disposition: A | Discharge status: Home / Self Care | Payer: Medicare Other | Source: Ambulatory Visit | Attending: Internal Medicine | Admitting: Internal Medicine

## 2019-11-23 DIAGNOSIS — Z1231 Encounter for screening mammogram for malignant neoplasm of breast: Secondary | ICD-10-CM

## 2019-11-24 ENCOUNTER — Other Ambulatory Visit: Payer: Self-pay | Admitting: Internal Medicine

## 2019-11-24 DIAGNOSIS — R928 Other abnormal and inconclusive findings on diagnostic imaging of breast: Secondary | ICD-10-CM

## 2019-12-02 ENCOUNTER — Ambulatory Visit
Admission: RE | Admit: 2019-12-02 | Discharge: 2019-12-02 | Disposition: A | Discharge status: Home / Self Care | Payer: Medicare Other | Source: Ambulatory Visit | Attending: Internal Medicine | Admitting: Internal Medicine

## 2019-12-02 ENCOUNTER — Other Ambulatory Visit: Payer: Self-pay

## 2019-12-02 DIAGNOSIS — R928 Other abnormal and inconclusive findings on diagnostic imaging of breast: Secondary | ICD-10-CM

## 2019-12-23 ENCOUNTER — Other Ambulatory Visit: Payer: Self-pay | Admitting: Primary Care

## 2020-01-06 ENCOUNTER — Other Ambulatory Visit: Payer: Self-pay | Admitting: Obstetrics and Gynecology

## 2020-01-15 ENCOUNTER — Other Ambulatory Visit: Payer: Self-pay | Admitting: Internal Medicine

## 2020-01-15 DIAGNOSIS — R0989 Other specified symptoms and signs involving the circulatory and respiratory systems: Secondary | ICD-10-CM

## 2020-01-15 DIAGNOSIS — F17200 Nicotine dependence, unspecified, uncomplicated: Secondary | ICD-10-CM

## 2020-01-20 ENCOUNTER — Ambulatory Visit
Admission: RE | Admit: 2020-01-20 | Discharge: 2020-01-20 | Disposition: A | Discharge status: Home / Self Care | Payer: Medicare Other | Source: Ambulatory Visit | Attending: Internal Medicine | Admitting: Internal Medicine

## 2020-01-20 DIAGNOSIS — F17200 Nicotine dependence, unspecified, uncomplicated: Secondary | ICD-10-CM

## 2020-01-20 DIAGNOSIS — R0989 Other specified symptoms and signs involving the circulatory and respiratory systems: Secondary | ICD-10-CM

## 2020-02-28 ENCOUNTER — Other Ambulatory Visit: Payer: Self-pay | Admitting: Pulmonary Disease

## 2020-04-05 ENCOUNTER — Other Ambulatory Visit: Payer: Self-pay | Admitting: Primary Care

## 2020-04-11 ENCOUNTER — Telehealth: Payer: Self-pay | Admitting: Primary Care

## 2020-04-11 MED ORDER — SPIRIVA RESPIMAT 1.25 MCG/ACT IN AERS: INHALATION_SPRAY | RESPIRATORY_TRACT | 3 refills | Status: DC

## 2020-04-11 NOTE — Telephone Encounter (Addendum)
Patient has been set up for appointment to establish care with Dr. Melvyn Novas on 6/1 at 11:30 and Spiriva has been refilled. Nothing further needed at this time.

## 2020-05-04 ENCOUNTER — Ambulatory Visit: Payer: Medicare Other | Admitting: Internal Medicine

## 2020-05-16 ENCOUNTER — Ambulatory Visit: Payer: Medicare Other | Admitting: Internal Medicine

## 2020-05-25 ENCOUNTER — Other Ambulatory Visit: Payer: Self-pay

## 2020-05-25 ENCOUNTER — Ambulatory Visit (INDEPENDENT_AMBULATORY_CARE_PROVIDER_SITE_OTHER): Payer: Medicare Other | Admitting: Internal Medicine

## 2020-05-25 ENCOUNTER — Encounter: Payer: Self-pay | Admitting: Internal Medicine

## 2020-05-25 ENCOUNTER — Ambulatory Visit (INDEPENDENT_AMBULATORY_CARE_PROVIDER_SITE_OTHER): Payer: Medicare Other

## 2020-05-25 VITALS — BP 122/88 | HR 72 | Temp 97.1°F | Ht 63.5 in | Wt 131.2 lb

## 2020-05-25 DIAGNOSIS — J449 Chronic obstructive pulmonary disease, unspecified: Secondary | ICD-10-CM

## 2020-05-25 DIAGNOSIS — F17210 Nicotine dependence, cigarettes, uncomplicated: Secondary | ICD-10-CM | POA: Diagnosis not present

## 2020-05-25 DIAGNOSIS — J441 Chronic obstructive pulmonary disease with (acute) exacerbation: Secondary | ICD-10-CM

## 2020-05-25 MED ORDER — FAMOTIDINE 20 MG PO TABS
ORAL_TABLET | ORAL | 11 refills | Status: DC
Start: 2020-05-25 — End: 2021-12-11

## 2020-05-25 MED ORDER — AZITHROMYCIN 250 MG PO TABS: ORAL_TABLET | ORAL | 0 refills | Status: DC

## 2020-05-25 MED ORDER — PANTOPRAZOLE SODIUM 40 MG PO TBEC
40.0000 mg | DELAYED_RELEASE_TABLET | Freq: Every day | ORAL | 2 refills | Status: DC
Start: 2020-05-25 — End: 2020-06-13

## 2020-05-25 MED ORDER — STIOLTO RESPIMAT 2.5-2.5 MCG/ACT IN AERS
2.0000 | INHALATION_SPRAY | Freq: Every day | RESPIRATORY_TRACT | 0 refills | Status: DC
Start: 2020-05-25 — End: 2020-06-13

## 2020-05-25 MED ORDER — PREDNISONE 10 MG PO TABS
ORAL_TABLET | ORAL | 0 refills | Status: DC
Start: 2020-05-25 — End: 2020-06-13

## 2020-05-25 NOTE — Patient Instructions (Addendum)
Stop serent and spiriva and start stiolto 2 puffs each am   Prednisone 10 mg take  4 each am x 2 days,   2 each am x 2 days,  1 each am x 2 days and stop   zpak   Pantoprazole (protonix) 40 mg   Take  30-60 min before first meal of the day and Pepcid (famotidine)  20 mg one after supper  until return to office - this is the best way to tell whether stomach acid is contributing to your problem.    Please remember to go to the  x-ray department  for your tests - we will call you with the results when they are available    The key is to stop smoking completely before smoking completely stops you!      Please schedule a follow up office visit in 2  weeks, sooner if needed  with all medications /inhalers/ solutions in hand so we can verify exactly what you are taking. This includes all medications from all doctors and over the counters - Nurse practioner is fine

## 2020-05-25 NOTE — Progress Notes (Addendum)
[name] [name], female    DOB: [birth year],    MRN: [id number]   Synopsis: First referred in 2017 for evaluation of COPD/ active smoker 2016 pulmonary function testing from [facility] showed FEV1 of 870 mL (37% predicted) consistent with severe airflow obstruction 2015 CT scan from [facility] showed severe panlobular emphysema She is prescribed 2 L O2 qHS and she sometimes uses a portable tank.     06/10/2018  Acute extended  ov/Fidelia Cathers re: post ER eval  06/08/18 dx aecopd/bronchitis rx zpak  / pt with GOLD III criteria / still smoking Chief Complaint  Patient presents with  . Acute Visit    hemoptysis since 06/08/18.    Dyspnea:  MMRC2 = can't walk a nl pace on a flat grade s sob but does fine slow and flat does [employer] 0k s 02 and that hasn't changed since onset of hemoptysis Cough: acute onset  on 06/08/18 mostly mucoid with streaky hemoptysis assoc with lots of nasal congestion but no purulent / bloody d/c  SABA use: has it and says helps but hfa at 0% effective baseline technique rec Prednisone 10 mg take  4 each am x 2 days,   2 each am x 2 days,  1 each am x 2 days and stop  Finish your antibiotics  Try change spiriva to the respimat 1.25  X 4 pffs each am - no change on serevent for now  Work on inhaler technique: e only use your albuterol (PROVENTIL)  as a rescue medication   05/25/2020  Acute extended ov/Emalyn Schou re: cough / still smoking / maint on serevent / spriva smi and s/p 2nd pfizer vaccine 04/10/20  Chief Complaint  Patient presents with  . Acute Visit    COPD, more coughing 1 month  Dyspnea:  Worse with cough x 1 month Cough: slt green with lump in throat "all the time  Sleeping: occ wakes up  SABA use: avg maybe 2-3 per week  02: 2lpm   No obvious day to day or daytime variability or assoc excess/ purulent sputum or mucus plugs or hemoptysis or cp or chest tightness, subjective wheeze or overt sinus or hb symptoms.   Sleeping bedtime  without nocturnal  or early am exacerbation   of respiratory  c/o's or need for noct saba. Also denies any obvious fluctuation of symptoms with weather or environmental changes or other aggravating or alleviating factors except as outlined above   No unusual exposure hx or h/o childhood pna/ asthma or knowledge of premature birth.  Current Allergies, Complete Past Medical History, Past Surgical History, Family History, and Social History were reviewed in [employer] record.  ROS  The following are not active complaints unless bolded Hoarseness, sore throat= globus, dysphagia, dental problems, itching, sneezing,  nasal congestion or discharge of excess mucus or purulent secretions, ear ache,   fever, chills, sweats, unintended wt loss or wt gain, classically pleuritic or exertional cp,  orthopnea pnd or arm/hand swelling  or leg swelling, presyncope, palpitations, abdominal pain, anorexia, nausea, vomiting, diarrhea  or change in bowel habits or change in bladder habits, change in stools or change in urine, dysuria, hematuria,  rash, arthralgias, visual complaints, headache, numbness, weakness or ataxia or problems with walking or coordination,  change in mood or  memory.        Current Meds  Medication Sig  . albuterol (PROVENTIL HFA;VENTOLIN HFA) 108 (90 Base) MCG/ACT inhaler Inhale 2 puffs into the lungs every 6 (six) hours  as needed for wheezing or shortness of breath.  Marland Kitchen albuterol (PROVENTIL) (2.5 MG/3ML) 0.083% nebulizer solution Take 3 mLs (2.5 mg total) by nebulization every 6 (six) hours as needed for wheezing or shortness of breath.  Marland Kitchen amLODipine (NORVASC) 5 MG tablet Take 5 mg by mouth daily.  Marland Kitchen buPROPion (WELLBUTRIN SR) 100 MG 12 hr tablet Take 100 mg by mouth 2 (two) times daily.    . Multiple Vitamin (MULTIVITAMIN) capsule Take 1 capsule by mouth daily.  . OXYGEN Inhale 2 L into the lungs as needed (for shortness of breath).   . raloxifene (EVISTA) 60 MG tablet Take 60 mg by mouth daily.    . SEREVENT  DISKUS 50 MCG/DOSE diskus inhaler INHALE 1 PUFF INTO THE LUNGS TWICE DAILY  . solifenacin (VESICARE) 5 MG tablet Take 5 mg by mouth daily.  . Tetrahydrozoline HCl (VISINE OP) Place 1 drop into both eyes as needed (for dry eyes).  . Tiotropium Bromide Monohydrate (SPIRIVA RESPIMAT) 1.25 MCG/ACT AERS Inhale 2 puffs into the lungs daily.                   Objective:    Ambulatory pleasant bf nad      05/25/2020       131  06/10/18 129 lb 9.6 oz (58.8 kg)  03/17/18 133 lb 12.8 oz (60.7 kg)  01/08/18 138 lb (62.6 kg)    Vital signs reviewed  05/25/2020  - Note at rest 02 sats  98% on RA       HEENT : pt wearing mask not removed for exam due to covid -19 concerns.    NECK :  without JVD/Nodes/TM/ nl carotid upstrokes bilaterally   LUNGS: no acc muscle use,  Mod barrel  contour chest wall with bilateral  Distant bs s audible wheeze and  without cough on insp or exp maneuvers and mod  Hyperresonant  to  percussion bilaterally     CV:  RRR  no s3 or murmur or increase in P2, and no edema   ABD:  soft and nontender with pos mid insp Hoover's  in the supine position. No bruits or organomegaly appreciated, bowel sounds nl  MS:     ext warm without deformities, calf tenderness, cyanosis or clubbing No obvious joint restrictions   SKIN: warm and dry without lesions    NEURO:  alert, approp, nl sensorium with  no motor or cerebellar deficits apparent.       . CXR PA and Lateral:   05/25/2020 :    I personally reviewed images and agree with radiology impression as follows:         Assessment

## 2020-05-26 ENCOUNTER — Encounter: Payer: Self-pay | Admitting: Internal Medicine

## 2020-05-26 NOTE — Assessment & Plan Note (Signed)
Counseled re importance of smoking cessation but did not meet time criteria for separate billing           Each maintenance medication was reviewed in detail including emphasizing most importantly the difference between maintenance and prns and under what circumstances the prns are to be triggered using an action plan format where appropriate.  Total time for H and P, chart review, counseling, teaching device and generating customized AVS unique to this acute office visit / charting = 30 min for pt not seen in 2 years.

## 2020-05-26 NOTE — Assessment & Plan Note (Addendum)
Active smoker  - PFT's  04/08/2006  FEV1 1.32 (51 % ) ratio 0.53  p 8 % improvement from saba p ? prior to study with   A  FV curve classic moderate concavity   06/10/2018  After extensive coaching inhaler device  effectiveness =   50% from a baseline of 0   - 05/25/2020  After extensive coaching inhaler device,  effectiveness =    75% (poor insp flow, short Ti) > try stiolto 2 puffs each am   Mild flare in pt with previous GOLD II copd maint  on serevent /spiriva and trouble using devices so simplify to stiolto for now @ 2 pffs each am and rx acutely with zpak/ Prednisone 10 mg take  4 each am x 2 days,   2 each am x 2 days,  1 each am x 2 days and stop then recheck at 2 weeks - since cough is a main feature may not do well on dpi but at present not confident she can use hfa or smi correctly.  Also needs rx for gerd to control cough and set up for pfts p manage the acute problems/advised

## 2020-05-26 NOTE — Progress Notes (Signed)
Spoke with patient and provided her CXR results.  She verbalized understanding.

## 2020-06-13 ENCOUNTER — Encounter: Payer: Self-pay | Admitting: Primary Care

## 2020-06-13 ENCOUNTER — Other Ambulatory Visit: Payer: Self-pay

## 2020-06-13 ENCOUNTER — Ambulatory Visit (INDEPENDENT_AMBULATORY_CARE_PROVIDER_SITE_OTHER): Payer: Medicare Other | Admitting: Primary Care

## 2020-06-13 VITALS — BP 122/80 | HR 90 | Temp 97.2°F | Ht 64.5 in | Wt 130.8 lb

## 2020-06-13 DIAGNOSIS — J9611 Chronic respiratory failure with hypoxia: Secondary | ICD-10-CM | POA: Diagnosis not present

## 2020-06-13 DIAGNOSIS — J449 Chronic obstructive pulmonary disease, unspecified: Secondary | ICD-10-CM

## 2020-06-13 DIAGNOSIS — J439 Emphysema, unspecified: Secondary | ICD-10-CM

## 2020-06-13 MED ORDER — STIOLTO RESPIMAT 2.5-2.5 MCG/ACT IN AERS: 2.0000 | INHALATION_SPRAY | Freq: Every day | RESPIRATORY_TRACT | 3 refills | Status: DC

## 2020-06-13 MED ORDER — ALBUTEROL SULFATE HFA 108 (90 BASE) MCG/ACT IN AERS: 2.0000 | INHALATION_SPRAY | Freq: Four times a day (QID) | RESPIRATORY_TRACT | 3 refills | Status: DC | PRN

## 2020-06-13 NOTE — Assessment & Plan Note (Signed)
-   O2 88% RA with ambulation; recovered to 92% RA with rest - Patient has oxygen at home and uses 2L prn and at night

## 2020-06-13 NOTE — Progress Notes (Signed)
@Patient  ID: [name] [name], female    DOB: 1961/02/18, 59 y.o.   MRN: [id number]  Chief Complaint  Patient presents with  . Follow-up    COPD, doing good, no issues    Referring provider: Jilda Panda, MD   Synopsis: First referred in 2017 for evaluation of COPD/ active smoker 2016 pulmonary function testing from [facility] showed FEV1 of 870 mL (37% predicted) consistent with severe airflow obstruction 2015 CT scan from [place] showed severe panlobular emphysema She is prescribed 2 L O2 qHS and she sometimes uses a portable tank.   HPI: 59 year old female, current smoker.  Past medical history significant for COPD, bullous emphysema, chronic respiratory failure with hypoxia.  Former patient of Dr. Lake Bells, recently established with Dr. Melvyn Novas as a new patient on 05/25/2020.  During this visit she was treated for mild flare with azithromycin and prednisone taper.  Chest x-ray showed no acute abnormality, stable changes of COPD.  She was started on Protonix and Pepcid for GERD.  Maintenance inhaler Serevent/Spiriva was changed to Stiolto 2 puffs daily.   06/13/2020 Presents today for 2-week follow-up. She states Protonix causes her to have a headache. She is taking pepcid at bedtime. States that she can't tell difference on Stiolto but breathing has not worsened after stopping ICS. She is not currently using SABA because she needs a refill, states that she needs it more when its hot outside. Her O2 level was 88% RA after ambulation, recovered to 92% RA with rest.  She has shortness of breath mainly when walking outside in the heat. She does not have any significant shortness of breath with her ADLs. Cough is productive with clear-green mucus. LDCT in 2020 showed advanced emphysema. She is still smoking 1/2 pack daily.  Pulmonary function testing: - PFT's  04/08/2006  FEV1 1.32 (51 % ) ratio 0.53  p 8 % improvement from saba p ? prior to study with   A  FV curve classic moderate concavity    - PFTs 2016  FEV1 0.92, FVC 2.25, ratio 40/ severe airflow obstruction   Imaging: CXR 05/26/20- Stable normal sized heart. The lungs remain hyperexpanded with bilateral bullous changes. Mild scoliosis. IMPRESSION: No acute abnormality. Stable changes of COPD.  09/21/19 LDCT- 1. Lung-RADS 2, benign appearance or behavior. Continue annual screening with low-dose chest CT without contrast in 12 months. 2. Emphysema. 3. Thoracic aortic atherosclerosis.  No Known Allergies  Immunization History  Administered Date(s) Administered  . Influenza Split 09/27/2014, 12/06/2015, 11/18/2018  . Influenza,inj,Quad PF,6+ Mos 08/27/2016, 09/05/2017, 09/21/2019  . PFIZER SARS-COV-2 Vaccination 02/27/2020, 04/10/2020    Past Medical History:  Diagnosis Date  . Anemia   . Anxiety   . Arthritis    OSTEOARTHRITIS  . Blood dyscrasia    EASY BLEEDER  . Colon polyps   . COPD (chronic obstructive pulmonary disease) ([place])   . Depression   . Osteoporosis   . Seasonal allergies   . Sickle cell trait ([place])   . Uterine fibroid     Tobacco History: Social History   Tobacco Use  Smoking Status Current Some Day Smoker  . Packs/day: 0.50  . Years: 42.00  . Pack years: 21.00  . Types: Cigarettes  Smokeless Tobacco Never Used   Ready to quit: No Counseling given: Yes   Outpatient Medications Prior to Visit  Medication Sig Dispense Refill  . amLODipine (NORVASC) 5 MG tablet Take 5 mg by mouth daily.    Marland Kitchen buPROPion (WELLBUTRIN SR) 100 MG 12 hr  tablet Take 100 mg by mouth 2 (two) times daily.      . famotidine (PEPCID) 20 MG tablet One after supper 30 tablet 11  . Multiple Vitamin (MULTIVITAMIN) capsule Take 1 capsule by mouth daily.    . OXYGEN Inhale 2 L into the lungs as needed (for shortness of breath).     . raloxifene (EVISTA) 60 MG tablet Take 60 mg by mouth daily.      . solifenacin (VESICARE) 5 MG tablet Take 5 mg by mouth daily.    . Tetrahydrozoline HCl (VISINE OP) Place 1 drop into both eyes as  needed (for dry eyes).    Marland Kitchen albuterol (PROVENTIL HFA;VENTOLIN HFA) 108 (90 Base) MCG/ACT inhaler Inhale 2 puffs into the lungs every 6 (six) hours as needed for wheezing or shortness of breath. 1 Inhaler 3  . azithromycin (ZITHROMAX) 250 MG tablet Take 2 on day one then 1 daily x 4 days 6 tablet 0  . Tiotropium Bromide-Olodaterol (STIOLTO RESPIMAT) 2.5-2.5 MCG/ACT AERS Inhale 2 puffs into the lungs daily. 4 g 0  . albuterol (PROVENTIL) (2.5 MG/3ML) 0.083% nebulizer solution Take 3 mLs (2.5 mg total) by nebulization every 6 (six) hours as needed for wheezing or shortness of breath. (Patient not taking: Reported on 06/13/2020) 360 mL 12  . pantoprazole (PROTONIX) 40 MG tablet Take 1 tablet (40 mg total) by mouth daily. Take 30-60 min before first meal of the day (Patient not taking: Reported on 06/13/2020) 30 tablet 2  . predniSONE (DELTASONE) 10 MG tablet Take  4 each am x 2 days,   2 each am x 2 days,  1 each am x 2 days and stop 14 tablet 0   No facility-administered medications prior to visit.   Review of Systems  Review of Systems  Constitutional: Negative.   Respiratory: Positive for cough. Negative for shortness of breath and wheezing.        Mild dyspnea on exertion   Cardiovascular: Negative.    Physical Exam  BP 122/80 (BP Location: Left Arm, Cuff Size: Normal)   Pulse 90   Temp (!) 97.2 F (36.2 C) (Oral)   Ht 5' 4.5" (1.638 m)   Wt 130 lb 12.8 oz (59.3 kg)   SpO2 92%   BMI 22.11 kg/m  Physical Exam Constitutional:      General: She is not in acute distress.    Appearance: Normal appearance.  HENT:     Head: Normocephalic and atraumatic.  Cardiovascular:     Rate and Rhythm: Normal rate and regular rhythm.     Comments: No edema Pulmonary:     Effort: Pulmonary effort is normal.     Breath sounds: No wheezing or rhonchi.     Comments: Diminished Skin:    General: Skin is warm and dry.  Neurological:     General: No focal deficit present.     Mental Status: She is  alert and oriented to person, place, and time. Mental status is at baseline.  Psychiatric:        Mood and Affect: Mood normal.        Behavior: Behavior normal.        Thought Content: Thought content normal.        Judgment: Judgment normal.      Lab Results:  CBC    Component Value Date/Time   WBC 9.4 06/08/2018 2132   RBC 5.10 01/19/2019 1130   HGB 14.7 01/19/2019 1130   HCT 44.8 01/19/2019 1130  PLT 221 06/08/2018 2132   MCV 87.8 01/19/2019 1130   MCH 28.8 01/19/2019 1130   MCHC 34.4 06/08/2018 2132   RDW 14.5 01/19/2019 1130   LYMPHSABS 3.9 06/08/2018 2132   MONOABS 0.5 06/08/2018 2132   EOSABS 0.2 06/08/2018 2132   BASOSABS 0.1 06/08/2018 2132    BMET    Component Value Date/Time   NA 140 06/08/2018 2132   K 3.9 06/08/2018 2132   CL 109 06/08/2018 2132   CO2 24 06/08/2018 2132   GLUCOSE 120 (H) 06/08/2018 2132   BUN 20 06/08/2018 2132   CREATININE 1.03 (H) 06/08/2018 2132   CREATININE 0.75 10/28/2016 1101   CALCIUM 9.3 06/08/2018 2132   GFRNONAA 58 (L) 06/08/2018 2132   GFRAA >60 06/08/2018 2132    BNP    Component Value Date/Time   BNP 14.0 09/02/2017 1631    ProBNP No results found for: PROBNP  Imaging: DG Chest 2 View  Result Date: 05/26/2020 CLINICAL DATA:  Cough.  COPD. EXAM: CHEST - 2 VIEW COMPARISON:  06/08/2018 FINDINGS: Stable normal sized heart. The lungs remain hyperexpanded with bilateral bullous changes. Mild scoliosis. IMPRESSION: No acute abnormality. Stable changes of COPD. Electronically Signed   By: Claudie Revering M.D.   On: 05/26/2020 09:47     Assessment & Plan:   COPD with emphysema ([place]) - COPD GOLD II/advanced emphysema on CT imaging - Continues to have productive cough. No significant shortness of breath with ADLs, dyspnea on exertion with heat.  - Recommend mucinex twice daily x 2 weeks - Continue Stiolto Respimat two puffs once daily (can consider Breztri if clincal symptoms worsen) - Refill Albuterol hfa 2 puffs  every 6 hours for breakthrough shortness of breath  - Needs repeat PFTs in 3 months - FU with Dr. Melvyn Novas after pulmonary function testing   Chronic respiratory failure with hypoxia ([place]) - O2 88% RA with ambulation; recovered to 92% RA with rest - Patient has oxygen at home and uses 2L prn and at night    Martyn Ehrich, NP 06/13/2020

## 2020-06-13 NOTE — Patient Instructions (Addendum)
Pleasure seeing you today [name] [name]  COPD: Continue Stiolto 2 puffs once daily in the morning Start taking mucinex twice daily for 2 weeks  Use 1-2L oxygen as needed on exertion to keep O2 level > 88-90% Strongly encourage you quit smoking- recommend you taper down amount and pick a quit date  GERD: Stop Protonix Continue Pepcid at bedtime    Orders: PFTs in 2-3 moths  Follow-up: 3 months with Dr. Melvyn Novas Due for LDCT in October 2021 with lung cancer screening program

## 2020-06-13 NOTE — Assessment & Plan Note (Addendum)
-   COPD GOLD III/advanced emphysema on CT imaging - Continues to have productive cough. No significant shortness of breath with ADLs, dyspnea on exertion with heat.  - Recommend mucinex twice daily x 2 weeks - Continue Stiolto Respimat two puffs once daily (can consider Breztri if clincal symptoms worsen) - Refill Albuterol hfa 2 puffs every 6 hours for breakthrough shortness of breath  - Needs repeat PFTs in 3 months - FU with Dr. Melvyn Novas after pulmonary function testing

## 2020-08-15 ENCOUNTER — Other Ambulatory Visit: Payer: Self-pay | Admitting: Primary Care

## 2020-08-17 ENCOUNTER — Telehealth: Payer: Self-pay | Admitting: Internal Medicine

## 2020-08-17 MED ORDER — STIOLTO RESPIMAT 2.5-2.5 MCG/ACT IN AERS: 2.0000 | INHALATION_SPRAY | Freq: Every day | RESPIRATORY_TRACT | 1 refills | Status: DC

## 2020-08-17 NOTE — Telephone Encounter (Signed)
Pt returning a phone call. Pt can be reached at [phone]

## 2020-08-17 NOTE — Telephone Encounter (Signed)
Looked at last OV pt had with MW and it said:  Patient Instructions by Tanda Rockers, MD at 05/25/2020 10:00 AM Author: Tanda Rockers, MD Author Type: Physician Filed: 05/25/2020 10:44 AM  Note Status: Addendum Cosign: Cosign Not Required Encounter Date: 05/25/2020  Editor: Tanda Rockers, MD (Physician)      Prior Versions: 1. Tanda Rockers, MD (Physician) at 05/25/2020 10:42 AM - Addendum   2. Tanda Rockers, MD (Physician) at 05/25/2020 10:32 AM - Signed      Stop serent and spiriva and start stiolto 2 puffs each am         Attempted to call pt but unable to reach. Left message for her to return call.

## 2020-08-17 NOTE — Telephone Encounter (Signed)
Called spoke with patient. She states she ran out of it and wants to change it but she will discuss with Dr. Melvyn Novas at her appointment in October.   Refill sent it to get to next appointment made note in appointment note to address this at next office visit.

## 2020-09-10 ENCOUNTER — Emergency Department (HOSPITAL_COMMUNITY): Payer: Medicare Other

## 2020-09-10 ENCOUNTER — Emergency Department (HOSPITAL_COMMUNITY)
Admission: EM | Admit: 2020-09-10 | Discharge: 2020-09-10 | Disposition: A | Discharge status: Home / Self Care | Payer: Medicare Other | Attending: Emergency Medicine | Admitting: Emergency Medicine

## 2020-09-10 ENCOUNTER — Other Ambulatory Visit: Payer: Self-pay

## 2020-09-10 ENCOUNTER — Encounter (HOSPITAL_COMMUNITY): Payer: Self-pay | Admitting: Emergency Medicine

## 2020-09-10 DIAGNOSIS — J449 Chronic obstructive pulmonary disease, unspecified: Secondary | ICD-10-CM | POA: Insufficient documentation

## 2020-09-10 DIAGNOSIS — Z79899 Other long term (current) drug therapy: Secondary | ICD-10-CM | POA: Insufficient documentation

## 2020-09-10 DIAGNOSIS — I10 Essential (primary) hypertension: Secondary | ICD-10-CM | POA: Diagnosis not present

## 2020-09-10 DIAGNOSIS — M79671 Pain in right foot: Secondary | ICD-10-CM

## 2020-09-10 DIAGNOSIS — F17210 Nicotine dependence, cigarettes, uncomplicated: Secondary | ICD-10-CM | POA: Insufficient documentation

## 2020-09-10 MED ORDER — PREDNISONE 10 MG PO TABS: 20.0000 mg | ORAL_TABLET | Freq: Every day | ORAL | 0 refills | Status: AC

## 2020-09-10 MED ORDER — COLCHICINE 0.6 MG PO TABS: 0.6000 mg | ORAL_TABLET | Freq: Every day | ORAL | 0 refills | Status: DC

## 2020-09-10 MED ORDER — IBUPROFEN 400 MG PO TABS
600.0000 mg | ORAL_TABLET | Freq: Once | ORAL | Status: AC
  Administered 2020-09-10: 600 mg via ORAL
  Filled 2020-09-10: qty 1

## 2020-09-10 MED ORDER — IBUPROFEN 800 MG PO TABS: 800.0000 mg | ORAL_TABLET | Freq: Once | ORAL | Status: DC

## 2020-09-10 NOTE — ED Provider Notes (Signed)
Medical screening examination/treatment/procedure(s) were conducted as a shared visit with non-physician practitioner(s) and myself.  I personally evaluated the patient during the encounter.  Clinical Impression:   Final diagnoses:  Foot pain, right    This patient has had an onset of right foot pain starting yesterday, on exam she does have mild swelling of the right ankle but no redness or warmth, normal pulses at the feet bilaterally, normal range of motion of all joints except for the right ankle which has significant pain with any range of motion.  There is no fevers or systemic symptoms, vital signs reviewed and are normal, x-ray reviewed showing no signs of significant bony abnormalities.  Will treat for possible primary acute gout.  Prednisone, colchicine, stable for discharge   Noemi Chapel, MD 09/16/20 [phone]

## 2020-09-10 NOTE — ED Triage Notes (Signed)
C/o R lateral foot pain since yesterday.  No known injury.

## 2020-09-10 NOTE — Progress Notes (Signed)
Orthopedic Tech Progress Note Patient Details:  SELIN EISLER [birth year] [id number]  Ortho Devices Type of Ortho Device: ASO, Crutches Ortho Device/Splint Location: Right Lower Extremity Ortho Device/Splint Interventions: Ordered, Application   Post Interventions Patient Tolerated: Well Instructions Provided: Adjustment of device, Care of device, Poper ambulation with device  Spoke with MD to apply ASO instead of CAM walker boot as patient did not want to wear a boot. Tammy Sours 09/10/2020, 2:11 PM

## 2020-09-10 NOTE — ED Provider Notes (Signed)
[facility] EMERGENCY DEPARTMENT Provider Note   CSN: [id number] Arrival date & time: 09/10/20  0254     History Chief Complaint  Patient presents with  . Foot Pain    [name] [name] is a 59 y.o. female 1 day of right lateral foot pain.  She denies any trauma to the foot, states that yesterday afternoon while she was working and her foot began to progressively hurt.  She reports swelling of her lateral foot and pain around "my veins hurt".  Her primary concern is that she does not want to bear weight on the foot, rating her pain a 7/10 at this time.  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 She denies numbness or tingling in the foot, she has never injured this foot before.  She has no history of gout.   She denies recent infection, fever, chills, nausea, vomiting.  Personally reviewed the patient's medical record.  She is treated for COPD usually on 2 L of oxygen at home, hypertension, depression, and GERD.  She does smoke.  HPI     Past Medical History:  Diagnosis Date  . Anemia   . Anxiety   . Arthritis    OSTEOARTHRITIS  . Blood dyscrasia    EASY BLEEDER  . Colon polyps   . COPD (chronic obstructive pulmonary disease) ([place])   . Depression   . Osteoporosis   . Seasonal allergies   . Sickle cell trait ([place])   . Uterine fibroid     Patient Active Problem List   Diagnosis Date Noted  . COPD exacerbation ([place]) 06/11/2018  . Protein calorie malnutrition ([place]) 05/21/2017  . Chronic respiratory failure with hypoxia ([place]) 05/21/2017  . Abnormal CT scan 12/11/2016  . Cigarette smoker 02/06/2016  . COPD with emphysema ([place]) 02/06/2016  . OBSTRUCTIVE CHRONIC BRONCHITIS WITHOUT EXACERBAT 12/01/2008  . 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 DEPRESSION 04/05/2008  . RESTRICTIVE LUNG DISEASE 04/05/2008  . GERD 04/05/2008  . DIVERTICULOSIS, COLON 04/05/2008  . IRRITABLE BOWEL SYNDROME  04/05/2008  . HEMORRHOIDS, HX OF 04/05/2008    Past Surgical History:  Procedure Laterality Date  . CESAREAN SECTION  1980  . DILATATION & CURETTAGE/HYSTEROSCOPY WITH MYOSURE N/A 09/30/2017   Procedure: DILATATION & CURETTAGE/HYSTEROSCOPY WITH MYOSURE MYOMECTOMY;  Surgeon: Thurnell Lose, MD;  Location: [facility];  Service: Gynecology;  Laterality: N/A;  PMB     OB History   No obstetric history on file.     Family History  Problem Relation Age of Onset  . Stomach cancer Father 10  . Throat cancer Brother   . Heart disease Mother   . Breast cancer Neg Hx     Social History   Tobacco Use  . Smoking status: Current Some Day Smoker    Packs/day: 0.50    Years: 42.00    Pack years: 21.00    Types: Cigarettes  . Smokeless tobacco: Never Used  Vaping Use  . Vaping Use: Never used  Substance Use Topics  . Alcohol use: Yes    Alcohol/week: 0.0 standard drinks    Comment: occasional  . Drug use: No    Home Medications Prior to Admission medications   Medication Sig Start Date End Date Taking? Authorizing Provider  albuterol (PROVENTIL) (2.5 MG/3ML) 0.083% nebulizer solution Take 3 mLs (2.5 mg total) by nebulization every 6 (six) hours as needed for wheezing or shortness of breath. Patient not  taking: Reported on 06/13/2020 02/06/16   Juanito Doom, MD  albuterol (VENTOLIN HFA) 108 (90 Base) MCG/ACT inhaler Inhale 2 puffs into the lungs every 6 (six) hours as needed for wheezing or shortness of breath. 06/13/20   Martyn Ehrich, NP  amLODipine (NORVASC) 5 MG tablet Take 5 mg by mouth daily. 05/11/20   [provider]  buPROPion (WELLBUTRIN SR) 100 MG 12 hr tablet Take 100 mg by mouth 2 (two) times daily.      [provider]  colchicine 0.6 MG tablet Take 1 tablet (0.6 mg total) by mouth daily. Take 2 tablets today as soon as you pick up the prescription.  And then 1 more tablet tonight before bed.  If your pain is persisting when you return tomorrow  morning continue to take 1 tablet in the morning and 1 at night until your pain resolves. 09/10/20   Braison Snoke, Gypsy Balsam, PA-C  famotidine (PEPCID) 20 MG tablet One after supper 05/25/20   Tanda Rockers, MD  Multiple Vitamin (MULTIVITAMIN) capsule Take 1 capsule by mouth daily.    [provider]  OXYGEN Inhale 2 L into the lungs as needed (for shortness of breath).     [provider]  predniSONE (DELTASONE) 10 MG tablet Take 2 tablets (20 mg total) by mouth daily for 5 days. 09/10/20 09/15/20  Waneda Klammer, Gypsy Balsam, PA-C  raloxifene (EVISTA) 60 MG tablet Take 60 mg by mouth daily.      [provider]  solifenacin (VESICARE) 5 MG tablet Take 5 mg by mouth daily. 05/11/20   [provider]  Tetrahydrozoline HCl (VISINE OP) Place 1 drop into both eyes as needed (for dry eyes).    [provider]  Tiotropium Bromide-Olodaterol (STIOLTO RESPIMAT) 2.5-2.5 MCG/ACT AERS Inhale 2 puffs into the lungs daily. 08/17/20   Tanda Rockers, MD    Allergies    Patient has no known allergies.  Review of Systems   Review of Systems  Constitutional: Negative for chills, diaphoresis and fever.  Respiratory: Negative.   Cardiovascular: Negative.   Gastrointestinal: Negative for abdominal pain, nausea and vomiting.  Musculoskeletal: Positive for gait problem, joint swelling and myalgias.  Skin: Negative.   Neurological: Negative for numbness.    Physical Exam Updated Vital Signs BP 131/80   Pulse 92   Temp 98.5 F (36.9 C) (Oral)   Resp 18   SpO2 95%   Physical Exam Vitals and nursing note reviewed.  HENT:     Head: Normocephalic and atraumatic.  Eyes:     General: No scleral icterus.       Right eye: No discharge.        Left eye: No discharge.     Conjunctiva/sclera: Conjunctivae normal.  Cardiovascular:     Pulses:          Dorsalis pedis pulses are 2+ on the right side and 2+ on the left side.  Pulmonary:     Effort: Pulmonary effort is  normal.  Musculoskeletal:       Feet:     Comments: Normal ROM in knees bilaterally, and in left ankle.  Right ankle with significant pain with any ROM.   Feet:     Right foot:     Skin integrity: Skin integrity normal.     Toenail Condition: Right toenails are abnormally thick and long.     Left foot:     Skin integrity: Skin integrity normal.     Toenail Condition: Left  toenails are abnormally thick and long.     Comments: Patient able to wiggle toes on the right foot.  Neurovascularly intact.  Skin:    General: Skin is warm and dry.     Capillary Refill: Capillary refill takes less than 2 seconds.     Findings: No abrasion, ecchymosis, erythema, signs of injury, lesion, rash or wound.  Neurological:     General: No focal deficit present.     Mental Status: She is alert.     Sensory: Sensation is intact.     Motor: Motor function is intact.  Psychiatric:        Mood and Affect: Mood normal.     ED Results / Procedures / Treatments   Labs (all labs ordered are listed, but only abnormal results are displayed) Labs Reviewed - No data to display  EKG None  Radiology DG Foot Complete Right  Result Date: 09/10/2020 CLINICAL DATA:  Lateral foot pain, no known injury, initial encounter EXAM: RIGHT FOOT COMPLETE - 3+ VIEW COMPARISON:  None. FINDINGS: Degenerative changes of the first MTP joint are noted with hallux valgus deformity. Mild soft tissue swelling is noted in this region as well. No acute fracture or dislocation is noted. No other soft tissue abnormality is noted. IMPRESSION: Degenerative changes without acute abnormality. Electronically Signed   By: Inez Catalina M.D.   On: 09/10/2020 10:06    Procedures Procedures (including critical care time)  Medications Ordered in ED Medications  ibuprofen (ADVIL) tablet 600 mg (600 mg Oral Given 09/10/20 1115)    ED Course  I have reviewed the triage vital signs and the nursing notes.  Pertinent labs & imaging results  that were available during my care of the patient were reviewed by me and considered in my medical decision making (see chart for details).    MDM Rules/Calculators/A&P                         Diagnosis for this foot/ ankle pain includes gout, pseudogout, arthritis, septic joint,  fracture, tendinopathy.  Patient with significant tenderness to palpation of the lateral right foot and right ankle, and pain with range of motion of the right ankle.  Ibuprofen offered in the ED for pain.  No systemic symptoms or fevers, vital signs are normal.  X-ray of the right foot negative for anterior changes, but without any acute abnormality.  Given history of sudden onset of unprovoked right foot pain, in the context of signs and absence of systemic infectious symptoms, I doubt infectious joint.  No further work-up in the emergency department necessary at this time.  I will treat this patient for possible gout of her right foot and ankle.  She will be discharged with prescription for colchicine, and short course of prednisone as this patient is not diabetic.  She should stay off of her food for the next few days.  We will provide her with a work note.  She was offered crutches to go home with.   Although should follow-up with her primary care provider in 1 week for reexamination of her foot.  Strict return precautions were given to the patient.  Care of this patient was staffed with attending physician Dr. Sabra Heck. Sholonda voiced understanding of our medical assessment, and her treatment plan.  Each of her questions were answered to her expressed satisfaction.   Final Clinical Impression(s) / ED Diagnoses Final diagnoses:  Foot pain, right    Rx / DC  Orders ED Discharge Orders         Ordered    colchicine 0.6 MG tablet  Daily        09/10/20 1150    predniSONE (DELTASONE) 10 MG tablet  Daily        09/10/20 1150           Jaquae Rieves, Sharlene Dory 09/10/20 1319    Noemi Chapel,  MD 09/16/20 [phone]

## 2020-09-10 NOTE — Discharge Instructions (Addendum)
Emergency department today for pain of your right foot, there was no fracture of any of the bones on your x-ray.  Does appear that you may have an inflammatory condition called the Gout.   Been prescribed a medication called colchicine; this medication is an anti-inflammatory medication.  You should NOT take medication such as ibuprofen or naproxen in addition to colchicine.  Additionally you have been prescribed short course of steroids to decrease the inflammation.  Please follow-up with your primary care physician this week for reexamination of your throat.  You should return the emergency department should you develop redness of your foot, warmth to the touch, extension of pain further up your leg or down the foot, signs of infection.

## 2020-09-26 ENCOUNTER — Ambulatory Visit (INDEPENDENT_AMBULATORY_CARE_PROVIDER_SITE_OTHER): Payer: Medicare Other | Admitting: Internal Medicine

## 2020-09-26 ENCOUNTER — Other Ambulatory Visit: Payer: Self-pay

## 2020-09-26 ENCOUNTER — Encounter: Payer: Self-pay | Admitting: Internal Medicine

## 2020-09-26 DIAGNOSIS — J449 Chronic obstructive pulmonary disease, unspecified: Secondary | ICD-10-CM | POA: Diagnosis not present

## 2020-09-26 DIAGNOSIS — F17210 Nicotine dependence, cigarettes, uncomplicated: Secondary | ICD-10-CM | POA: Diagnosis not present

## 2020-09-26 DIAGNOSIS — J9611 Chronic respiratory failure with hypoxia: Secondary | ICD-10-CM

## 2020-09-26 LAB — PULMONARY FUNCTION TEST
DL/VA % pred: 67 %
DL/VA: 2.84 ml/min/mmHg/L
DLCO cor % pred: 38 %
DLCO cor: 7.67 ml/min/mmHg
DLCO unc % pred: 38 %
DLCO unc: 7.67 ml/min/mmHg
FEF 25-75 Post: 0.29 L/sec
FEF 25-75 Pre: 0.32 L/sec
FEF2575-%Change-Post: -11 %
FEF2575-%Pred-Post: 14 %
FEF2575-%Pred-Pre: 15 %
FEV1-%Change-Post: -4 %
FEV1-%Pred-Post: 36 %
FEV1-%Pred-Pre: 38 %
FEV1-Post: 0.75 L
FEV1-Pre: 0.79 L
FEV1FVC-%Change-Post: -2 %
FEV1FVC-%Pred-Pre: 61 %
FEV6-%Change-Post: -3 %
FEV6-%Pred-Post: 61 %
FEV6-%Pred-Pre: 63 %
FEV6-Post: 1.56 L
FEV6-Pre: 1.61 L
FEV6FVC-%Change-Post: -1 %
FEV6FVC-%Pred-Post: 101 %
FEV6FVC-%Pred-Pre: 102 %
FVC-%Change-Post: -2 %
FVC-%Pred-Post: 60 %
FVC-%Pred-Pre: 61 %
FVC-Post: 1.58 L
FVC-Pre: 1.62 L
Post FEV1/FVC ratio: 48 %
Post FEV6/FVC ratio: 98 %
Pre FEV1/FVC ratio: 49 %
Pre FEV6/FVC Ratio: 99 %
RV % pred: 177 %
RV: 3.57 L
TLC % pred: 105 %
TLC: 5.31 L

## 2020-09-26 NOTE — Assessment & Plan Note (Addendum)

## 2020-09-26 NOTE — Progress Notes (Signed)
[name] [name], female    DOB: Dec 29, 1960,    MRN: [id number]   Synopsis: First referred in 2017 for evaluation of COPD/ active smoker 2016 pulmonary function testing from [facility] showed FEV1 of 870 mL (37% predicted) consistent with severe airflow obstruction 2015 CT scan from [facility] showed severe panlobular emphysema She is prescribed 2 L O2 qHS       06/10/2018  Acute extended  ov/Glayds [place] re: post ER eval  06/08/18 dx aecopd/bronchitis rx zpak  / pt with GOLD III criteria / still smoking Chief Complaint  Patient presents with  . Acute Visit    hemoptysis since 06/08/18.    Dyspnea:  MMRC2 = can't walk a nl pace on a flat grade s sob but does fine slow and flat does [employer] 0k s 02 and that hasn't changed since onset of hemoptysis Cough: acute onset  on 06/08/18 mostly mucoid with streaky hemoptysis assoc with lots of nasal congestion but no purulent / bloody d/c  SABA use: has it and says helps but hfa at 0% effective baseline technique rec Prednisone 10 mg take  4 each am x 2 days,   2 each am x 2 days,  1 each am x 2 days and stop  Finish your antibiotics  Try change spiriva to the respimat 1.25  X 4 pffs each am - no change on serevent for now  Work on inhaler technique: e only use your albuterol (PROVENTIL)  as a rescue medication   05/25/2020  Acute extended ov/Idonia Zollinger re: cough / still smoking / maint on serevent / spriva smi and s/p 2nd pfizer vaccine 04/10/20  Chief Complaint  Patient presents with  . Acute Visit    COPD, more coughing 1 month  Dyspnea:  Worse with cough x 1 month Cough: slt green with lump in throat "all the time  Sleeping: occ wakes up  SABA use: avg maybe 2-3 per week  02: 2lpm rec Stop serevent and spiriva and start stiolto 2 puffs each am  Prednisone 10 mg take  4 each am x 2 days,   2 each am x 2 days,  1 each am x 2 days and stop  zpak  Pantoprazole (protonix) 40 mg   Take  30-60 min before first meal of the day and Pepcid (famotidine)  20 mg one after  supper  until return to office  The key is to stop smoking completely before smoking completely stops you!   NP recs 06/13/20 Continue Stiolto 2 puffs once daily in the morning Start taking mucinex twice daily for 2 weeks  Use 1-2L oxygen as needed on exertion to keep O2 level > 88-90% Strongly encourage you quit smoking- recommend you taper down amount and pick a quit date GERD: Stop Protonix Continue Pepcid at bedtime       09/26/2020  f/u ov/Jensyn Cambria re:   Back on serevent and spiriva "stiolto stopped workingTour manager Complaint  Patient presents with  . Follow-up    denies any issues with breathing   Dyspnea: [employer] does fine  Cough: min am mucoid  Sleeping: no respiratory complaints flat bed/ one pillow  SABA use: 2 x weekly 02: 2lpm at bedtime / no portable    No obvious day to day or daytime variability or assoc excess/ purulent sputum or mucus plugs or hemoptysis or cp or chest tightness, subjective wheeze or overt sinus or hb symptoms.   Sleeping without nocturnal   exacerbation  of respiratory  c/o's or need for noct saba. Also denies any obvious fluctuation of symptoms with weather or environmental changes or other aggravating or alleviating factors except as outlined above   No unusual exposure hx or h/o childhood pna/ asthma or knowledge of premature birth.  Current Allergies, Complete Past Medical History, Past Surgical History, Family History, and Social History were reviewed in [employer] record.  ROS  The following are not active complaints unless bolded Hoarseness, sore throat, dysphagia, dental problems, itching, sneezing,  nasal congestion or discharge of excess mucus or purulent secretions, ear ache,   fever, chills, sweats, unintended wt loss or wt gain, classically pleuritic or exertional cp,  orthopnea pnd or arm/hand swelling  or leg swelling, presyncope, palpitations, abdominal pain, anorexia, nausea, vomiting, diarrhea  or change in  bowel habits or change in bladder habits, change in stools or change in urine, dysuria, hematuria,  rash, arthralgias, visual complaints, headache, numbness, weakness or ataxia or problems with walking or coordination,  change in mood or  memory.        Current Meds  Medication Sig  . albuterol (PROVENTIL) (2.5 MG/3ML) 0.083% nebulizer solution Take 3 mLs (2.5 mg total) by nebulization every 6 (six) hours as needed for wheezing or shortness of breath.  Marland Kitchen albuterol (VENTOLIN HFA) 108 (90 Base) MCG/ACT inhaler Inhale 2 puffs into the lungs every 6 (six) hours as needed for wheezing or shortness of breath.  Marland Kitchen amLODipine (NORVASC) 5 MG tablet Take 5 mg by mouth daily.  Marland Kitchen buPROPion (WELLBUTRIN SR) 100 MG 12 hr tablet Take 100 mg by mouth 2 (two) times daily.    . Multiple Vitamin (MULTIVITAMIN) capsule Take 1 capsule by mouth daily.  . OXYGEN Inhale 2 L into the lungs as needed (for shortness of breath).   . raloxifene (EVISTA) 60 MG tablet Take 60 mg by mouth daily.    . solifenacin (VESICARE) 5 MG tablet Take 5 mg by mouth daily.  . Tetrahydrozoline HCl (VISINE OP) Place 1 drop into both eyes as needed (for dry eyes).  . Tiotropium Bromide-Olodaterol (STIOLTO RESPIMAT) 2.5-2.5 MCG/ACT AERS Inhale 2 puffs into the lungs daily.             Objective:         09/26/2020     132 05/25/2020       131  06/10/18 129 lb 9.6 oz (58.8 kg)  03/17/18 133 lb 12.8 oz (60.7 kg)  01/08/18 138 lb (62.6 kg)     Vital signs reviewed  09/26/2020  - Note at rest 02 sats  92% on RA    HEENT : pt wearing mask not removed for exam due to covid -19 concerns.    NECK :  without JVD/Nodes/TM/ nl carotid upstrokes bilaterally   LUNGS: no acc muscle use,  Mod barrel  contour chest wall with bilateral  Distant bs s audible wheeze and  without cough on insp or exp maneuvers and mod  Hyperresonant  to  percussion bilaterally     CV:  RRR  no s3 or murmur or increase in P2, and no edema   ABD:  soft and  nontender with pos mid insp Hoover's  in the supine position. No bruits or organomegaly appreciated, bowel sounds nl  MS:     ext warm without deformities, calf tenderness, cyanosis or clubbing No obvious joint restrictions   SKIN: warm and dry without lesions    NEURO:  alert, approp, nl sensorium with  no motor  or cerebellar deficits apparent.       .       Assessment

## 2020-09-26 NOTE — Patient Instructions (Addendum)
Plan A = Automatic = Always=    serevent twice daily and spiriva 1.25 2puff each a   Plan B = Backup (to supplement plan A, not to replace it) Only use your albuterol inhaler  as a rescue medication to be used if you can't catch your breath by resting or doing a relaxed purse lip breathing pattern.  - The less you use it, the better it will work when you need it. - Ok to use the inhaler up to 2 puffs  every 4 hours if you must but call for appointment if use goes up over your usual need - Don't leave home without it !!  (think of it like the spare tire for your car)   Plan C = Crisis (instead of Plan B but only if Plan B stops working) - only use your albuterol nebulizer if you first try Plan B and it fails to help > ok to use the nebulizer up to every 4 hours but if start needing it regularly call for immediate appointment  Plan D = Doctor - call me if ABC not working   The key is to stop smoking completely before smoking completely stops you!   If you are satisfied with your treatment plan,  let your doctor know and he/she can either refill your medications or you can return here when your prescription runs out.     If in any way you are not 100% satisfied,  please tell us.  If 100% better, tell your friends!  Pulmonary follow up is as needed

## 2020-09-26 NOTE — Assessment & Plan Note (Addendum)
Active smoker  - PFT's  04/08/2006  FEV1 1.32 (51 % ) ratio 0.53  p 8 % improvement from saba p ? prior to study with   A  FV curve classic moderate concavity   06/10/2018  After extensive coaching inhaler device  effectiveness =   50% from a baseline of 0   - 05/25/2020  After extensive coaching inhaler device,  effectiveness =    75% (poor insp flow, short Ti) > try stiolto 2 puffs each am  > preferred spiriva 1.25/ serevent  - PFT's  09/26/2020  FEV1 0.79 (38 % ) ratio 0.49  p 0 % improvement from saba p servent/spiriva 1.25 (very last dose in device) prior to study with DLCO  7.67 (38%) corrects to 2.84 (66%)  for alv volume and FV curve classically concave exp loop   Pt is Group B in terms of symptom/risk and laba/lama therefore appropriate rx at this point >>>  Continue serevent one bid and spiriva 1.25 mg 2 qam but if worse would rechallenge with spiriva 2.5 (that was the dose in the stiolto she did not think was helping her with vague complaints about what she meant but I suppose could have meant too much M3 blockade)   Re saba: I spent extra time with pt today reviewing appropriate use of albuterol for prn use on exertion with the following points: 1) saba is for relief of sob that does not improve by walking a slower pace or resting but rather if the pt does not improve after trying this first. 2) If the pt is convinced, as many are, that saba helps recover from activity faster then it's easy to tell if this is the case by re-challenging : ie stop, take the inhaler, then p 5 minutes try the exact same activity (intensity of workload) that just caused the symptoms and see if they are substantially diminished or not after saba 3) if there is an activity that reproducibly causes the symptoms, try the saba 15 min before the activity on alternate days   If in fact the saba really does help, then fine to continue to use it prn but advised may need to look closer at the maintenance regimen being used  to achieve better control of airways disease with exertion.

## 2020-09-26 NOTE — Progress Notes (Signed)
Full PFT performed today. °

## 2020-09-26 NOTE — Assessment & Plan Note (Signed)
On 2lpm hs s portable 02 as of 09/26/2020   Advised:  Make sure you check your oxygen saturations at highest level of activity to be sure it stays over 90% and keep track of it at least once a week, more often if breathing getting worse, and let me know if losing ground.          Each maintenance medication was reviewed in detail including emphasizing most importantly the difference between maintenance and prns and under what circumstances the prns are to be triggered using an action plan format where appropriate.  Total time for H and P, chart review, counseling, teaching device and generating customized AVS unique to this office visit / charting =  32 min

## 2020-10-24 ENCOUNTER — Telehealth: Payer: Self-pay | Admitting: Internal Medicine

## 2020-10-24 NOTE — Telephone Encounter (Signed)
Spoke with pt, states that she was dx'ed X2 days ago with covid-19.  Pt was advised by health dept to contact our office for abx and further recs.   Pt c/o dyspnea, loss of taste, prod cough with yellow mucus, watery eyes.  Denies fever, chills, chest pain.  Has not been taking anything to help with s/s.    Pharmacy: [facility] [address]   MW please advise on recs.  Thanks!

## 2020-10-24 NOTE — Telephone Encounter (Signed)
Pt is calling back - I let her knwo rthat we called and she states that she always has VM - verified the # it is correct - please call

## 2020-10-24 NOTE — Telephone Encounter (Signed)
Please refer her to the monoclonal antibody service

## 2020-10-24 NOTE — Telephone Encounter (Signed)
Sent email for referral.  Pt aware.  Nothing further needed at this time- will close encounter.

## 2020-10-24 NOTE — Telephone Encounter (Signed)
ATC patient--no answer with no option to leave vm. Line rang for >1min.  Will call back.  

## 2020-10-25 ENCOUNTER — Other Ambulatory Visit: Payer: Self-pay | Admitting: Nurse Practitioner

## 2020-10-25 ENCOUNTER — Telehealth: Payer: Self-pay

## 2020-10-25 ENCOUNTER — Telehealth: Payer: Self-pay | Admitting: Nurse Practitioner

## 2020-10-25 DIAGNOSIS — J449 Chronic obstructive pulmonary disease, unspecified: Secondary | ICD-10-CM

## 2020-10-25 DIAGNOSIS — J9611 Chronic respiratory failure with hypoxia: Secondary | ICD-10-CM

## 2020-10-25 DIAGNOSIS — U071 COVID-19: Secondary | ICD-10-CM | POA: Insufficient documentation

## 2020-10-25 DIAGNOSIS — J439 Emphysema, unspecified: Secondary | ICD-10-CM

## 2020-10-25 MED ORDER — PREDNISONE 10 MG PO TABS: ORAL_TABLET | ORAL | 0 refills | Status: DC

## 2020-10-25 MED ORDER — AZITHROMYCIN 250 MG PO TABS: ORAL_TABLET | ORAL | 0 refills | Status: DC

## 2020-10-25 NOTE — Progress Notes (Signed)
Called to discuss with [name] [name] about Covid symptoms and the use a monoclonal antibody infusion for those with mild to moderate Covid symptoms and at a high risk of hospitalization.     Unfortunately, pt does not qualify for infusion therapy as her symptoms first presented > 10 days prior to timing of infusion (Symptom onset 10/26).   Symptoms tier reviewed as well as criteria for ending isolation. Preventative practices reviewed. Patient verbalized understanding.  Encouraged patient to contact her PCP or pulmonologist for ongoing cough and respiratory symptoms as this could possibly indicate the presence of secondary infection. She is still exhibiting shortness of breath with exertionand ongoing cough, both worse than baseline. She may benefit from in person examination and chest x-ray to rule out pneumonia.   Discussed the importance of follow-up for worsening symptoms, increased oxygen use, or symptoms that would indicate decreased oxygenation. She expressed understanding.    Patient Active Problem List   Diagnosis Date Noted  . COPD  GOLD 3  06/11/2018  . Protein calorie malnutrition ([name]) 05/21/2017  . Chronic respiratory failure with hypoxia ([name]) 05/21/2017  . Abnormal CT scan 12/11/2016  . Cigarette smoker 02/06/2016  . COPD with emphysema ([name]) 02/06/2016  . OBSTRUCTIVE CHRONIC BRONCHITIS WITHOUT EXACERBAT 12/01/2008  . 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 DEPRESSION 04/05/2008  . RESTRICTIVE LUNG DISEASE 04/05/2008  . GERD 04/05/2008  . DIVERTICULOSIS, COLON 04/05/2008  . IRRITABLE BOWEL SYNDROME 04/05/2008  . HEMORRHOIDS, HX OF 04/05/2008    [name], DNP, AGNP-c COVID-19 [facility] [phone]

## 2020-10-25 NOTE — Telephone Encounter (Signed)
Called and spoke with pt letting her know the info stated by MW that we were going to send in zpak abx and pred taper to the pharmacy for her to see if this would help with her symptoms. Pt verbalized understanding. Verified preferred pharmacy and sent Rx in for pt. Nothing further needed.

## 2020-10-25 NOTE — Telephone Encounter (Signed)
COVID [facility] Contact Note   Qualifiers: COPD, O2 dependent at baseline, HTN Symptoms: Unknown Symptom Onset: Unknown   Contact via telephone to discuss symptoms and evaluate for interest and qualifications for [facility] treatment. Patient qualifies for at-risk group according to the FDA Emergency Use Authorization based on qualifiers listed above.   Unable to reach patient by phone this morning. VM left with reason for call and call back information for [facility] hotline at [phone].   [name], DNP, AGNP-c COVID-[facility] [phone]

## 2020-10-25 NOTE — Progress Notes (Signed)
Zpak/ Prednisone 10 mg take  4 each am x 2 days,   2 each am x 2 days,  1 each am x 2 days and stop  

## 2020-10-25 NOTE — Telephone Encounter (Signed)
Please see 'orders only' encounter from 10/25/2020. Created new encounter, as I was unable to document within orders only encounter.   Tanda Rockers, MD  [facility] Triage Pool 3 hours ago (10:58 AM)     Zpak Prednisone 10 mg take  4 each am x 2 days,   2 each am x 2 days,  1 each am x 2 days and stop       Lm for patient.

## 2020-10-26 ENCOUNTER — Other Ambulatory Visit: Payer: Self-pay | Admitting: Internal Medicine

## 2020-10-26 DIAGNOSIS — Z1231 Encounter for screening mammogram for malignant neoplasm of breast: Secondary | ICD-10-CM

## 2020-10-31 ENCOUNTER — Other Ambulatory Visit: Payer: Self-pay | Admitting: Internal Medicine

## 2020-12-20 ENCOUNTER — Other Ambulatory Visit: Payer: Self-pay | Admitting: Primary Care

## 2020-12-26 ENCOUNTER — Ambulatory Visit: Payer: Medicare Other

## 2021-01-02 ENCOUNTER — Ambulatory Visit: Payer: Medicare Other

## 2021-01-23 ENCOUNTER — Other Ambulatory Visit: Payer: Self-pay

## 2021-01-23 ENCOUNTER — Emergency Department (HOSPITAL_COMMUNITY): Payer: Medicare Other

## 2021-01-23 ENCOUNTER — Emergency Department (HOSPITAL_COMMUNITY)
Admission: EM | Admit: 2021-01-23 | Discharge: 2021-01-23 | Disposition: A | Discharge status: Home / Self Care | Payer: Medicare Other | Attending: Emergency Medicine | Admitting: Emergency Medicine

## 2021-01-23 ENCOUNTER — Encounter (HOSPITAL_COMMUNITY): Payer: Self-pay | Admitting: *Deleted

## 2021-01-23 DIAGNOSIS — X58XXXA Exposure to other specified factors, initial encounter: Secondary | ICD-10-CM | POA: Insufficient documentation

## 2021-01-23 DIAGNOSIS — R109 Unspecified abdominal pain: Secondary | ICD-10-CM | POA: Diagnosis not present

## 2021-01-23 DIAGNOSIS — S2231XA Fracture of one rib, right side, initial encounter for closed fracture: Secondary | ICD-10-CM | POA: Diagnosis not present

## 2021-01-23 DIAGNOSIS — F17210 Nicotine dependence, cigarettes, uncomplicated: Secondary | ICD-10-CM | POA: Diagnosis not present

## 2021-01-23 DIAGNOSIS — J449 Chronic obstructive pulmonary disease, unspecified: Secondary | ICD-10-CM | POA: Insufficient documentation

## 2021-01-23 DIAGNOSIS — S299XXA Unspecified injury of thorax, initial encounter: Secondary | ICD-10-CM | POA: Diagnosis present

## 2021-01-23 DIAGNOSIS — Y9301 Activity, walking, marching and hiking: Secondary | ICD-10-CM | POA: Diagnosis not present

## 2021-01-23 DIAGNOSIS — Z8616 Personal history of COVID-19: Secondary | ICD-10-CM | POA: Diagnosis not present

## 2021-01-23 MED ORDER — LIDOCAINE 5 % EX PTCH
2.0000 | MEDICATED_PATCH | Freq: Once | CUTANEOUS | Status: DC
  Administered 2021-01-23: 2 via TRANSDERMAL
  Filled 2021-01-23: qty 2

## 2021-01-23 MED ORDER — ACETAMINOPHEN 500 MG PO TABS
1000.0000 mg | ORAL_TABLET | Freq: Once | ORAL | Status: AC
  Administered 2021-01-23: 1000 mg via ORAL
  Filled 2021-01-23: qty 2

## 2021-01-23 MED ORDER — METHOCARBAMOL 500 MG PO TABS
1000.0000 mg | ORAL_TABLET | Freq: Once | ORAL | Status: AC
  Administered 2021-01-23: 1000 mg via ORAL
  Filled 2021-01-23: qty 2

## 2021-01-23 MED ORDER — HYDROCODONE-ACETAMINOPHEN 5-325 MG PO TABS
1.0000 | ORAL_TABLET | Freq: Four times a day (QID) | ORAL | 0 refills | Status: DC | PRN
Start: 2021-01-23 — End: 2021-06-07

## 2021-01-23 MED ORDER — METHOCARBAMOL 500 MG PO TABS: 500.0000 mg | ORAL_TABLET | Freq: Two times a day (BID) | ORAL | 0 refills | Status: DC

## 2021-01-23 MED ORDER — OXYCODONE-ACETAMINOPHEN 5-325 MG PO TABS
1.0000 | ORAL_TABLET | ORAL | Status: DC | PRN
  Administered 2021-01-23: 1 via ORAL
  Filled 2021-01-23: qty 1

## 2021-01-23 NOTE — ED Provider Notes (Signed)
[facility] EMERGENCY DEPARTMENT Provider Note   CSN: [id number] Arrival date & time: 01/23/21  1634     History Chief Complaint  Patient presents with  . Back Pain    [name] [name] is a 60 y.o. female.  [name] [name] is a 60 y.o. female with history of COPD, osteoporosis, anemia, arthritis, anxiety, who presents to the emergency department for evaluation of back and flank pain.  She reports that earlier today she started to stumble while walking up some stairs, caught herself and did not fall to the ground but since this has had worsening pain over the right side of her back and right lateral ribs.  She reports she did not hit this area on anything but thinks she moved her body in an odd way to keep herself from falling and may have strained or injured something.  She reports pain is worse with movement and palpation.  She did not take anything at home prior to arrival, was given a Percocet in the waiting room with some improvement.  She reports sometimes pain radiates into her left leg a bit but she denies any numbness, tingling or weakness in her extremities.  Did not hit her head.  No anterior chest pain or abdominal pain.  Pain starts in the mid back and goes down the right side of the back, but wraps around onto the right lateral ribs.  No other aggravating or alleviating factors.        Past Medical History:  Diagnosis Date  . Anemia   . Anxiety   . Arthritis    OSTEOARTHRITIS  . Blood dyscrasia    EASY BLEEDER  . Colon polyps   . COPD (chronic obstructive pulmonary disease) ([place])   . Depression   . Osteoporosis   . Seasonal allergies   . Sickle cell trait ([place])   . Uterine fibroid     Patient Active Problem List   Diagnosis Date Noted  . COVID-19 10/25/2020  . COPD  GOLD 3  06/11/2018  . Protein calorie malnutrition ([place]) 05/21/2017  . Chronic respiratory failure with hypoxia ([place]) 05/21/2017  . Abnormal CT scan 12/11/2016  . Cigarette smoker  02/06/2016  . COPD with emphysema ([place]) 02/06/2016  . OBSTRUCTIVE CHRONIC BRONCHITIS WITHOUT EXACERBAT 12/01/2008  . 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 DEPRESSION 04/05/2008  . RESTRICTIVE LUNG DISEASE 04/05/2008  . GERD 04/05/2008  . DIVERTICULOSIS, COLON 04/05/2008  . IRRITABLE BOWEL SYNDROME 04/05/2008  . HEMORRHOIDS, HX OF 04/05/2008    Past Surgical History:  Procedure Laterality Date  . CESAREAN SECTION  1980  . DILATATION & CURETTAGE/HYSTEROSCOPY WITH MYOSURE N/A 09/30/2017   Procedure: DILATATION & CURETTAGE/HYSTEROSCOPY WITH MYOSURE MYOMECTOMY;  Surgeon: Thurnell Lose, MD;  Location: [facility];  Service: Gynecology;  Laterality: N/A;  PMB     OB History   No obstetric history on file.     Family History  Problem Relation Age of Onset  . Stomach cancer Father 32  . Throat cancer Brother   . Heart disease Mother   . Breast cancer Neg Hx     Social History   Tobacco Use  . Smoking status: Current Some Day Smoker    Packs/day: 0.50    Years: 42.00    Pack years: 21.00    Types: Cigarettes  . Smokeless tobacco: Never Used  Vaping Use  . Vaping Use: Never used  Substance Use Topics  . Alcohol use: Yes    Alcohol/week: 0.0 standard drinks  Comment: occasional  . Drug use: No    Home Medications Prior to Admission medications   Medication Sig Start Date End Date Taking? Authorizing Provider  HYDROcodone-acetaminophen (NORCO) 5-325 MG tablet Take 1 tablet by mouth every 6 (six) hours as needed. 01/23/21  Yes Jacqlyn Larsen, PA-C  methocarbamol (ROBAXIN) 500 MG tablet Take 1 tablet (500 mg total) by mouth 2 (two) times daily. 01/23/21  Yes Jacqlyn Larsen, PA-C  albuterol (PROVENTIL) (2.5 MG/3ML) 0.083% nebulizer solution Take 3 mLs (2.5 mg total) by nebulization every 6 (six) hours as needed for wheezing or shortness of breath. 02/06/16   Juanito Doom, MD  albuterol (VENTOLIN HFA) 108 (90 Base) MCG/ACT inhaler Inhale 2 puffs into the lungs every 6 (six) hours as  needed for wheezing or shortness of breath. 06/13/20   Martyn Ehrich, NP  amLODipine (NORVASC) 5 MG tablet Take 5 mg by mouth daily. 05/11/20   [provider]  azithromycin (ZITHROMAX) 250 MG tablet Take two today and then one daily until finished. 10/25/20   Tanda Rockers, MD  buPROPion (WELLBUTRIN SR) 100 MG 12 hr tablet Take 100 mg by mouth 2 (two) times daily.      [provider]  colchicine 0.6 MG tablet Take 1 tablet (0.6 mg total) by mouth daily. Take 2 tablets today as soon as you pick up the prescription.  And then 1 more tablet tonight before bed.  If your pain is persisting when you return tomorrow morning continue to take 1 tablet in the morning and 1 at night until your pain resolves. Patient not taking: Reported on 09/26/2020 09/10/20   Sponseller, Gypsy Balsam, PA-C  famotidine (PEPCID) 20 MG tablet One after supper Patient not taking: Reported on 09/26/2020 05/25/20   Tanda Rockers, MD  Multiple Vitamin (MULTIVITAMIN) capsule Take 1 capsule by mouth daily.    [provider]  OXYGEN Inhale 2 L into the lungs as needed (for shortness of breath).     [provider]  predniSONE (DELTASONE) 10 MG tablet Take 4tabs x2days, 2tabsx2days, 1tabx2days, then stop 10/25/20   Tanda Rockers, MD  raloxifene (EVISTA) 60 MG tablet Take 60 mg by mouth daily.      [provider]  solifenacin (VESICARE) 5 MG tablet Take 5 mg by mouth daily. 05/11/20   [provider]  SPIRIVA RESPIMAT 1.25 MCG/ACT AERS INHALE 2 PUFFS INTO THE LUNGS DAILY 12/20/20   Tanda Rockers, MD  STIOLTO RESPIMAT 2.5-2.5 MCG/ACT AERS INHALE 2 PUFFS INTO THE LUNGS DAILY 10/31/20   Tanda Rockers, MD  Tetrahydrozoline HCl (VISINE OP) Place 1 drop into both eyes as needed (for dry eyes).    [provider]    Allergies    Patient has no known allergies.  Review of Systems   Review of Systems  Constitutional: Negative for chills and fever.  Respiratory:  Negative for cough and shortness of breath.   Cardiovascular: Negative for chest pain.  Gastrointestinal: Negative for abdominal pain, nausea and vomiting.  Genitourinary: Positive for flank pain. Negative for dysuria, frequency and hematuria.  Musculoskeletal: Positive for back pain and myalgias. Negative for arthralgias and neck pain.  Skin: Negative for color change and rash.  Neurological: Negative for weakness, numbness and headaches.  All other systems reviewed and are negative.   Physical Exam Updated Vital Signs BP 116/79 (BP Location: Right Arm)   Pulse 95   Temp 98.9 F (37.2 C) (Oral)   Resp 20  Ht 5\' 4"  (1.626 m)   Wt 59 kg   SpO2 90%   BMI 22.31 kg/m   Physical Exam Vitals and nursing note reviewed.  Constitutional:      General: She is not in acute distress.    Appearance: Normal appearance. She is well-developed and well-nourished. She is not ill-appearing or diaphoretic.  HENT:     Head: Normocephalic and atraumatic.     Mouth/Throat:     Mouth: Oropharynx is clear and moist.  Eyes:     General:        Right eye: No discharge.        Left eye: No discharge.     Extraocular Movements: EOM normal.  Cardiovascular:     Rate and Rhythm: Normal rate and regular rhythm.     Pulses: Normal pulses and intact distal pulses.     Heart sounds: Normal heart sounds. No murmur heard. No friction rub. No gallop.   Pulmonary:     Effort: Pulmonary effort is normal. No respiratory distress.     Breath sounds: Normal breath sounds. No wheezing or rales.     Comments: Respirations equal and unlabored, patient able to speak in full sentences, lungs clear to auscultation bilaterally, there is tenderness over the right lateral ribs without overlying skin changes or palpable deformity, no crepitus Chest:     Chest wall: Tenderness present.  Abdominal:     General: Bowel sounds are normal. There is no distension.     Palpations: Abdomen is soft. There is no mass.      Tenderness: There is no abdominal tenderness. There is no guarding.     Comments: Abdomen soft, nondistended, nontender to palpation in all quadrants without guarding or peritoneal signs   Musculoskeletal:        General: No deformity or edema.     Cervical back: Neck supple.     Comments: Tenderness over the right back musculature starting in the mid thoracic back and extending down to the lumbar spine. There is some mild midline and paraspinal tenderness without palpable step-off or deformity. No left-sided back tenderness. No focal pain or tenderness over the extremities.  Skin:    General: Skin is warm and dry.     Capillary Refill: Capillary refill takes less than 2 seconds.  Neurological:     Mental Status: She is alert and oriented to person, place, and time.     Coordination: Coordination normal.     Comments: Speech is clear, able to follow commands CN III-XII intact Normal strength in upper and lower extremities bilaterally including dorsiflexion and plantar flexion, strong and equal grip strength Sensation normal to light and sharp touch Moves extremities without ataxia, coordination intact  Psychiatric:        Mood and Affect: Mood normal.        Behavior: Behavior normal.     ED Results / Procedures / Treatments   Labs (all labs ordered are listed, but only abnormal results are displayed) Labs Reviewed - No data to display  EKG None  Radiology DG Ribs Unilateral W/Chest Right  Result Date: 01/23/2021 CLINICAL DATA:  Back and flank pain.  Right-sided chest pain. EXAM: RIGHT RIBS AND CHEST - 3+ VIEW COMPARISON:  Chest radiograph dated 05/25/2020 FINDINGS: Emphysema with bibasilar atelectasis/scarring. No lobar consolidation, pleural effusion, or pneumothorax. The cardiac silhouette is within limits. Degenerative changes of the spine. Nondisplaced fracture of the posterior right fifth rib. IMPRESSION: Nondisplaced fracture of the posterior right fifth rib.  No pneumothorax.  Electronically Signed   By: Anner Crete M.D.   On: 01/23/2021 19:16   DG Thoracic Spine 2 View  Result Date: 01/23/2021 CLINICAL DATA:  Back pain EXAM: THORACIC SPINE 2 VIEWS COMPARISON:  None. FINDINGS: Mild rightward scoliosis centered in the midthoracic spine. Diffuse osteopenia. Mild degenerative spurring diffusely. No fracture or focal bone lesion. IMPRESSION: No acute bony abnormality. Electronically Signed   By: Rolm Baptise M.D.   On: 01/23/2021 19:08   DG Lumbar Spine Complete  Result Date: 01/23/2021 CLINICAL DATA:  Back and flank pain EXAM: LUMBAR SPINE - COMPLETE 4+ VIEW COMPARISON:  None. FINDINGS: Normal alignment. Diffuse degenerative facet disease. Disc spaces maintained. Early anterior degenerative spurring. No fracture. SI joints symmetric and unremarkable. IMPRESSION: Degenerative changes as above.  No acute bony abnormality. Electronically Signed   By: Rolm Baptise M.D.   On: 01/23/2021 19:20    Procedures Procedures   Medications Ordered in ED Medications  oxyCODONE-acetaminophen (PERCOCET/ROXICET) 5-325 MG per tablet 1 tablet (1 tablet Oral Given 01/23/21 1720)  lidocaine (LIDODERM) 5 % 2 patch (2 patches Transdermal Patch Applied 01/23/21 1857)  acetaminophen (TYLENOL) tablet 1,000 mg (1,000 mg Oral Given 01/23/21 1857)  methocarbamol (ROBAXIN) tablet 1,000 mg (1,000 mg Oral Given 01/23/21 1857)    ED Course  I have reviewed the triage vital signs and the nursing notes.  Pertinent labs & imaging results that were available during my care of the patient were reviewed by me and considered in my medical decision making (see chart for details).    MDM Rules/Calculators/A&P                         60 year old female presents with pain in the right back and side after she stumbled going up stairs and caught herself before falling and then felt like she injured something. Pain primarily over the mid to low back, but pain wraps around on the right lateral ribs. No associated  chest pain or abdominal pain. No numbness weakness or tingling in the extremities. Did not fall to the ground, no head injury. Will get plain films of the right ribs and chest, thoracic and lumbar spine. Pain treated here in the ED.  I have reviewed x-rays, agree with radiologist findings, patient with history of osteoporosis, and has a nondisplaced fracture of the posterior right fifth rib, no associated pneumothorax and no other rib fractures noted. There is some degenerative changes of the spine but no focal fracture or bony abnormality noted in the thoracic or lumbar spine.  Discussed x-ray findings with patient. Discussed management of rib fracture involving pain control and incentive spirometry. Will prescribe Norco and Robaxin, [place] narcotic database reviewed. Discussed precautions with these medications. Also encouraged use of NSAIDs or Tylenol, lidocaine patches, ice and heat. PCP follow-up encouraged and return precautions discussed. Patient expresses understanding and agreement with plan. Discharged home in good condition.   Final Clinical Impression(s) / ED Diagnoses Final diagnoses:  Closed fracture of one rib of right side, initial encounter    Rx / DC Orders ED Discharge Orders         Ordered    HYDROcodone-acetaminophen (NORCO) 5-325 MG tablet  Every 6 hours PRN        01/23/21 2032    methocarbamol (ROBAXIN) 500 MG tablet  2 times daily        01/23/21 2032           Jacqlyn Larsen, [place]  01/24/21 1445    Valarie Merino, MD 01/25/21 1032

## 2021-01-23 NOTE — ED Triage Notes (Signed)
Pt reports stumbling when walking up stairs, pulled something in her back. Has upper back pain that radiates around to her right side and pain into her left leg. Denies hx of back problems.

## 2021-01-23 NOTE — Discharge Instructions (Signed)
Your x-ray showed that you have a rib fracture on the right, this is likely the cause of your pain.  These heal on their own, it does take time.  You can use ibuprofen or Tylenol every 6 hours for pain.  Use prescribed Norco for breakthrough pain, this is a narcotic pain medication, can cause drowsiness, please use with caution.  You can also use Robaxin for muscle spasms, this can also cause some drowsiness.  You can also apply lidocaine patches, ice or heat to the area.  Please use incentive spirometer several times a day to help encourage deep breaths and prevent pneumonia.  Follow-up closely with your PCP.  If you develop fevers, worsening pain, shortness of breath or other new or concerning symptoms return for reevaluation.

## 2021-01-25 ENCOUNTER — Other Ambulatory Visit: Payer: Self-pay | Admitting: Internal Medicine

## 2021-01-25 DIAGNOSIS — M810 Age-related osteoporosis without current pathological fracture: Secondary | ICD-10-CM

## 2021-02-15 ENCOUNTER — Inpatient Hospital Stay: Admission: RE | Admit: 2021-02-15 | Payer: Medicare Other | Source: Ambulatory Visit

## 2021-02-19 ENCOUNTER — Other Ambulatory Visit: Payer: Self-pay | Admitting: Internal Medicine

## 2021-02-19 DIAGNOSIS — Z1231 Encounter for screening mammogram for malignant neoplasm of breast: Secondary | ICD-10-CM

## 2021-02-22 ENCOUNTER — Ambulatory Visit
Admission: RE | Admit: 2021-02-22 | Discharge: 2021-02-22 | Disposition: A | Discharge status: Home / Self Care | Payer: Medicare Other | Source: Ambulatory Visit | Attending: Internal Medicine | Admitting: Internal Medicine

## 2021-02-22 ENCOUNTER — Other Ambulatory Visit: Payer: Self-pay

## 2021-02-22 DIAGNOSIS — Z1231 Encounter for screening mammogram for malignant neoplasm of breast: Secondary | ICD-10-CM

## 2021-04-12 ENCOUNTER — Other Ambulatory Visit: Payer: Self-pay | Admitting: Internal Medicine

## 2021-06-07 ENCOUNTER — Ambulatory Visit (INDEPENDENT_AMBULATORY_CARE_PROVIDER_SITE_OTHER): Payer: Medicare Other | Admitting: Primary Care

## 2021-06-07 ENCOUNTER — Other Ambulatory Visit: Payer: Self-pay

## 2021-06-07 ENCOUNTER — Encounter: Payer: Self-pay | Admitting: Primary Care

## 2021-06-07 DIAGNOSIS — J449 Chronic obstructive pulmonary disease, unspecified: Secondary | ICD-10-CM | POA: Diagnosis not present

## 2021-06-07 DIAGNOSIS — J9611 Chronic respiratory failure with hypoxia: Secondary | ICD-10-CM | POA: Diagnosis not present

## 2021-06-07 MED ORDER — STIOLTO RESPIMAT 2.5-2.5 MCG/ACT IN AERS
2.0000 | INHALATION_SPRAY | Freq: Every day | RESPIRATORY_TRACT | 11 refills | Status: DC
Start: 2021-06-07 — End: 2021-06-07

## 2021-06-07 MED ORDER — STIOLTO RESPIMAT 2.5-2.5 MCG/ACT IN AERS: 2.0000 | INHALATION_SPRAY | Freq: Every day | RESPIRATORY_TRACT | 0 refills | Status: DC

## 2021-06-07 NOTE — Assessment & Plan Note (Signed)
-   Ambulatory O2 desaturated to 86%, requiring 3L pulsed - Sending in DME order to renew oxygen and POC

## 2021-06-07 NOTE — Progress Notes (Signed)
@Patient  ID: [name] [name], female    DOB: 1961-01-19, 60 y.o.   MRN: [id number]  Chief Complaint  Patient presents with   Follow-up    Sob with exertion     Referring provider: Jilda Panda, MD   Synopsis: First referred in 2017 for evaluation of COPD/ active smoker 2016 pulmonary function testing from [facility] showed FEV1 of 870 mL (37% predicted) consistent with severe airflow obstruction 2015 CT scan from [place] showed severe panlobular emphysema She is prescribed 2 L O2 qHS and she sometimes uses a portable tank.   HPI: 60 year old female, some day smoker.  Past medical history significant for COPD, bullous emphysema, chronic respiratory failure with hypoxia. Patient of Dr. Melvyn Novas, last seen on 09/26/20. She was started on Protonix and Pepcid for GERD.  Maintenance inhaler Serevent/Spiriva was changed to Stiolto 2 puffs daily.   Previous [facility] pulmonary encounter:  09/26/2020  f/u ov/Wert re:   Back on serevent and spiriva "stiolto stopped workingTour manager Complaint  Patient presents with   Follow-up    denies any issues with breathing   Dyspnea: [employer] lion does fine  Cough: min am mucoid  Sleeping: no respiratory complaints flat bed/ one pillow  SABA use: 2 x weekly 02: 2lpm at bedtime / no portable   No obvious day to day or daytime variability or assoc excess/ purulent sputum or mucus plugs or hemoptysis or cp or chest tightness, subjective wheeze or overt sinus or hb symptoms.   Sleeping without nocturnal   exacerbation  of respiratory  c/o's or need for noct saba. Also denies any obvious fluctuation of symptoms with weather or environmental changes or other aggravating or alleviating factors except as outlined above    06/07/2021- Interim hx  Patient presents today to re-qualify for oxygen/ needs new supplies. She is doing well today. States that her breathing is up and down, heat bothers her. She has been using Spiriva and Albuterol only. She is not currently taking Servent or  Stiolto. She wears 2L oxygen at night and while at home. She is very reluctant to use oxygen when she leaves her house, she does not want to feel like a burden. She is also hesitant to try pulmonary. Her DME company is [employer].     No Known Allergies  Immunization History  Administered Date(s) Administered   Influenza Split 09/27/2014, 12/06/2015, 11/18/2018   Influenza,inj,Quad PF,6+ Mos 08/27/2016, 09/05/2017, 09/21/2019   PFIZER(Purple Top)SARS-COV-2 Vaccination 02/27/2020, 04/10/2020    Past Medical History:  Diagnosis Date   Anemia    Anxiety    Arthritis    OSTEOARTHRITIS   Blood dyscrasia    EASY BLEEDER   Colon polyps    COPD (chronic obstructive pulmonary disease) (HCC)    Depression    Osteoporosis    Seasonal allergies    Sickle cell trait (HCC)    Uterine fibroid     Tobacco History: Social History   Tobacco Use  Smoking Status Some Days   Packs/day: 0.50   Years: 42.00   Pack years: 21.00   Types: Cigarettes  Smokeless Tobacco Never   Ready to quit: Not Answered Counseling given: Not Answered   Outpatient Medications Prior to Visit  Medication Sig Dispense Refill   albuterol (PROVENTIL) (2.5 MG/3ML) 0.083% nebulizer solution Take 3 mLs (2.5 mg total) by nebulization every 6 (six) hours as needed for wheezing or shortness of breath. 360 mL 12   albuterol (VENTOLIN HFA) 108 (90 Base) MCG/ACT inhaler Inhale 2 puffs  into the lungs every 6 (six) hours as needed for wheezing or shortness of breath. 1 g 3   amLODipine (NORVASC) 5 MG tablet Take 5 mg by mouth daily.     buPROPion (WELLBUTRIN SR) 100 MG 12 hr tablet Take 100 mg by mouth 2 (two) times daily.       colchicine 0.6 MG tablet Take 1 tablet (0.6 mg total) by mouth daily. Take 2 tablets today as soon as you pick up the prescription.  And then 1 more tablet tonight before bed.  If your pain is persisting when you return tomorrow morning continue to take 1 tablet in the morning and 1 at night until your  pain resolves. (Patient taking differently: Take 0.6 mg by mouth daily. Take 2 tablets today as soon as you pick up the prescription.  And then 1 more tablet tonight before bed.  If your pain is persisting when you return tomorrow morning continue to take 1 tablet in the morning and 1 at night until your pain resolves.) 14 tablet 0   famotidine (PEPCID) 20 MG tablet One after supper 30 tablet 11   Multiple Vitamin (MULTIVITAMIN) capsule Take 1 capsule by mouth daily.     OXYGEN Inhale 2 L into the lungs as needed (for shortness of breath).      raloxifene (EVISTA) 60 MG tablet Take 60 mg by mouth daily.       solifenacin (VESICARE) 5 MG tablet Take 5 mg by mouth daily.     Tetrahydrozoline HCl (VISINE OP) Place 1 drop into both eyes as needed (for dry eyes).     SPIRIVA RESPIMAT 1.25 MCG/ACT AERS INHALE 2 PUFFS INTO THE LUNGS DAILY 4 g 3   STIOLTO RESPIMAT 2.5-2.5 MCG/ACT AERS INHALE 2 PUFFS INTO THE LUNGS DAILY 4 g 1   methocarbamol (ROBAXIN) 500 MG tablet Take 1 tablet (500 mg total) by mouth 2 (two) times daily. (Patient not taking: Reported on 06/07/2021) 20 tablet 0   azithromycin (ZITHROMAX) 250 MG tablet Take two today and then one daily until finished. (Patient not taking: Reported on 06/07/2021) 6 tablet 0   HYDROcodone-acetaminophen (NORCO) 5-325 MG tablet Take 1 tablet by mouth every 6 (six) hours as needed. (Patient not taking: Reported on 06/07/2021) 10 tablet 0   predniSONE (DELTASONE) 10 MG tablet Take 4tabs x2days, 2tabsx2days, 1tabx2days, then stop (Patient not taking: Reported on 06/07/2021) 14 tablet 0   No facility-administered medications prior to visit.    Review of Systems  Review of Systems  Constitutional: Negative.   HENT: Negative.    Respiratory:  Negative for cough, chest tightness, shortness of breath and wheezing.        Dyspnea with exertion  Cardiovascular: Negative.     Physical Exam  BP 100/60 (BP Location: Right Arm, Cuff Size: Normal)   Pulse 99   Temp  98.8 F (37.1 C) (Temporal)   Ht 5' 4.5" (1.638 m)   Wt 132 lb (59.9 kg)   SpO2 99%   BMI 22.31 kg/m  Physical Exam Constitutional:      Appearance: Normal appearance.  HENT:     Head: Normocephalic and atraumatic.     Mouth/Throat:     Mouth: Mucous membranes are moist.     Pharynx: Oropharynx is clear.  Cardiovascular:     Rate and Rhythm: Normal rate and regular rhythm.  Pulmonary:     Effort: Pulmonary effort is normal.     Breath sounds: Normal breath sounds.  Comments: CTA Skin:    General: Skin is warm and dry.  Neurological:     General: No focal deficit present.     Mental Status: She is alert and oriented to person, place, and time. Mental status is at baseline.  Psychiatric:        Mood and Affect: Mood normal.        Behavior: Behavior normal.        Thought Content: Thought content normal.        Judgment: Judgment normal.     Lab Results:  CBC    Component Value Date/Time   WBC 9.4 06/08/2018 2132   RBC 5.10 01/19/2019 1130   HGB 14.7 01/19/2019 1130   HCT 44.8 01/19/2019 1130   PLT 221 06/08/2018 2132   MCV 87.8 01/19/2019 1130   MCH 28.8 01/19/2019 1130   MCHC 34.4 06/08/2018 2132   RDW 14.5 01/19/2019 1130   LYMPHSABS 3.9 06/08/2018 2132   MONOABS 0.5 06/08/2018 2132   EOSABS 0.2 06/08/2018 2132   BASOSABS 0.1 06/08/2018 2132    BMET    Component Value Date/Time   NA 140 06/08/2018 2132   K 3.9 06/08/2018 2132   CL 109 06/08/2018 2132   CO2 24 06/08/2018 2132   GLUCOSE 120 (H) 06/08/2018 2132   BUN 20 06/08/2018 2132   CREATININE 1.03 (H) 06/08/2018 2132   CREATININE 0.75 10/28/2016 1101   CALCIUM 9.3 06/08/2018 2132   GFRNONAA 58 (L) 06/08/2018 2132   GFRAA >60 06/08/2018 2132    BNP    Component Value Date/Time   BNP 14.0 09/02/2017 1631    ProBNP No results found for: PROBNP  Imaging: No results found.   Assessment & Plan:   COPD  GOLD 3  - Stable; No recent exacerbations. Experiences dyspnea with exertion.  CAT 23. Compliant with Spiriva, not currently using Serevent.   - Recommend changing maintenance inhaler to [employer] respimat 2 puffs one daily  - Referring to pulmonary rehab  - FU in 3 months with Dr. Melvyn Novas   Chronic respiratory failure with hypoxia ([place]) - Ambulatory O2 desaturated to 86%, requiring 3L pulsed - Sending in DME order to renew oxygen and O'Brien, NP 06/07/2021

## 2021-06-07 NOTE — Patient Instructions (Signed)
Nice seeing you today   COPD: - Stop Spiriva - Start Stiolto - take 2 puffs every morning  Chronic respiratory failure: - You need to wear 2L oxygen while at home and at night (use 3L with portable concentrator)  Orders: - Renew oxygen with [employer], needs 3L and POC  Follow-up: - 3 months with Dr. Melvyn Novas or sooner if needed

## 2021-06-07 NOTE — Addendum Note (Signed)
Addended by: Mathis Bud on: 06/07/2021 11:54 AM   Modules accepted: Orders

## 2021-06-07 NOTE — Addendum Note (Signed)
Addended by: Mathis Bud on: 06/07/2021 11:59 AM   Modules accepted: Orders

## 2021-06-07 NOTE — Assessment & Plan Note (Addendum)
-   Stable; No recent exacerbations. Experiences dyspnea with exertion. CAT 23. Compliant with Spiriva, not currently using Serevent.   - Recommend changing maintenance inhaler to Stiolto respimat 2 puffs one daily  - Referring to pulmonary rehab  - FU in 3 months with Dr. Melvyn Novas

## 2021-06-15 ENCOUNTER — Telehealth (HOSPITAL_COMMUNITY): Payer: Self-pay

## 2021-06-15 NOTE — Telephone Encounter (Signed)
Recv'ed office referral from Dr. Melvyn Novas, contact pt in regards to PR. Pt stated she is not interested at this time.   Closed referral

## 2021-06-26 ENCOUNTER — Other Ambulatory Visit: Payer: Self-pay

## 2021-06-26 ENCOUNTER — Ambulatory Visit
Admission: RE | Admit: 2021-06-26 | Discharge: 2021-06-26 | Disposition: A | Discharge status: Home / Self Care | Payer: Medicare Other | Source: Ambulatory Visit | Attending: Internal Medicine | Admitting: Internal Medicine

## 2021-06-26 DIAGNOSIS — M810 Age-related osteoporosis without current pathological fracture: Secondary | ICD-10-CM

## 2021-08-10 ENCOUNTER — Encounter (HOSPITAL_COMMUNITY): Payer: Self-pay

## 2021-08-10 ENCOUNTER — Emergency Department (HOSPITAL_COMMUNITY): Payer: Medicare Other

## 2021-08-10 ENCOUNTER — Observation Stay (HOSPITAL_COMMUNITY)
Admission: EM | Admit: 2021-08-10 | Discharge: 2021-08-11 | Disposition: A | Discharge status: Home / Self Care | Payer: Medicare Other | Attending: Internal Medicine | Admitting: Internal Medicine

## 2021-08-10 ENCOUNTER — Other Ambulatory Visit: Payer: Self-pay

## 2021-08-10 DIAGNOSIS — E872 Acidosis: Secondary | ICD-10-CM

## 2021-08-10 DIAGNOSIS — Z79899 Other long term (current) drug therapy: Secondary | ICD-10-CM | POA: Diagnosis not present

## 2021-08-10 DIAGNOSIS — Z9981 Dependence on supplemental oxygen: Secondary | ICD-10-CM | POA: Insufficient documentation

## 2021-08-10 DIAGNOSIS — Z20822 Contact with and (suspected) exposure to covid-19: Secondary | ICD-10-CM | POA: Diagnosis not present

## 2021-08-10 DIAGNOSIS — J441 Chronic obstructive pulmonary disease with (acute) exacerbation: Secondary | ICD-10-CM | POA: Diagnosis not present

## 2021-08-10 DIAGNOSIS — J189 Pneumonia, unspecified organism: Secondary | ICD-10-CM

## 2021-08-10 DIAGNOSIS — E8729 Other acidosis: Secondary | ICD-10-CM

## 2021-08-10 DIAGNOSIS — R0902 Hypoxemia: Secondary | ICD-10-CM

## 2021-08-10 DIAGNOSIS — J439 Emphysema, unspecified: Secondary | ICD-10-CM | POA: Diagnosis present

## 2021-08-10 DIAGNOSIS — J9602 Acute respiratory failure with hypercapnia: Principal | ICD-10-CM | POA: Diagnosis present

## 2021-08-10 DIAGNOSIS — F17210 Nicotine dependence, cigarettes, uncomplicated: Secondary | ICD-10-CM | POA: Insufficient documentation

## 2021-08-10 DIAGNOSIS — Z8616 Personal history of COVID-19: Secondary | ICD-10-CM | POA: Diagnosis not present

## 2021-08-10 DIAGNOSIS — R0602 Shortness of breath: Secondary | ICD-10-CM | POA: Diagnosis present

## 2021-08-10 LAB — I-STAT VENOUS BLOOD GAS, ED
Acid-base deficit: 2 mmol/L (ref 0.0–2.0)
Acid-base deficit: 1 mmol/L (ref 0.0–2.0)
Acid-base deficit: 2 mmol/L (ref 0.0–2.0)
Bicarbonate: 27.9 mmol/L (ref 20.0–28.0)
Bicarbonate: 27.9 mmol/L (ref 20.0–28.0)
Bicarbonate: 24.1 mmol/L (ref 20.0–28.0)
Calcium, Ion: 1.21 mmol/L (ref 1.15–1.40)
Calcium, Ion: 1.14 mmol/L — ABNORMAL LOW (ref 1.15–1.40)
Calcium, Ion: 1.15 mmol/L (ref 1.15–1.40)
HCT: 49 % — ABNORMAL HIGH (ref 36.0–46.0)
HCT: 47 % — ABNORMAL HIGH (ref 36.0–46.0)
HCT: 47 % — ABNORMAL HIGH (ref 36.0–46.0)
Hemoglobin: 16.7 g/dL — ABNORMAL HIGH (ref 12.0–15.0)
Hemoglobin: 16 g/dL — ABNORMAL HIGH (ref 12.0–15.0)
Hemoglobin: 16 g/dL — ABNORMAL HIGH (ref 12.0–15.0)
O2 Saturation: 86 %
O2 Saturation: 94 %
O2 Saturation: 86 %
Potassium: 3.8 mmol/L (ref 3.5–5.1)
Potassium: 4.2 mmol/L (ref 3.5–5.1)
Potassium: 4.3 mmol/L (ref 3.5–5.1)
Sodium: 137 mmol/L (ref 135–145)
Sodium: 138 mmol/L (ref 135–145)
Sodium: 138 mmol/L (ref 135–145)
TCO2: 30 mmol/L (ref 22–32)
TCO2: 30 mmol/L (ref 22–32)
TCO2: 25 mmol/L (ref 22–32)
pCO2, Ven: 67.8 mmHg — ABNORMAL HIGH (ref 44.0–60.0)
pCO2, Ven: 60.6 mmHg — ABNORMAL HIGH (ref 44.0–60.0)
pCO2, Ven: 44.3 mmHg (ref 44.0–60.0)
pH, Ven: 7.222 — ABNORMAL LOW (ref 7.250–7.430)
pH, Ven: 7.271 (ref 7.250–7.430)
pH, Ven: 7.344 (ref 7.250–7.430)
pO2, Ven: 63 mmHg — ABNORMAL HIGH (ref 32.0–45.0)
pO2, Ven: 84 mmHg — ABNORMAL HIGH (ref 32.0–45.0)
pO2, Ven: 55 mmHg — ABNORMAL HIGH (ref 32.0–45.0)

## 2021-08-10 LAB — BASIC METABOLIC PANEL
Anion gap: 8 (ref 5–15)
BUN: 12 mg/dL (ref 8–23)
CO2: 26 mmol/L (ref 22–32)
Calcium: 9 mg/dL (ref 8.9–10.3)
Chloride: 102 mmol/L (ref 98–111)
Creatinine, Ser: 0.88 mg/dL (ref 0.44–1.00)
GFR, Estimated: >60 mL/min (ref >60)
Glucose, Bld: 134 mg/dL — ABNORMAL HIGH (ref 70–99)
Potassium: 3.8 mmol/L (ref 3.5–5.1)
Sodium: 136 mmol/L (ref 135–145)

## 2021-08-10 LAB — CBC WITH DIFFERENTIAL/PLATELET
Abs Immature Granulocytes: 0.03 10*3/uL (ref 0.00–0.07)
Basophils Absolute: 0.1 10*3/uL (ref 0.0–0.1)
Basophils Relative: 1 %
Eosinophils Absolute: 0.1 10*3/uL (ref 0.0–0.5)
Eosinophils Relative: 1 %
HCT: 45.4 % (ref 36.0–46.0)
Hemoglobin: 15.3 g/dL — ABNORMAL HIGH (ref 12.0–15.0)
Immature Granulocytes: 0 %
Lymphocytes Relative: 40 %
Lymphs Abs: 3.6 10*3/uL (ref 0.7–4.0)
MCH: 28.9 pg (ref 26.0–34.0)
MCHC: 33.7 g/dL (ref 30.0–36.0)
MCV: 85.7 fL (ref 80.0–100.0)
Monocytes Absolute: 0.8 10*3/uL (ref 0.1–1.0)
Monocytes Relative: 9 %
Neutro Abs: 4.5 10*3/uL (ref 1.7–7.7)
Neutrophils Relative %: 49 %
Platelets: 224 10*3/uL (ref 150–400)
RBC: 5.3 MIL/uL — ABNORMAL HIGH (ref 3.87–5.11)
RDW: 14.7 % (ref 11.5–15.5)
WBC: 9 10*3/uL (ref 4.0–10.5)
nRBC: 0 % (ref 0.0–0.2)

## 2021-08-10 LAB — RESP PANEL BY RT-PCR (FLU A&B, COVID) ARPGX2
Influenza A by PCR: NEGATIVE
Influenza B by PCR: NEGATIVE
SARS Coronavirus 2 by RT PCR: NEGATIVE

## 2021-08-10 LAB — TROPONIN I (HIGH SENSITIVITY)
Troponin I (High Sensitivity): 5 ng/L (ref <18)
Troponin I (High Sensitivity): 9 ng/L (ref <18)

## 2021-08-10 LAB — BRAIN NATRIURETIC PEPTIDE: B Natriuretic Peptide: 18.8 pg/mL (ref 0.0–100.0)

## 2021-08-10 MED ORDER — ARFORMOTEROL TARTRATE 15 MCG/2ML IN NEBU
15.0000 ug | INHALATION_SOLUTION | Freq: Two times a day (BID) | RESPIRATORY_TRACT | Status: DC
  Administered 2021-08-11: 15 ug via RESPIRATORY_TRACT
  Filled 2021-08-10: qty 2

## 2021-08-10 MED ORDER — NICOTINE 14 MG/24HR TD PT24: 14.0000 mg | MEDICATED_PATCH | Freq: Every day | TRANSDERMAL | Status: DC

## 2021-08-10 MED ORDER — ENOXAPARIN SODIUM 40 MG/0.4ML IJ SOSY
40.0000 mg | PREFILLED_SYRINGE | INTRAMUSCULAR | Status: DC
  Administered 2021-08-10: 40 mg via SUBCUTANEOUS
  Filled 2021-08-10: qty 0.4

## 2021-08-10 MED ORDER — AZITHROMYCIN 500 MG IV SOLR
250.0000 mg | INTRAVENOUS | Status: DC
  Filled 2021-08-10: qty 250

## 2021-08-10 MED ORDER — UMECLIDINIUM BROMIDE 62.5 MCG/INH IN AEPB
1.0000 | INHALATION_SPRAY | Freq: Every day | RESPIRATORY_TRACT | Status: DC
  Administered 2021-08-11: 1 via RESPIRATORY_TRACT
  Filled 2021-08-10: qty 7

## 2021-08-10 MED ORDER — ACETAMINOPHEN 325 MG PO TABS
650.0000 mg | ORAL_TABLET | Freq: Four times a day (QID) | ORAL | Status: DC | PRN
  Administered 2021-08-11: 650 mg via ORAL
  Filled 2021-08-10: qty 2

## 2021-08-10 MED ORDER — METHYLPREDNISOLONE SODIUM SUCC 125 MG IJ SOLR
125.0000 mg | Freq: Once | INTRAMUSCULAR | Status: AC
  Administered 2021-08-10: 125 mg via INTRAVENOUS
  Filled 2021-08-10: qty 2

## 2021-08-10 MED ORDER — CEFTRIAXONE SODIUM 1 G IJ SOLR: 1.0000 g | INTRAMUSCULAR | Status: DC

## 2021-08-10 MED ORDER — LACTATED RINGERS IV BOLUS
1000.0000 mL | Freq: Once | INTRAVENOUS | Status: AC
  Administered 2021-08-10: 1000 mL via INTRAVENOUS

## 2021-08-10 MED ORDER — BUPROPION HCL ER (SR) 100 MG PO TB12
100.0000 mg | ORAL_TABLET | Freq: Two times a day (BID) | ORAL | Status: DC
  Administered 2021-08-10 – 2021-08-11 (×2): 100 mg via ORAL
  Filled 2021-08-10 (×4): qty 1

## 2021-08-10 MED ORDER — SODIUM CHLORIDE 0.9 % IV SOLN
1.0000 g | Freq: Once | INTRAVENOUS | Status: AC
  Administered 2021-08-10: 1 g via INTRAVENOUS
  Filled 2021-08-10: qty 10

## 2021-08-10 MED ORDER — ALBUTEROL (5 MG/ML) CONTINUOUS INHALATION SOLN
20.0000 mg/h | INHALATION_SOLUTION | Freq: Once | RESPIRATORY_TRACT | Status: AC
  Administered 2021-08-10: 20 mg/h via RESPIRATORY_TRACT
  Filled 2021-08-10: qty 20

## 2021-08-10 MED ORDER — TETRAHYDROZOLINE HCL 0.05 % OP SOLN: 1.0000 [drp] | OPHTHALMIC | Status: DC | PRN

## 2021-08-10 MED ORDER — AMLODIPINE BESYLATE 5 MG PO TABS
5.0000 mg | ORAL_TABLET | Freq: Every day | ORAL | Status: DC
  Administered 2021-08-10 – 2021-08-11 (×2): 5 mg via ORAL
  Filled 2021-08-10 (×2): qty 1

## 2021-08-10 MED ORDER — SODIUM CHLORIDE 0.9 % IV SOLN
500.0000 mg | Freq: Once | INTRAVENOUS | Status: AC
  Administered 2021-08-10: 500 mg via INTRAVENOUS
  Filled 2021-08-10: qty 500

## 2021-08-10 MED ORDER — IPRATROPIUM-ALBUTEROL 0.5-2.5 (3) MG/3ML IN SOLN
3.0000 mL | Freq: Four times a day (QID) | RESPIRATORY_TRACT | Status: DC
  Administered 2021-08-10 – 2021-08-11 (×3): 3 mL via RESPIRATORY_TRACT
  Filled 2021-08-10 (×3): qty 3

## 2021-08-10 NOTE — H&P (Addendum)
Date: 08/10/2021               Patient Name:  [name] [name] MRN: [id number]  DOB: 02/20/1961 Age / Sex: 60 y.o., female   PCP: Jilda Panda, MD         Medical Service: Internal Medicine Teaching Service         Attending Physician: Dr. Joni Reining, DO    First Contact: Dr. Raymondo Band Pager: [phone]  Second Contact: Dr. Allyson Sabal Pager: [phone]       After Hours (After 5p/  First Contact Pager: [phone]  weekends / holidays): Second Contact Pager: [phone]   Chief Complaint: shortness of breath  History of Present Illness: [name] [name] is a 60 yo female with COPD on 2L [place], osteoporosis on prolia, GERD, diverticulosis, and tobacco use presenting for worsening shortness of breath. The patient states that she was in her usual state of health until this morning. The patient was at home watching TV when she became short of breath and experienced chest tightness all of a sudden. She does wear up to 3L [place] at home, but reportedly only uses this as needed, and this is usually only at night.  She does report that her sister is in the hospital and they had a family meeting today regarding the state of her sister's health. This made the patient very anxious and worked up, which the patient also believes made her struggle to breath. The patient started to get short of breath and choked up when speaking of this again on evaluation. She also felt like her heart was racing at this time and experienced significant chest tightness. The patient has never experienced shortness of breath at rest, until today. When EMS arrived at the patient's home, she was unable to speak due to significant shortness of breath. The patient was given 2g of mag, SoluMedrol, albuterol, and Atrovent en route to the hospital. She notes that she feels much better now.  The patient denies any fevers, chills, cough, sputum production, changes in appetite, dizziness, abd pain, n/v/d, melena, hematochezia, dysuria, hematuria, or any other  sxs at this time. The patient does endorse urinary frequency, but this is secondary to her "bladder pills" (solifenancin) that she takes.  In the ED, patient was started on 15 L nonrebreather and was switched to BiPAP. Pulse was 120bpm on arrival. Bedside echo did not show any signs of pericardial effusion. Patient was also given a continuous nebulizer and SoluMedrol. Initial VBG significant for pH 7.22, CO2 of 67.8, and bicarb of 28. BMP and CBC unremarkable. CXR showed new bandlike opacity in the right lower lobe and a retrocardiac opacity in the left lower lobe, consistent with atelectasis vs pneumonia. Patient received a dose of Rocephin and azithromycin in the ED.   Meds:  Albuterol Amlodipine '5mg'$  Bupropion Pepcide Robaxin Raloxefine Tiotropium bromide-olodaterol  Solifenacin No outpatient medications have been marked as taking for the 08/10/21 encounter [facility] Encounter).     Allergies: Allergies as of 08/10/2021   (No Known Allergies)   Past Medical History:  Diagnosis Date   Anemia    Anxiety    Arthritis    OSTEOARTHRITIS   Blood dyscrasia    EASY BLEEDER   Colon polyps    COPD (chronic obstructive pulmonary disease) (HCC)    Depression    Osteoporosis    Seasonal allergies    Sickle cell trait ([place])    Uterine fibroid     Family History: Significant for heart  disease, diabetes, andcancer  Social History:  Does smoke cigarettes currently, ~45 pack-year history. Rarely drinks alcohol, but does drink beer on occasion. Remote history of marijuana use, but has not used in over 20 years. The patient is independent in ADLs and lives alone at her home.   Review of Systems: A complete ROS was negative except as per HPI.   Physical Exam: Blood pressure (!) 145/96, pulse 99, resp. rate (!) 25, weight 59.9 kg, SpO2 100 %. Physical Exam Constitutional:      Appearance: Normal appearance. She is ill-appearing.  HENT:     Mouth/Throat:     Mouth: Mucous membranes are  dry.  Eyes:     Pupils: Pupils are equal, round, and reactive to light.  Cardiovascular:     Rate and Rhythm: Regular rhythm. Tachycardia present.     Pulses: Normal pulses.     Heart sounds: No murmur heard. Pulmonary:     Effort: Respiratory distress present.     Comments: Decreased breath sounds bilaterally. Unable to fully appreciate as patient is on BiPAP  Abdominal:     General: There is no distension.     Palpations: Abdomen is soft.     Tenderness: There is no abdominal tenderness.  Musculoskeletal:     Right lower leg: No edema.     Left lower leg: No edema.  Skin:    General: Skin is warm and dry.  Neurological:     General: No focal deficit present.     Mental Status: She is alert and oriented to person, place, and time. Mental status is at baseline.  Psychiatric:        Mood and Affect: Mood normal.        Behavior: Behavior normal.    EKG: personally reviewed my interpretation is sinus tachycardia, HR ~120bpm  CXR: personally reviewed my interpretation is: new opacity in right medial lower lobe and opacity noted in left lower lobe compared to CXR from June.  Assessment & Plan by Problem: Principal Problem:   Acute hypercapnic respiratory failure (HCC) Active Problems:   COPD with emphysema (HCC)  #Acute on chronic hypercapnic respiratory failure with respiratory acidosis #COPD with emphysema Follows with Dr. Melvyn Novas, pulmonology and is on 3L [place] as needed at baseline. Patient presented with worsening shortness of breath at rest and was placed on 15 L non-rebreather and switched to BiPAP in the ED. Only cardinal symptom of COPD for this patient is worsening shortness of breath. She denies any cough or sputum production at this time. Given patient has 1/3 cardinal symptoms, this is likely a mild COPD exacerbation. Patient was given continuous nebulizer, Solumedrol, Rocephin, and azithromycin in the ED. Initial VBG significant for pH 7.22, CO2 67.8, and bicarb of 28.  Repeat VBG 3 hours later after patient placed on BiPAP improved to pH 7.27, CO2 60.6, and bicarb 28. BNP within normal limits and troponin x2 also not elevated. CXR consistent with multifocal PNA, although the patient remains afebrile and no leukocytosis noted on CBC, she did get a dose of azithromycin and rocephin in the ED.  Overall, BiPAP has improved the patient's symptoms. Patient made NPO while she is wearing BiPAP and will resume a regular diet for the patient when she comes off BiPAP, assuming that she is mentating well.  - Continue azithromycin and rocephin for CAP coverage - Duonebs q6, Brovana BID, and Incruse Ellipta once daily  - Wean O2 as able - Advised patient to wear BiPAP at night -  Will check ambulatory pulse ox in the morning if the patient is clinically improving - Incentive spirometry q4 hours while awake  - PT/OT eval and treat  #Tobacco use disorder Patient has ~45+ pack year history. She continues to smoke cigarettes despite having COPD and requiring supplemental O2 at home, which is likely contributing to her COPD exacerbation and worsening respiratory symptoms at this time. Patient could benefit from [facility] consult for tobacco cessation resources. - Nicotine patch change daily   #Hypertension Patient takes amlodipine '5mg'$  at home daily. BP elevated on arrival to 186/113. Blood pressures improved with SBP in the 130s in the ED. - Continue home amlodipine  #Depression Takes bupropion '100mg'$  BID at home for depression. - Continue bupropion   Best practices:  DVT prophylaxis: Lovenox Diet: NPO until patient off of BiPAP, regular diet after Fluids: 1 L LR bolus Code: Full Dispo: Admit patient to Observation with expected length of stay less than 2 midnights.  SignedDorethea Clan, DO 08/10/2021, 4:20 PM  Pager: '@[email or web address]'$ @ After 5pm on weekdays and 1pm on weekends: On Call pager: [phone]

## 2021-08-10 NOTE — ED Triage Notes (Signed)
Pt BIB [facility] from home d/t c/o difficulty breathing. Pt reports to EMS her breathing difficulty started "today" & 88% on RA unable to speak more than a couple words d/t SOB. while in route to ED she was given 2G Mag, Solumedrol 125, 10 Albuterol, 1 Atrovent & refused to wear the CPAP. Her pulse was 120 bpm upon arrival & she was switched to NRB O2 in the room and is very anxious with this apparatus.

## 2021-08-10 NOTE — ED Notes (Signed)
RT at bedside.

## 2021-08-10 NOTE — ED Notes (Signed)
CPAP has been applied to pt.

## 2021-08-10 NOTE — ED Notes (Signed)
Night time resident, Tarri Glenn, MD at bedside with patient  And updated patient about plan of care.Orders updated as patient needed PRN pain management. Patient also given food to eat as patient has been on nasal cannula for past 3 hours and mentation is at baseline (A&Ox4).

## 2021-08-10 NOTE — Progress Notes (Signed)
RT note. Patient placed on 3L [place] at this time, per patient 3L is what she wears at home. No labored breathing at this time, RT will continue to monitor.

## 2021-08-10 NOTE — ED Provider Notes (Signed)
ATTENDING SUPERVISORY NOTE I have personally viewed the imaging studies performed. I have personally seen and examined the patient, and discussed the plan of care with the resident.  I have reviewed the documentation of the resident and agree.  Acute exacerbation of chronic obstructive pulmonary disease (COPD) ([place])  Hypoxia  Community acquired pneumonia, bilateral  .Critical Care  Date/Time: 08/10/2021 2:18 PM Performed by: Elnora Morrison, MD Authorized by: Elnora Morrison, MD   Critical care provider statement:    Critical care time (minutes):  80   Critical care start time:  08/10/2021 1:00 PM   Critical care end time:  08/10/2021 2:20 PM   Critical care time was exclusive of:  Separately billable procedures and treating other patients and teaching time   Critical care was necessary to treat or prevent imminent or life-threatening deterioration of the following conditions:  Respiratory failure   Critical care was time spent personally by me on the following activities:  Evaluation of patient's response to treatment, examination of patient, ordering and performing treatments and interventions, ordering and review of laboratory studies, ordering and review of radiographic studies, pulse oximetry, re-evaluation of patient's condition, obtaining history from patient or surrogate and review of old charts    Elnora Morrison, MD 08/11/21 2355

## 2021-08-10 NOTE — Hospital Course (Addendum)
[name] [name] is a 60 yo female with COPD on 2L [place], osteoporosis on prolia, GERD, diverticulosis, and tobacco use presenting for worsening shortness of breath and admitted for acute on chronic hypercapnic respiratory failure with respiratory acidosis.     #Acute on chronic hypercapnic respiratory failure with respiratory acidosis #COPD with emphysema #Pneumonia Follows with Dr. Melvyn Novas, pulmonology and is on 3L [place] as needed at baseline. Patient presented with worsening shortness of breath at rest and was placed on 15 L non-rebreather and switched to BiPAP in the ED. She denies any cough or sputum production at this time. Patient also given continuous nebulizer, Solumedrol, Rocephin, and azithromycin in the ED. Initial VBG significant for pH 7.22, CO2 67.8, and bicarb of 28. Repeat VBG 3 hours later after patient placed on BiPAP improved to pH 7.27, CO2 60.6, and bicarb 28. BNP within normal limits and troponin x2 also not elevated. BiPAP has improved the patient's symptoms overall. Patient made NPO while she is wearing BiPAP and will resumed a regular diet when she came off BiPAP. Repeat VBG improved to pH 7.34, CO2 44, and bicarb of 24. Patient was sating well and mentating well on 3L [place], which is her baseline at home. Received 2nd dose of azithromycin and rocephin in the ED on 8/27 and will be discharged on 3 days of azithromycin and cefpodoxime to complete a 5 day course of antibiotics for CAP coverage. Her breathing is improved and she continues to deny any chest tightness, cough, or sputum production. Also remained febrile with no leukocytosis. Patient also worked with PT and is able to ambulate well, with her baseline of 3L [place]. Stable to be discharged to home and advised to follow up with pulmonologist in 1 month as previously scheduled.  #Tobacco use disorder Patient has ~45+ pack year history. She continues to smoke cigarettes despite having COPD and requiring supplemental O2 at home, which is likely  contributing to her COPD exacerbation and worsening respiratory symptoms at this time. Patient could benefit from [facility] consult for tobacco cessation resources and she expresses understanding of the benefits of quitting smoking. Ordered nicotine patch while patient was hospitalized, but she does not like to use these.   #Hypertension Patient takes amlodipine '5mg'$  at home daily. BP elevated on arrival to 186/113. Blood pressures improved with SBP in the 130s in the ED.  #Depression Takes bupropion '100mg'$  BID at home for depression.

## 2021-08-10 NOTE — ED Provider Notes (Signed)
[facility] EMERGENCY DEPARTMENT Provider Note   CSN: [id number] Arrival date & time: 08/10/21  1212     History Chief Complaint  Patient presents with   Resp Distress    [name] [name] is a 60 y.o. female.  HPI Patient is a 60 year old female with a past medical history of COPD on home oxygen that is presenting for shortness of breath.  Per EMS patients she started having difficulty breathing today and was unable to speak due to the shortness of breath when they arrived.  They gave the patient 2 g of mag, Solu-Medrol, albuterol, Atrovent.  She states that her shortness of breath started today.  She states that her sister is in the hospital and she became worked up and became short of breath.     Patient denies any fevers, chills, headache, neck stiffness, chest pain, nausea, vomiting, diarrhea, numbness or weakness.    Past Medical History:  Diagnosis Date   Anemia    Anxiety    Arthritis    OSTEOARTHRITIS   Blood dyscrasia    EASY BLEEDER   Colon polyps    COPD (chronic obstructive pulmonary disease) ([place])    Depression    Osteoporosis    Seasonal allergies    Sickle cell trait ([place])    Uterine fibroid     Patient Active Problem List   Diagnosis Date Noted   COVID-19 10/25/2020   COPD  GOLD 3  06/11/2018   Protein calorie malnutrition ([place]) 05/21/2017   Chronic respiratory failure with hypoxia ([place]) 05/21/2017   Abnormal CT scan 12/11/2016   Cigarette smoker 02/06/2016   COPD with emphysema ([place]) 02/06/2016   OBSTRUCTIVE CHRONIC BRONCHITIS WITHOUT EXACERBAT 12/01/2008   EMPHYSEMA, BULLOUS 12/01/2008   DEPRESSION 04/05/2008   RESTRICTIVE LUNG DISEASE 04/05/2008   GERD 04/05/2008   DIVERTICULOSIS, COLON 04/05/2008   IRRITABLE BOWEL SYNDROME 04/05/2008   HEMORRHOIDS, HX OF 04/05/2008    Past Surgical History:  Procedure Laterality Date   CESAREAN SECTION  [place] N/A 09/30/2017   Procedure:  DILATATION & CURETTAGE/HYSTEROSCOPY WITH MYOSURE MYOMECTOMY;  Surgeon: Thurnell Lose, MD;  Location: [facility];  Service: Gynecology;  Laterality: N/A;  PMB     OB History   No obstetric history on file.     Family History  Problem Relation Age of Onset   Stomach cancer Father 75   Throat cancer Brother    Heart disease Mother    Breast cancer Neg Hx     Social History   Tobacco Use   Smoking status: Some Days    Packs/day: 0.50    Years: 42.00    Pack years: 21.00    Types: Cigarettes   Smokeless tobacco: Never  Vaping Use   Vaping Use: Never used  Substance Use Topics   Alcohol use: Yes    Alcohol/week: 0.0 standard drinks    Comment: occasional   Drug use: No    Home Medications Prior to Admission medications   Medication Sig Start Date End Date Taking? Authorizing Provider  albuterol (PROVENTIL) (2.5 MG/3ML) 0.083% nebulizer solution Take 3 mLs (2.5 mg total) by nebulization every 6 (six) hours as needed for wheezing or shortness of breath. 02/06/16   Juanito Doom, MD  albuterol (VENTOLIN HFA) 108 (90 Base) MCG/ACT inhaler Inhale 2 puffs into the lungs every 6 (six) hours as needed for wheezing or shortness of breath. 06/13/20   Martyn Ehrich, NP  amLODipine (NORVASC) 5  MG tablet Take 5 mg by mouth daily. 05/11/20   [provider]  buPROPion (WELLBUTRIN SR) 100 MG 12 hr tablet Take 100 mg by mouth 2 (two) times daily.      [provider]  colchicine 0.6 MG tablet Take 1 tablet (0.6 mg total) by mouth daily. Take 2 tablets today as soon as you pick up the prescription.  And then 1 more tablet tonight before bed.  If your pain is persisting when you return tomorrow morning continue to take 1 tablet in the morning and 1 at night until your pain resolves. Patient taking differently: Take 0.6 mg by mouth daily. Take 2 tablets today as soon as you pick up the prescription.  And then 1 more tablet tonight before bed.  If your pain is persisting when  you return tomorrow morning continue to take 1 tablet in the morning and 1 at night until your pain resolves. 09/10/20   Sponseller, Gypsy Balsam, PA-C  famotidine (PEPCID) 20 MG tablet One after supper 05/25/20   Tanda Rockers, MD  methocarbamol (ROBAXIN) 500 MG tablet Take 1 tablet (500 mg total) by mouth 2 (two) times daily. Patient not taking: Reported on 06/07/2021 01/23/21   Jacqlyn Larsen, PA-C  Multiple Vitamin (MULTIVITAMIN) capsule Take 1 capsule by mouth daily.    [provider]  OXYGEN Inhale 2 L into the lungs as needed (for shortness of breath).     [provider]  raloxifene (EVISTA) 60 MG tablet Take 60 mg by mouth daily.      [provider]  solifenacin (VESICARE) 5 MG tablet Take 5 mg by mouth daily. 05/11/20   [provider]  Tetrahydrozoline HCl (VISINE OP) Place 1 drop into both eyes as needed (for dry eyes).    [provider]  Tiotropium Bromide-Olodaterol (STIOLTO RESPIMAT) 2.5-2.5 MCG/ACT AERS Inhale 2 puffs into the lungs daily. 06/07/21   Martyn Ehrich, NP    Allergies    Patient has no known allergies.  Review of Systems   Review of Systems  Constitutional:  Negative for chills, diaphoresis, fatigue and fever.  HENT:  Negative for congestion, dental problem, ear discharge, ear pain, facial swelling, hearing loss, nosebleeds, postnasal drip, rhinorrhea, sinus pain, sneezing, sore throat and trouble swallowing.   Eyes:  Negative for pain and visual disturbance.  Respiratory:  Positive for cough and shortness of breath. Negative for chest tightness, wheezing and stridor.   Cardiovascular:  Negative for chest pain, palpitations and leg swelling.  Gastrointestinal:  Negative for abdominal distention, abdominal pain, blood in stool, constipation, diarrhea, nausea and vomiting.  Endocrine: Negative for polydipsia and polyuria.  Genitourinary:  Negative for difficulty urinating, dysuria, flank pain, frequency, hematuria,  urgency, vaginal bleeding and vaginal discharge.  Musculoskeletal:  Negative for myalgias, neck pain and neck stiffness.  Skin:  Negative for rash and wound.  Allergic/Immunologic: Negative for environmental allergies and food allergies.  Neurological:  Negative for dizziness, seizures, syncope, facial asymmetry, speech difficulty, weakness, light-headedness, numbness and headaches.  Psychiatric/Behavioral:  Negative for agitation, behavioral problems and confusion.    Physical Exam Updated Vital Signs BP 135/90   Pulse (!) 108   Resp (!) 28   SpO2 100%   Physical Exam Vitals and nursing note reviewed.  Constitutional:      General: She is not in acute distress.    Appearance: Normal appearance. She is normal weight. She is not ill-appearing.  HENT:     Head: Normocephalic and  atraumatic.     Right Ear: External ear normal.     Left Ear: External ear normal.     Nose: Nose normal. No congestion.     Mouth/Throat:     Mouth: Mucous membranes are moist.     Pharynx: Oropharynx is clear. No oropharyngeal exudate or posterior oropharyngeal erythema.  Eyes:     General: No visual field deficit.    Extraocular Movements: Extraocular movements intact.     Conjunctiva/sclera: Conjunctivae normal.     Pupils: Pupils are equal, round, and reactive to light.  Neck:     Vascular: No carotid bruit.  Cardiovascular:     Rate and Rhythm: Normal rate and regular rhythm.     Pulses: Normal pulses.     Heart sounds: Normal heart sounds. No murmur heard. Pulmonary:     Effort: Respiratory distress present.     Breath sounds: No stridor. No wheezing, rhonchi or rales.     Comments: Decreased breath sounds Chest:     Chest wall: No tenderness.  Abdominal:     General: Bowel sounds are normal. There is no distension.     Palpations: Abdomen is soft.     Tenderness: There is no abdominal tenderness. There is no right CVA tenderness, left CVA tenderness, guarding or rebound.  Musculoskeletal:         General: No swelling or tenderness. Normal range of motion.     Cervical back: Normal range of motion and neck supple. No rigidity, tenderness or bony tenderness.     Thoracic back: Normal. No tenderness or bony tenderness.     Lumbar back: Normal. No tenderness or bony tenderness.     Right lower leg: No edema.     Left lower leg: No edema.  Skin:    General: Skin is warm and dry.     Coloration: Skin is not jaundiced.  Neurological:     General: No focal deficit present.     Mental Status: She is alert and oriented to person, place, and time. Mental status is at baseline.     Cranial Nerves: Cranial nerves are intact. No cranial nerve deficit, dysarthria or facial asymmetry.     Sensory: Sensation is intact. No sensory deficit.     Motor: Motor function is intact. No weakness.     Coordination: Coordination is intact. Finger-Nose-Finger Test normal.     Gait: Gait is intact. Gait normal.  Psychiatric:        Mood and Affect: Mood normal.        Behavior: Behavior normal.        Thought Content: Thought content normal.        Judgment: Judgment normal.    ED Results / Procedures / Treatments   Labs (all labs ordered are listed, but only abnormal results are displayed) Labs Reviewed  I-STAT VENOUS BLOOD GAS, ED - Abnormal; Notable for the following components:      Result Value   pH, Ven 7.222 (*)    pCO2, Ven 67.8 (*)    pO2, Ven 63.0 (*)    HCT 49.0 (*)    Hemoglobin 16.7 (*)    All other components within normal limits  RESP PANEL BY RT-PCR (FLU A&B, COVID) ARPGX2  BASIC METABOLIC PANEL  CBC WITH DIFFERENTIAL/PLATELET  BRAIN NATRIURETIC PEPTIDE  TROPONIN I (HIGH SENSITIVITY)    EKG None  Radiology No results found.  Procedures Procedures   Medications Ordered in ED Medications  albuterol (PROVENTIL,VENTOLIN) solution continuous neb (  20 mg/hr Nebulization Given 08/10/21 1241)  methylPREDNISolone sodium succinate (SOLU-MEDROL) 125 mg/2 mL injection 125  mg (125 mg Intravenous Given 08/10/21 1243)    ED Course  I have reviewed the triage vital signs and the nursing notes.  Pertinent labs & imaging results that were available during my care of the patient were reviewed by me and considered in my medical decision making (see chart for details).    MDM Rules/Calculators/A&P                         [name] [name] is a 61 y.o. female with a past medical history of COPD on home oxygen that is presenting for shortness of breath.  On arrival patient is in respiratory distress.  She received 2 g of mag, Solu-Medrol, Atrovent, albuterol with EMS.  She states that her shortness of breath started acutely this morning after getting upset about her sister being in the hospital.  She was started on 15 L nonrebreather and switched to BiPAP.  Bedside echo showed no pericardial effusion and appropriate ejection fraction.  No pulmonary edema seen on bedside ultrasound of the lungs.  Patient was given a continuous nebulizer, Solu-Medrol.  Initial blood gas showed a pH of 7.22 and CO2 of 67.8, bicarb of 27.9.  Patient currently in a respiratory acidosis.  Patient's chest x-ray showed new bandlike opacity in the medial right lower lobe and retrocardiac opacity in the left lower lobe could represent atelectasis versus multifocal pneumonia.  She was started on Rocephin and azithromycin. Her troponin was negative. BNP unremarkable. Covid negative. CBC and BMP unremarkable.   On reevaluation the patient's respiratory status has improved.  She is no longer respiratory distress.  She is able to have a conversation.  At this time concern for COPD exacerbation secondary to pneumonia.   Repeat blood gas ordered.   Patient admitted to internal medicine service.   Patient states compliance and understanding of the plan. I explained labs and imaging to the patient. No further questions at this time from the patient.  The plan for this patient was discussed with Dr. Reather Converse,  who voiced agreement and who oversaw evaluation and treatment of this patient.   Final Clinical Impression(s) / ED Diagnoses Final diagnoses:  Acute exacerbation of chronic obstructive pulmonary disease (COPD) ([place])  Hypoxia  Community acquired pneumonia, bilateral    Rx / DC Orders ED Discharge Orders     None        Doretha Sou, MD 08/10/21 2117    Elnora Morrison, MD 08/11/21 2355

## 2021-08-11 DIAGNOSIS — E872 Acidosis: Secondary | ICD-10-CM

## 2021-08-11 DIAGNOSIS — E8729 Other acidosis: Secondary | ICD-10-CM

## 2021-08-11 DIAGNOSIS — J189 Pneumonia, unspecified organism: Secondary | ICD-10-CM

## 2021-08-11 DIAGNOSIS — J9602 Acute respiratory failure with hypercapnia: Secondary | ICD-10-CM | POA: Diagnosis not present

## 2021-08-11 LAB — CBC WITH DIFFERENTIAL/PLATELET
Abs Immature Granulocytes: 0.03 10*3/uL (ref 0.00–0.07)
Basophils Absolute: 0 10*3/uL (ref 0.0–0.1)
Basophils Relative: 0 %
Eosinophils Absolute: 0 10*3/uL (ref 0.0–0.5)
Eosinophils Relative: 0 %
HCT: 42 % (ref 36.0–46.0)
Hemoglobin: 14 g/dL (ref 12.0–15.0)
Immature Granulocytes: 0 %
Lymphocytes Relative: 13 %
Lymphs Abs: 1.1 10*3/uL (ref 0.7–4.0)
MCH: 28.7 pg (ref 26.0–34.0)
MCHC: 33.3 g/dL (ref 30.0–36.0)
MCV: 86.2 fL (ref 80.0–100.0)
Monocytes Absolute: 0.2 10*3/uL (ref 0.1–1.0)
Monocytes Relative: 2 %
Neutro Abs: 6.8 10*3/uL (ref 1.7–7.7)
Neutrophils Relative %: 85 %
Platelets: 205 10*3/uL (ref 150–400)
RBC: 4.87 MIL/uL (ref 3.87–5.11)
RDW: 14.6 % (ref 11.5–15.5)
WBC: 8 10*3/uL (ref 4.0–10.5)
nRBC: 0 % (ref 0.0–0.2)

## 2021-08-11 LAB — BASIC METABOLIC PANEL
Anion gap: 7 (ref 5–15)
BUN: 15 mg/dL (ref 8–23)
CO2: 22 mmol/L (ref 22–32)
Calcium: 8.5 mg/dL — ABNORMAL LOW (ref 8.9–10.3)
Chloride: 105 mmol/L (ref 98–111)
Creatinine, Ser: 0.75 mg/dL (ref 0.44–1.00)
GFR, Estimated: >60 mL/min (ref >60)
Glucose, Bld: 163 mg/dL — ABNORMAL HIGH (ref 70–99)
Potassium: 4.8 mmol/L (ref 3.5–5.1)
Sodium: 134 mmol/L — ABNORMAL LOW (ref 135–145)

## 2021-08-11 LAB — HIV ANTIBODY (ROUTINE TESTING W REFLEX): HIV Screen 4th Generation wRfx: NONREACTIVE

## 2021-08-11 MED ORDER — CEFPODOXIME PROXETIL 200 MG PO TABS: 200.0000 mg | ORAL_TABLET | Freq: Two times a day (BID) | ORAL | 0 refills | Status: AC

## 2021-08-11 MED ORDER — DEXTROSE 5 % IV SOLN
250.0000 mg | INTRAVENOUS | Status: DC
  Administered 2021-08-11: 250 mg via INTRAVENOUS
  Filled 2021-08-11: qty 250

## 2021-08-11 MED ORDER — AZITHROMYCIN 500 MG PO TABS: 250.0000 mg | ORAL_TABLET | Freq: Every day | ORAL | 0 refills | Status: AC

## 2021-08-11 MED ORDER — CEFTRIAXONE SODIUM 1 G IJ SOLR
1.0000 g | INTRAMUSCULAR | Status: DC
  Administered 2021-08-11: 1 g via INTRAVENOUS
  Filled 2021-08-11: qty 10

## 2021-08-11 NOTE — Discharge Summary (Signed)
Name: [name] [name] MRN: [id number] DOB: November 22, 1961 60 y.o. PCP: Jilda Panda, MD  Date of Admission: 08/10/2021 12:12 PM Date of Discharge:  08/11/2021 Attending Physician: Dr. Angelia Mould  DISCHARGE DIAGNOSIS:  Primary Problem: Acute hypercapnic respiratory failure [facility])   Hospital Problems: Principal Problem:   Acute hypercapnic respiratory failure ([place]) Active Problems:   COPD with emphysema ([place])   Respiratory acidosis   CAP (community acquired pneumonia)    DISCHARGE MEDICATIONS:   Allergies as of 08/11/2021   No Known Allergies      Medication List     TAKE these medications    acetaminophen 325 MG tablet Commonly known as: TYLENOL Take 325-650 mg by mouth every 6 (six) hours as needed (pain).   albuterol (2.5 MG/3ML) 0.083% nebulizer solution Commonly known as: PROVENTIL Take 3 mLs (2.5 mg total) by nebulization every 6 (six) hours as needed for wheezing or shortness of breath.   albuterol 108 (90 Base) MCG/ACT inhaler Commonly known as: VENTOLIN HFA Inhale 2 puffs into the lungs every 6 (six) hours as needed for wheezing or shortness of breath.   amLODipine 5 MG tablet Commonly known as: NORVASC Take 5 mg by mouth every morning.   azithromycin 500 MG tablet Commonly known as: Zithromax Take 0.5 tablets (250 mg total) by mouth daily for 3 days. Take 1 tablet daily for 3 days.   buPROPion ER 100 MG 12 hr tablet Commonly known as: WELLBUTRIN SR Take 100 mg by mouth 2 (two) times daily.   cefpodoxime 200 MG tablet Commonly known as: VANTIN Take 1 tablet (200 mg total) by mouth 2 (two) times daily for 3 days.   colchicine 0.6 MG tablet Take 1 tablet (0.6 mg total) by mouth daily. Take 2 tablets today as soon as you pick up the prescription.  And then 1 more tablet tonight before bed.  If your pain is persisting when you return tomorrow morning continue to take 1 tablet in the morning and 1 at night until your pain resolves.   denosumab 60 MG/ML Sosy  injection Commonly known as: PROLIA Inject 60 mg into the skin every 6 (six) months.   famotidine 20 MG tablet Commonly known as: Pepcid One after supper   methocarbamol 500 MG tablet Commonly known as: ROBAXIN Take 1 tablet (500 mg total) by mouth 2 (two) times daily.   multivitamin with minerals Tabs tablet Take 1 tablet by mouth every morning.   MYRBETRIQ PO Take 1 tablet by mouth daily.   OXYGEN Inhale 3 L into the lungs continuous.   Spiriva Respimat 1.25 MCG/ACT Aers Generic drug: Tiotropium Bromide Monohydrate Inhale 1 puff into the lungs every morning.   Stiolto Respimat 2.5-2.5 MCG/ACT Aers Generic drug: Tiotropium Bromide-Olodaterol Inhale 2 puffs into the lungs daily.   VISINE OP Place 1 drop into both eyes as needed (for dry eyes).         DISPOSITION AND FOLLOW-UP:  Ms.Cleatus F Mosco was discharged from [facility] in Stable condition. At the hospital follow up visit please address:  Follow-up Recommendations: Consults: Pulmonary medicine Labs:  none Studies: none New medications: azithromycin '250mg'$  x3 days and cefpodoxime '200mg'$  BID x 3 days to complete 5 day course of CAP coverage  Follow-up Appointments: Advised to follow up with pulmonology on 9/26 as previously scheduled   Follow-up Information     Jilda Panda, MD Follow up in 2 week(s).   Specialty: Internal Medicine Why: hospital follow up Contact information: [address]  [place] [address] [phone]                 HOSPITAL COURSE:  Patient Summary: [name] [name] is a 60 yo female with COPD on 2L [place], osteoporosis on prolia, GERD, diverticulosis, and tobacco use presenting for worsening shortness of breath and admitted for acute on chronic hypercapnic respiratory failure with respiratory acidosis.     #Acute on chronic hypercapnic respiratory failure with respiratory acidosis #COPD with emphysema #Pneumonia Follows with Dr. Melvyn Novas, pulmonology  and is on 3L [place] as needed at baseline. Patient presented with worsening shortness of breath at rest and was placed on 15 L non-rebreather and switched to BiPAP in the ED. She denies any cough or sputum production at this time. Patient also given continuous nebulizer, Solumedrol, Rocephin, and azithromycin in the ED. Initial VBG significant for pH 7.22, CO2 67.8, and bicarb of 28. Repeat VBG 3 hours later after patient placed on BiPAP improved to pH 7.27, CO2 60.6, and bicarb 28. BNP within normal limits and troponin x2 also not elevated. BiPAP has improved the patient's symptoms overall. Patient made NPO while she is wearing BiPAP and will resumed a regular diet when she came off BiPAP. Repeat VBG improved to pH 7.34, CO2 44, and bicarb of 24. Patient was sating well and mentating well on 3L [place], which is her baseline at home. Received 2nd dose of azithromycin and rocephin in the ED on 8/27 and will be discharged on 3 days of azithromycin and cefpodoxime to complete a 5 day course of antibiotics for CAP coverage. Her breathing is improved and she continues to deny any chest tightness, cough, or sputum production. Also remained febrile with no leukocytosis. Patient also worked with PT and is able to ambulate well, with her baseline of 3L [place]. Stable to be discharged to home and advised to follow up with pulmonologist in 1 month as previously scheduled.  #Tobacco use disorder Patient has ~45+ pack year history. She continues to smoke cigarettes despite having COPD and requiring supplemental O2 at home, which is likely contributing to her COPD exacerbation and worsening respiratory symptoms at this time. Patient could benefit from [facility] consult for tobacco cessation resources and she expresses understanding of the benefits of quitting smoking. Ordered nicotine patch while patient was hospitalized, but she does not like to use these.   #Hypertension Patient takes amlodipine '5mg'$  at home daily. BP elevated on arrival to  186/113. Blood pressures improved with SBP in the 130s in the ED.  #Depression Takes bupropion '100mg'$  BID at home for depression.   DISCHARGE INSTRUCTIONS:   Discharge Instructions     Call MD for:  difficulty breathing, headache or visual disturbances   Complete by: As directed    Diet - low sodium heart healthy   Complete by: As directed    Increase activity slowly   Complete by: As directed       FOLLOW-UP INSTRUCTIONS:  Thank you for allowing [place] to be part of your care. You were hospitalized for Acute hypercapnic respiratory failure ([place]).  Please follow up with the following providers: A. Jilda Panda, MD, [address] / [place] [address], [phone]  B. Dr. Melvyn Novas, pulmonology on 9/26 as previously scheduled  Please note these changes made to your medications:   A. Medications to continue: Current Meds  Medication Sig   acetaminophen (TYLENOL) 325 MG tablet Take 325-650 mg by mouth every 6 (six) hours as needed (pain).   albuterol (PROVENTIL) (2.5 MG/3ML) 0.083%  nebulizer solution Take 3 mLs (2.5 mg total) by nebulization every 6 (six) hours as needed for wheezing or shortness of breath.   albuterol (VENTOLIN HFA) 108 (90 Base) MCG/ACT inhaler Inhale 2 puffs into the lungs every 6 (six) hours as needed for wheezing or shortness of breath.   amLODipine (NORVASC) 5 MG tablet Take 5 mg by mouth every morning.   azithromycin (ZITHROMAX) 500 MG tablet Take 0.5 tablets (250 mg total) by mouth daily for 3 days. Take 1 tablet daily for 3 days.   buPROPion (WELLBUTRIN SR) 100 MG 12 hr tablet Take 100 mg by mouth 2 (two) times daily.     cefpodoxime (VANTIN) 200 MG tablet Take 1 tablet (200 mg total) by mouth 2 (two) times daily for 3 days.   denosumab (PROLIA) 60 MG/ML SOSY injection Inject 60 mg into the skin every 6 (six) months.   Multiple Vitamin (MULTIVITAMIN WITH MINERALS) TABS tablet Take 1 tablet by mouth every morning.   OXYGEN Inhale 3 L into the lungs continuous.    Tetrahydrozoline HCl (VISINE OP) Place 1 drop into both eyes as needed (for dry eyes).   Tiotropium Bromide Monohydrate (SPIRIVA RESPIMAT) 1.25 MCG/ACT AERS Inhale 1 puff into the lungs every morning.      B. Medications to start:     azithromycin (ZITHROMAX) 500 MG tablet, Take 0.5 tablets (250 mg total) by mouth daily for 3 days.    cefpodoxime (VANTIN) 200 MG tablet, Take 1 tablet (200 mg total) by mouth 2 (two) times daily for 3 days.   Please make sure to continue taking your antibiotics, starting on 8/28 and finishing on 8/30.   Please call our clinic if you have any questions or concerns, we may be able to help and keep you from a long and expensive emergency room wait. Our clinic and after hours phone number is [phone], the best time to call is Monday through Friday 9 am to 4 pm but there is always someone available 24/7 if you have an emergency. If you need medication refills please notify your pharmacy one week in advance and they will send [place] a request.      SUBJECTIVE:  Ms. Ehman was seen and examined on the day of discharge. Her breathing is much improved and she is stable on her baseline of 3L [place]. The patient was able to ambulate well with PT and had no episodes of oxygen desaturations while using her supplemental O2. The patient feels like she is back to her normal self and is ready to get home. Advised the patient to take antibiotics for 3 more days, as she likely had a pneumonia which exacerbated her symptoms at this time. Also strongly encouraged the patient to quit all tobacco use, for which she expressed understanding.   Discharge Vitals:   BP (!) 140/99   Pulse 97   Temp 98.5 F (36.9 C) (Oral)   Resp (!) 24   Wt 59.9 kg   SpO2 97%   BMI 22.32 kg/m   OBJECTIVE:  General: Pleasant, well-appearing female laying in bed. No acute distress. Head: Normocephalic. Atraumatic. CV: RRR. No murmurs, rubs, or gallops. No LE edema Pulmonary: Lungs CTAB. Normal  effort and no labored breathing on 3L [place].  Abdominal: Soft, nontender, nondistended. Normal bowel sounds. Extremities: Palpable radial and DP pulses. Normal ROM. Skin: Warm and dry. No obvious rash or lesions. Neuro: A&Ox3. Moves all extremities. Normal sensation. No focal deficit. Psych: Normal mood and affect   Pertinent  Labs, Studies, and Procedures:  CBC Latest Ref Rng & Units 08/11/2021 08/10/2021 08/10/2021  WBC 4.0 - 10.5 K/uL 8.0 - -  Hemoglobin 12.0 - 15.0 g/dL 14.0 16.0(H) 16.0(H)  Hematocrit 36.0 - 46.0 % 42.0 47.0(H) 47.0(H)  Platelets 150 - 400 K/uL 205 - -    CMP Latest Ref Rng & Units 08/11/2021 08/10/2021 08/10/2021  Glucose 70 - 99 mg/dL 163(H) - -  BUN 8 - 23 mg/dL 15 - -  Creatinine 0.44 - 1.00 mg/dL 0.75 - -  Sodium 135 - 145 mmol/L 134(L) 138 138  Potassium 3.5 - 5.1 mmol/L 4.8 4.3 4.2  Chloride 98 - 111 mmol/L 105 - -  CO2 22 - 32 mmol/L 22 - -  Calcium 8.9 - 10.3 mg/dL 8.5(L) - -  Total Protein 6.5 - 8.1 g/dL - - -  Total Bilirubin 0.3 - 1.2 mg/dL - - -  Alkaline Phos 38 - 126 U/L - - -  AST 15 - 41 U/L - - -  ALT 14 - 54 U/L - - -    DG Chest Portable 1 View  Result Date: 08/10/2021 CLINICAL DATA:  Respiratory distress. EXAM: PORTABLE CHEST 1 VIEW COMPARISON:  Chest radiograph 05/25/2020; CT chest 09/21/2019 FINDINGS: Of note, the right costophrenic angle is incompletely visualized. Severe emphysematous changes in the upper lobes, right greater than left. Stable scarring at the lung bases. New bandlike opacity in the medial right lower lobe and retrocardiac opacity in the left lower lobe. No pneumothorax. Heart is normal in size. Old healed fractures of the right posterior fifth rib. IMPRESSION: 1. New bandlike opacity in the medial right lower lobe and retrocardiac opacity in the left lower lobe could represent atelectasis versus multifocal pneumonia in the appropriate clinical setting. 2. Severe emphysema with stable fibrotic changes at the lung bases. No  pneumothorax. Emphysema (ICD10-J43.9). Electronically Signed   By: Ileana Roup M.D.   On: 08/10/2021 12:57     Signed: Buddy Duty, D.O.  Internal Medicine Resident, PGY-1 [facility]  Pager: [phone] 9:57 AM, 08/11/2021

## 2021-08-11 NOTE — ED Notes (Signed)
Copd/ bipap

## 2021-08-11 NOTE — ED Notes (Signed)
Placed Breakfast Order 

## 2021-08-11 NOTE — Discharge Instructions (Addendum)
FOLLOW-UP INSTRUCTIONS:  Thank you for allowing [place] to be part of your care. You were hospitalized for Acute hypercapnic respiratory failure ([place]).  Please follow up with the following providers: A. Jilda Panda, MD, [id number] / [place] [id number], [phone]  B. Dr. Melvyn Novas, pulmonology on 9/26 as previously scheduled  Please note these changes made to your medications:   A. Medications to continue: Current Meds  Medication Sig   acetaminophen (TYLENOL) 325 MG tablet Take 325-650 mg by mouth every 6 (six) hours as needed (pain).   albuterol (PROVENTIL) (2.5 MG/3ML) 0.083% nebulizer solution Take 3 mLs (2.5 mg total) by nebulization every 6 (six) hours as needed for wheezing or shortness of breath.   albuterol (VENTOLIN HFA) 108 (90 Base) MCG/ACT inhaler Inhale 2 puffs into the lungs every 6 (six) hours as needed for wheezing or shortness of breath.   amLODipine (NORVASC) 5 MG tablet Take 5 mg by mouth every morning.   azithromycin (ZITHROMAX) 500 MG tablet Take 0.5 tablets (250 mg total) by mouth daily for 3 days. Take 1 tablet daily for 3 days.   buPROPion (WELLBUTRIN SR) 100 MG 12 hr tablet Take 100 mg by mouth 2 (two) times daily.     cefpodoxime (VANTIN) 200 MG tablet Take 1 tablet (200 mg total) by mouth 2 (two) times daily for 3 days.   denosumab (PROLIA) 60 MG/ML SOSY injection Inject 60 mg into the skin every 6 (six) months.   Multiple Vitamin (MULTIVITAMIN WITH MINERALS) TABS tablet Take 1 tablet by mouth every morning.   OXYGEN Inhale 3 L into the lungs continuous.   Tetrahydrozoline HCl (VISINE OP) Place 1 drop into both eyes as needed (for dry eyes).   Tiotropium Bromide Monohydrate (SPIRIVA RESPIMAT) 1.25 MCG/ACT AERS Inhale 1 puff into the lungs every morning.      B. Medications to start:     azithromycin (ZITHROMAX) 500 MG tablet, Take 0.5 tablets (250 mg total) by mouth daily for 3 days.    cefpodoxime (VANTIN) 200 MG tablet, Take 1 tablet (200 mg total) by  mouth 2 (two) times daily for 3 days.   Please make sure to continue taking your antibiotics, starting on 8/28 and finishing on 8/30.   Please call our clinic if you have any questions or concerns, we may be able to help and keep you from a long and expensive emergency room wait. Our clinic and after hours phone number is [phone], the best time to call is Monday through Friday 9 am to 4 pm but there is always someone available 24/7 if you have an emergency. If you need medication refills please notify your pharmacy one week in advance and they will send [place] a request.   Call Quitline[facility] Get free tobacco cessation help 24/7 in several ways:  1-800-QUIT-NOW 843-456-3592); Espaol: 1-855-Djelo-Ya HD:1601594) o para ms informacin haga clic aqu; Interpretation services available for many languages; Text READY to 200-400 to register via text Register online (en espaol) TTY: [place] Quitline: Call 888-7AI-QUIT (209) [phone])

## 2021-08-11 NOTE — Progress Notes (Signed)
OT Cancellation Note  Patient Details Name: ANNAMARIA VITTONE MRN: [id number] DOB: [birth year]   Cancelled Treatment:    Reason Eval/Treat Not Completed: OT screened, no needs identified, will sign off.  Spoke with PT who reports pt is mod I.  Nilsa Nutting., OTR/L Acute Rehabilitation Services Pager [phone] Office [phone]   Lucille Passy M 08/11/2021, 9:28 AM

## 2021-08-11 NOTE — Progress Notes (Signed)
SATURATION QUALIFICATIONS: (This note is used to comply with regulatory documentation for home oxygen)  Patient Saturations on Room Air at Rest = 90%  Patient Saturations on Room Air while Ambulating = 74%  Patient Saturations on 3 Liters of oxygen while Ambulating = 94%  Please briefly explain why patient needs home oxygen: Pt desaturates on RA with gait.  She does need 3 L O2 [place] for mobility.  She reports she wears 3 L O2 [place] all the time at home prior to admission.   Verdene Lennert, PT, DPT  Acute Rehabilitation Ortho Tech Supervisor [phone] pager [phone] office

## 2021-08-11 NOTE — Evaluation (Signed)
Physical Therapy Evaluation Patient Details Name: [name] [name] MRN: [id number] DOB: 1961/12/13 Today's Date: 08/11/2021   History of Present Illness  60 y.o. female admitted on 08/10/21 foracute on chronic hypercapnic respiratory failure with respiratory acidosis.  Pt with significant PMH of anemia, anxiety, arthritis (OA), blood dyscrasia, COPD, sickle cell trait, (+) smoker.  Clinical Impression  Pt was able to ambulate in the hallway at a mod I level, no obvious balance deficits noted.  Pt did desaturate on RA with gait, but reports she wears 3 L O2 [place] all the time at home (not PRN).  See separate O2 note for details.  Pt is safe to d/c home from a mobility standpoint.  She does not need any further acute or follow up PT at this time.  PT to sign off.     Follow Up Recommendations No PT follow up    Equipment Recommendations  None recommended by PT    Recommendations for Other Services       Precautions / Restrictions Precautions Precautions: Other (comment) Precaution Comments: monitor O2 sats with activity      Mobility  Bed Mobility Overal bed mobility: Modified Independent                  Transfers Overall transfer level: Modified independent                  Ambulation/Gait Ambulation/Gait assistance: Modified independent (Device/Increase time) Gait Distance (Feet): 100 Feet Assistive device: 4-wheeled walker Gait Pattern/deviations: WFL(Within Functional Limits)     General Gait Details: pt reports she is a furniture walker at times at home, and is more wobbly than she used to be.  She reports using 3 L O2 [place] all the time at home "I have a long cord"`  Science writer    Modified [place] (Stroke Patients Only)       Balance Overall balance assessment: No apparent balance deficits (not formally assessed)                                           Pertinent Vitals/Pain Pain Assessment: No/denies  pain    Home Living Family/patient expects to be discharged to:: Private residence Living Arrangements: Alone Available Help at Discharge: Family;Home health;Available PRN/intermittently Type of Home: House                Prior Function Level of Independence: Independent               Hand Dominance   Dominant Hand: Right    Extremity/Trunk Assessment   Upper Extremity Assessment Upper Extremity Assessment: Overall WFL for tasks assessed    Lower Extremity Assessment Lower Extremity Assessment: Overall WFL for tasks assessed    Cervical / Trunk Assessment Cervical / Trunk Assessment: Normal  Communication   Communication: No difficulties  Cognition Arousal/Alertness: Awake/alert Behavior During Therapy: WFL for tasks assessed/performed Overall Cognitive Status: Within Functional Limits for tasks assessed                                        General Comments General comments (skin integrity, edema, etc.): see seaprate O2 note confirming the need for 3 L O2 [place] during mobility.  Exercises     Assessment/Plan    PT Assessment Patent does not need any further PT services  PT Problem List         PT Treatment Interventions      PT Goals (Current goals can be found in the Care Plan section)  Acute Rehab PT Goals Patient Stated Goal: to go home later if she can and not come back PT Goal Formulation: All assessment and education complete, DC therapy    Frequency     Barriers to discharge        Co-evaluation               AM-PAC PT "6 Clicks" Mobility  Outcome Measure Help needed turning from your back to your side while in a flat bed without using bedrails?: None Help needed moving from lying on your back to sitting on the side of a flat bed without using bedrails?: None Help needed moving to and from a bed to a chair (including a wheelchair)?: None Help needed standing up from a chair using your arms (e.g., wheelchair  or bedside chair)?: None Help needed to walk in hospital room?: None Help needed climbing 3-5 steps with a railing? : None 6 Click Score: 24    End of Session Equipment Utilized During Treatment: Oxygen Activity Tolerance: Patient tolerated treatment well Patient left: in bed;Other (comment) (seated EOB)   PT Visit Diagnosis: Difficulty in walking, not elsewhere classified (R26.2)    Time: [id number] PT Time Calculation (min) (ACUTE ONLY): 12 min   Charges:   PT Evaluation $PT Eval Moderate Complexity: 1 Mod     Verdene Lennert, PT, DPT  Acute Rehabilitation Ortho Tech Supervisor [phone] pager [phone] office

## 2021-09-05 ENCOUNTER — Other Ambulatory Visit: Payer: Self-pay

## 2021-09-05 ENCOUNTER — Ambulatory Visit (INDEPENDENT_AMBULATORY_CARE_PROVIDER_SITE_OTHER): Payer: Medicare Other | Admitting: Dermatology

## 2021-09-05 DIAGNOSIS — L661 Lichen planopilaris: Secondary | ICD-10-CM | POA: Diagnosis not present

## 2021-09-05 MED ORDER — CLOBETASOL PROPIONATE 0.05 % EX FOAM: Freq: Every day | CUTANEOUS | 2 refills | Status: DC

## 2021-09-06 ENCOUNTER — Encounter: Payer: Self-pay | Admitting: Dermatology

## 2021-09-06 ENCOUNTER — Telehealth: Payer: Self-pay | Admitting: Dermatology

## 2021-09-06 ENCOUNTER — Other Ambulatory Visit: Payer: Self-pay | Admitting: Emergency Medicine

## 2021-09-06 NOTE — Progress Notes (Signed)
   New Patient   Subjective  [name] [name] is a 60 y.o. female who presents for the following: Alopecia (Here for hair loss x last 2 months no major changes in the last 2 months. Did recently start new blood pressure medication.  No treatment. Patient is anemic. No treatments per patient. ).  Hair loss Location:  Duration:  Quality:  Associated Signs/Symptoms: Modifying Factors:  Severity:  Timing: Context:    The following portions of the chart were reviewed this encounter and updated as appropriate:  Tobacco  Allergies  Meds  Problems  Med Hx  Surg Hx  Fam Hx      Objective  Well appearing patient in no apparent distress; mood and affect are within normal limits. Mid Frontal Scalp Perhaps 70% loss of hair density across broad stripe frontal hairline; 30 to 40% loss on frontal part.  History of itching but no obvious scalp inflammation.  No obvious follicular dropout or fibrosis.  No changes ears, face, nails.  If the history that this is really only several months old is accurate, I suspect this is a combination of 2 or 3 forms of hair loss.  She may have a telogen effluvium related to either to her COVID history or to her new antihypertensive.  Currently the her blood test is negative so I do not believe this is an active process.  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  We discussed essentially all treatment options including topical minoxidil, the new report of minidose oral minoxidil, finasteride, and injections of either triamcinolone and/or protein rich plasma.    A focused examination was performed including scalp,, face, ears, neck, nails.. Relevant physical exam findings are noted in the Assessment and Plan.   Assessment & Plan  Frontal fibrosing alopecia Mid Frontal Scalp  We will initially try a full potency topical anti-inflammatory like clobetasol foam daily for a minimum of 55months.  If there is no  improvement, we will discuss a similar trial adding oral Plaquenil.  We will also schedule a distant consultation with Dr. Lois Huxley, chair woman of the Department of dermatology at [facility].  clobetasol (OLUX) 0.05 % topical foam - Mid Frontal Scalp Apply topically daily.  Related Procedures Ambulatory referral to Dermatology

## 2021-09-06 NOTE — Telephone Encounter (Signed)
Patient is having trouble getting her prescription for Clobetasol at [facility].  Patient says that it's going to be $91.00 after insurance.  Patient will try just using the [facility] card and call if any further problems.

## 2021-09-10 ENCOUNTER — Other Ambulatory Visit: Payer: Self-pay

## 2021-09-10 ENCOUNTER — Ambulatory Visit: Payer: Medicare Other | Admitting: Internal Medicine

## 2021-09-10 ENCOUNTER — Encounter: Payer: Self-pay | Admitting: Internal Medicine

## 2021-09-10 ENCOUNTER — Ambulatory Visit (INDEPENDENT_AMBULATORY_CARE_PROVIDER_SITE_OTHER): Payer: Medicare Other | Admitting: Internal Medicine

## 2021-09-10 VITALS — BP 120/72 | HR 91 | Temp 98.6°F | Ht 63.5 in | Wt 136.6 lb

## 2021-09-10 DIAGNOSIS — F17210 Nicotine dependence, cigarettes, uncomplicated: Secondary | ICD-10-CM | POA: Diagnosis not present

## 2021-09-10 DIAGNOSIS — J9611 Chronic respiratory failure with hypoxia: Secondary | ICD-10-CM

## 2021-09-10 DIAGNOSIS — J449 Chronic obstructive pulmonary disease, unspecified: Secondary | ICD-10-CM | POA: Diagnosis not present

## 2021-09-10 MED ORDER — STIOLTO RESPIMAT 2.5-2.5 MCG/ACT IN AERS: 2.0000 | INHALATION_SPRAY | Freq: Every day | RESPIRATORY_TRACT | 0 refills | Status: DC

## 2021-09-10 NOTE — Progress Notes (Signed)
[name] [name], female    DOB: 07/11/1961,    MRN: [id number]   Synopsis: First referred in 2017 for evaluation of COPD/ active smoker 2016 pulmonary function testing from [facility] showed FEV1 of 870 mL (37% predicted) consistent with severe airflow obstruction 2015 CT scan from [facility] showed severe panlobular emphysema She is prescribed 2 L O2 qHS       06/10/2018  Acute extended  ov/Rane Dumm re: post ER eval  06/08/18 dx aecopd/bronchitis rx zpak  / pt with GOLD III criteria / still smoking Chief Complaint  Patient presents with   Acute Visit    hemoptysis since 06/08/18.    Dyspnea:  MMRC2 = can't walk a nl pace on a flat grade s sob but does fine slow and flat does [employer] 0k s 02 and that hasn't changed since onset of hemoptysis Cough: acute onset  on 06/08/18 mostly mucoid with streaky hemoptysis assoc with lots of nasal congestion but no purulent / bloody d/c  SABA use: has it and says helps but hfa at 0% effective baseline technique rec Prednisone 10 mg take  4 each am x 2 days,   2 each am x 2 days,  1 each am x 2 days and stop  Finish your antibiotics  Try change spiriva to the respimat 1.25  X 4 pffs each am - no change on serevent for now  Work on inhaler technique: e only use your albuterol (PROVENTIL)  as a rescue medication   05/25/2020  Acute extended ov/Levenia Skalicky re: cough / still smoking / maint on serevent / spriva smi and s/p 2nd pfizer vaccine 04/10/20  Chief Complaint  Patient presents with   Acute Visit    COPD, more coughing 1 month  Dyspnea:  Worse with cough x 1 month Cough: slt green with lump in throat "all the time  Sleeping: occ wakes up  SABA use: avg maybe 2-3 per week  02: 2lpm rec Stop serevent and spiriva and start stiolto 2 puffs each am  Prednisone 10 mg take  4 each am x 2 days,   2 each am x 2 days,  1 each am x 2 days and stop  zpak  Pantoprazole (protonix) 40 mg   Take  30-60 min before first meal of the day and Pepcid (famotidine)  20 mg one after  supper  until return to office  The key is to stop smoking completely before smoking completely stops you!   NP recs 06/13/20 Continue Stiolto 2 puffs once daily in the morning Start taking mucinex twice daily for 2 weeks  Use 1-2L oxygen as needed on exertion to keep O2 level > 88-90% Strongly encourage you quit smoking- recommend you taper down amount and pick a quit date GERD: Stop Protonix Continue Pepcid at bedtime       09/26/2020  f/u ov/Brayam Boeke re:   Back on serevent and spiriva "stiolto stopped workingTour manager Complaint  Patient presents with   Follow-up    denies any issues with breathing   Dyspnea: [employer] does fine  Cough: min am mucoid  Sleeping: no respiratory complaints flat bed/ one pillow  SABA use: 2 x weekly 02: 2lpm at bedtime / no portable  Rec Plan A = Automatic = Always=    serevent twice daily and spiriva 1.25 2puff each a  Plan B = Backup (to supplement plan A, not to replace it) Only use your albuterol inhaler  as a rescue medication  Plan C =  Crisis (instead of Plan B but only if Plan B stops working) - only use your albuterol nebulizer if you first try Plan B and it fails to help > ok to use the nebulizer up to every 4 hours but if start needing it regularly call for immediate appointment Plan D = Doctor - call me if ABC not working The key is to stop smoking completely before smoking completely stops you!  06/07/21 NP Fransico Selby Slovacek  COPD: - Stop Spiriva - Start Stiolto - take 2 puffs every morning  Chronic respiratory failure: - You need to wear 2L oxygen while at home and at night (use 3L with portable concentrator)  Orders: - Renew oxygen with [employer], needs 3L and POC   09/10/2021  f/u ov/Alireza Pollack re: GOLD 3  COPD  still smoking  maint on stiolto   Chief Complaint  Patient presents with   Follow-up    COPD  Dyspnea:  fine at [employer], [employer] parking  Cough: none  Sleeping: flat bed, one pillow  SABA use: varies 1-2 puff proair, never  02: 3lpm  hs concentrator  Covid status:   vax 3/4 planned    No obvious day to day or daytime variability or assoc excess/ purulent sputum or mucus plugs or hemoptysis or cp or chest tightness, subjective wheeze or overt sinus or hb symptoms.   Sleeping  without nocturnal  or early am exacerbation  of respiratory  c/o's or need for noct saba. Also denies any obvious fluctuation of symptoms with weather or environmental changes or other aggravating or alleviating factors except as outlined above   No unusual exposure hx or h/o childhood pna/ asthma or knowledge of premature birth.  Current Allergies, Complete Past Medical History, Past Surgical History, Family History, and Social History were reviewed in [employer] record.  ROS  The following are not active complaints unless bolded Hoarseness, sore throat, dysphagia, dental problems, itching, sneezing,  nasal congestion or discharge of excess mucus or purulent secretions, ear ache,   fever, chills, sweats, unintended wt loss or wt gain, classically pleuritic or exertional cp,  orthopnea pnd or arm/hand swelling  or leg swelling, presyncope, palpitations, abdominal pain, anorexia, nausea, vomiting, diarrhea  or change in bowel habits or change in bladder habits, change in stools or change in urine, dysuria, hematuria,  rash, arthralgias, visual complaints, headache, numbness, weakness or ataxia or problems with walking or coordination,  change in mood or  memory.        Current Meds  Medication Sig   acetaminophen (TYLENOL) 325 MG tablet Take 325-650 mg by mouth every 6 (six) hours as needed (pain).   albuterol (PROVENTIL) (2.5 MG/3ML) 0.083% nebulizer solution Take 3 mLs (2.5 mg total) by nebulization every 6 (six) hours as needed for wheezing or shortness of breath.   albuterol (VENTOLIN HFA) 108 (90 Base) MCG/ACT inhaler Inhale 2 puffs into the lungs every 6 (six) hours as needed for wheezing or shortness of breath.   amLODipine  (NORVASC) 5 MG tablet Take 5 mg by mouth every morning.   buPROPion (WELLBUTRIN SR) 100 MG 12 hr tablet Take 100 mg by mouth 2 (two) times daily.     clobetasol (OLUX) 0.05 % topical foam Apply topically daily.   colchicine 0.6 MG tablet Take 1 tablet (0.6 mg total) by mouth daily. Take 2 tablets today as soon as you pick up the prescription.  And then 1 more tablet tonight before bed.  If your pain is persisting  when you return tomorrow morning continue to take 1 tablet in the morning and 1 at night until your pain resolves. (Patient taking differently: Take 0.6 mg by mouth daily. Take 2 tablets today as soon as you pick up the prescription.  And then 1 more tablet tonight before bed.  If your pain is persisting when you return tomorrow morning continue to take 1 tablet in the morning and 1 at night until your pain resolves.)   denosumab (PROLIA) 60 MG/ML SOSY injection Inject 60 mg into the skin every 6 (six) months.   famotidine (PEPCID) 20 MG tablet One after supper   methocarbamol (ROBAXIN) 500 MG tablet Take 1 tablet (500 mg total) by mouth 2 (two) times daily.   Mirabegron (MYRBETRIQ PO) Take 1 tablet by mouth daily.   Multiple Vitamin (MULTIVITAMIN WITH MINERALS) TABS tablet Take 1 tablet by mouth every morning.   OXYGEN Inhale 3 L into the lungs continuous.   SPIRIVA RESPIMAT 1.25 MCG/ACT AERS INHALE 2 PUFFS INTO THE LUNGS EVERY DAY   Tetrahydrozoline HCl (VISINE OP) Place 1 drop into both eyes as needed (for dry eyes).   Tiotropium Bromide-Olodaterol (STIOLTO RESPIMAT) 2.5-2.5 MCG/ACT AERS Inhale 2 puffs into the lungs daily.               Objective:        09/10/2021       136  09/26/2020     132 05/25/2020       131  06/10/18 129 lb 9.6 oz (58.8 kg)  03/17/18 133 lb 12.8 oz (60.7 kg)  01/08/18 138 lb (62.6 kg)    Vital signs reviewed  09/10/2021  - Note at rest 02 sats  90% on RA   General appearance:    thin amb bf nad   HEENT : pt wearing mask not removed for exam due to  covid -19 concerns.    NECK :  without JVD/Nodes/TM/ nl carotid upstrokes bilaterally   LUNGS: no acc muscle use,  Mod barrel  contour chest wall with bilateral  Distant bs s audible wheeze and  without cough on insp or exp maneuvers and mod  Hyperresonant  to  percussion bilaterally     CV:  RRR  no s3 or murmur or increase in P2, and no edema   ABD:  soft and nontender with pos mid insp Hoover's  in the supine position. No bruits or organomegaly appreciated, bowel sounds nl  MS:     ext warm without deformities, calf tenderness, cyanosis or clubbing No obvious joint restrictions   SKIN: warm and dry without lesions    NEURO:  alert, approp, nl sensorium with  no motor or cerebellar deficits apparent.          Assessment

## 2021-09-10 NOTE — Assessment & Plan Note (Signed)
Active smoker  - PFT's  04/08/2006  FEV1 1.32 (51 % ) ratio 0.53  p 8 % improvement from saba p ? prior to study with   A  FV curve classic moderate concavity   06/10/2018  After extensive coaching inhaler device  effectiveness =   50% from a baseline of 0   - 05/25/2020  After extensive coaching inhaler device,  effectiveness =    75% (poor insp flow, short Ti) > try stiolto 2 puffs each am  > preferred spiriva 1.25/ serevent  - PFT's  09/26/2020  FEV1 0.79 (38 % ) ratio 0.49  p 0 % improvement from saba p servent/spiriva 1.25 (very last dose in device) prior to study with DLCO  7.67 (38%) corrects to 2.84 (66%)  for alv volume and FV curve classically concave exp loop  - 09/10/2021  After extensive coaching inhaler device,  effectiveness =    75% (Ti too short) > continue stiolto plus approp saba   Pt is Group B in terms of symptom/risk and laba/lama therefore appropriate rx at this point >>>  Continue stiolto and approp saba  Re SABA :  I spent extra time with pt today reviewing appropriate use of albuterol for prn use on exertion with the following points: 1) saba is for relief of sob that does not improve by walking a slower pace or resting but rather if the pt does not improve after trying this first. 2) If the pt is convinced, as many are, that saba helps recover from activity faster then it's easy to tell if this is the case by re-challenging : ie stop, take the inhaler, then p 5 minutes try the exact same activity (intensity of workload) that just caused the symptoms and see if they are substantially diminished or not after saba 3) if there is an activity that reproducibly causes the symptoms, try the saba 15 min before the activity on alternate days   If in fact the saba really does help, then fine to continue to use it prn but advised may need to look closer at the maintenance regimen being used to achieve better control of airways disease with exertion.

## 2021-09-10 NOTE — Patient Instructions (Addendum)
Plan A = Automatic = Always=    Stiolto 2 puffs still each am   Work on inhaler technique:  relax and gently blow all the way out then take a nice smooth full deep breath back in, triggering the inhaler at same time you start breathing in.  Hold for up to 5 seconds if you can.   Rinse and gargle with water when done.  If mouth or throat bother you at all,  try brushing teeth/gums/tongue with arm and hammer toothpaste/ make a slurry and gargle and spit out.      Plan B = Backup (to supplement plan A, not to replace it) Only use your albuterol inhaler (Proair) as a rescue medication to be used if you can't catch your breath by resting or doing a relaxed purse lip breathing pattern.  - The less you use it, the better it will work when you need it. - Ok to use the inhaler up to 2 puffs  every 4 hours if you must but call for appointment if use goes up over your usual need - Don't leave home without it !!  (think of it like the starter fluid or spare tire for your car)   Plan C = Crisis (instead of Plan B but only if Plan B stops working) - only use your albuterol nebulizer if you first try Plan B and it fails to help > ok to use the nebulizer up to every 4 hours but if start needing it regularly call for immediate appointment  Plan D = Doctor - call me if B and C not adequate  Plan E = ER - go to ER or call 911 if all else fails    Ok to try albuterol 15 min before an activity (on alternating days)  that you know would usually make you short of breath and see if it makes any difference and if makes none then don't take albuterol after activity unless you can't catch your breath as this means it's the resting that helps, not the albuterol.  The key is to stop smoking completely before smoking completely stops you!  We will qualify you for portable 02 today      Please schedule a follow up visit in 3 months but call sooner if needed      Late add:  LDSCT advised

## 2021-09-10 NOTE — Assessment & Plan Note (Signed)
On 2lpm hs s portable 02 as of 09/26/2020  - as of 09/10/2021 3lpm hs - amb 02 study 09/10/2021 :  Required 4lpm cont to keep sats > 90% with 86% on room air   Rec: Make sure you check your oxygen saturation  at your highest level of activity  to be sure it stays over 90% and adjust  02 flow upward to maintain this level if needed but remember to turn it back to previous settings when you stop (to conserve your supply).

## 2021-09-10 NOTE — Assessment & Plan Note (Signed)
Counseled re importance of smoking cessation but did not meet time criteria for separate billing    Low-dose CT lung cancer screening is recommended for patients who are 27-62 years of age with a 30+ pack-year history of smoking, and who are currently smoking or quit <=15 years ago.  >>> advised needs screening, already in system but not scheduled   Advised also no cigs near 02  Each maintenance medication was reviewed in detail including emphasizing most importantly the difference between maintenance and prns and under what circumstances the prns are to be triggered using an action plan format where appropriate.  Total time for H and P, chart review, counseling, reviewing hfa smi/02 device(s) , directly observing portions of ambulatory 02 saturation study/ and generating customized AVS unique to this office visit / same day charting =9min

## 2021-09-28 ENCOUNTER — Telehealth: Payer: Self-pay | Admitting: *Deleted

## 2021-09-28 ENCOUNTER — Other Ambulatory Visit: Payer: Self-pay | Admitting: Internal Medicine

## 2021-09-28 DIAGNOSIS — F17210 Nicotine dependence, cigarettes, uncomplicated: Secondary | ICD-10-CM

## 2021-09-28 NOTE — Telephone Encounter (Signed)
-----   Message from Tanda Rockers, MD sent at 09/10/2021  1:30 PM EDT ----- It looks like she was scheduled by someone else  for low dose ct but hasn't had it and I think she should   If not sure she wants it refer to Eric Form

## 2021-09-28 NOTE — Telephone Encounter (Signed)
Spoke with the pt  She was agreeable to the referral to lung ca screening program  Referral was sent

## 2021-10-31 ENCOUNTER — Telehealth: Payer: Self-pay | Admitting: Dermatology

## 2021-10-31 NOTE — Telephone Encounter (Signed)
Phone call to patient to inform her that she will need to call 911 to be taken to the nearest ER for her chest pain and SOB. I also advised patient to D/C the Clobetasol Foam.  Patient aware and states that she will call 911.

## 2021-10-31 NOTE — Telephone Encounter (Signed)
Patient is calling to say that she is having side effects from Clobetasol Propionate Foam 0.05%.  Patient is chest pains, sore throat, eyes are red and runny, diarrhea, & having shortness of breath.  (Patient states she has not been around anyone that is sick.)  Patient says that the symptoms started when she began her second round of Clobetasol.

## 2021-11-14 ENCOUNTER — Other Ambulatory Visit: Payer: Self-pay | Admitting: *Deleted

## 2021-11-14 DIAGNOSIS — F17210 Nicotine dependence, cigarettes, uncomplicated: Secondary | ICD-10-CM

## 2021-11-14 DIAGNOSIS — Z87891 Personal history of nicotine dependence: Secondary | ICD-10-CM

## 2021-11-16 ENCOUNTER — Telehealth: Payer: Self-pay | Admitting: Internal Medicine

## 2021-11-16 DIAGNOSIS — J9611 Chronic respiratory failure with hypoxia: Secondary | ICD-10-CM

## 2021-11-16 NOTE — Telephone Encounter (Signed)
Community message sent to [employer]. Will update note once I get a response back.

## 2021-11-16 NOTE — Telephone Encounter (Signed)
Dondra Spry, [place], LPN; Stephannie Peters yes, she would need an order.        Previous Messages   ----- Message -----  From: Vivia Ewing, LPN  Sent: [phone]   4:04 PM EST  To: Stephannie Peters  Subject: Oxygen                                         This patient is requesting a back up tank in case her power goes out. Do we need to write an order for an additional emergency tank?   Thanks    MW please advise can we send order for patient to have an additional back up tank in home if power goes out. Thanks

## 2021-11-16 NOTE — Telephone Encounter (Signed)
That's fine

## 2021-11-19 NOTE — Telephone Encounter (Signed)
I have called and LM on VM for the pt.  Order has been sent to [employer] to get her a back up tank in case her power goes out.  She will also need to get in touch with her power company and see what she can do about being one of the first ones to have power back on as well.

## 2021-12-06 ENCOUNTER — Ambulatory Visit
Admission: RE | Admit: 2021-12-06 | Discharge: 2021-12-06 | Disposition: A | Discharge status: Home / Self Care | Payer: Medicare Other | Source: Ambulatory Visit | Attending: Acute Care | Admitting: Acute Care

## 2021-12-06 DIAGNOSIS — F17210 Nicotine dependence, cigarettes, uncomplicated: Secondary | ICD-10-CM

## 2021-12-06 DIAGNOSIS — Z87891 Personal history of nicotine dependence: Secondary | ICD-10-CM

## 2021-12-11 ENCOUNTER — Other Ambulatory Visit: Payer: Self-pay

## 2021-12-11 ENCOUNTER — Encounter: Payer: Self-pay | Admitting: Dermatology

## 2021-12-11 ENCOUNTER — Ambulatory Visit (INDEPENDENT_AMBULATORY_CARE_PROVIDER_SITE_OTHER): Payer: Medicare Other | Admitting: Dermatology

## 2021-12-11 DIAGNOSIS — L661 Lichen planopilaris: Secondary | ICD-10-CM

## 2021-12-11 DIAGNOSIS — L668 Other cicatricial alopecia: Secondary | ICD-10-CM | POA: Diagnosis not present

## 2021-12-11 MED ORDER — HYDROXYCHLOROQUINE SULFATE 200 MG PO TABS: 200.0000 mg | ORAL_TABLET | Freq: Every day | ORAL | 1 refills | Status: DC

## 2021-12-11 MED ORDER — HYDROXYCHLOROQUINE SULFATE 200 MG PO TABS: 200.0000 mg | ORAL_TABLET | Freq: Two times a day (BID) | ORAL | 1 refills | Status: DC

## 2021-12-11 NOTE — Progress Notes (Signed)
° °  Follow-Up Visit   Subjective  [name] [name] is a 60 y.o. 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 No personal or family history of non skin mole cancers, melanoma, or atypical moles. ).  Hair loss unchanged, perhaps worse on left temple area. Location:  Duration:  Quality:  Associated Signs/Symptoms: Modifying Factors:  Severity:  Timing: Context:   Objective  Well appearing patient in no apparent distress; mood and affect are within normal limits. Scalp I went over in some detail her history of hairstyle and hair care in the past.  Historically she has used chemicals on her hair and worn both extensions and corn rows; the styles have not been used recently.  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  There has been no improvement on her medications.  We discussed possible referral to Dr. Lois Huxley at [facility] who is a specialist in this type of hair loss; she is amenable to a scheduling this (this could take 1 year).  We discussed intralesional triamcinolone and protein rich plasma along with the possibility of future biopsies.  I further discussed the proven value of Plaquenil for hair loss secondary to lupus.  Frontal fibrosing alopecia is not related to lupus but there are case reports of response to Plaquenil so we will obtain baseline labs and may proceed with a 57-month minimal trial with this medication.  All side effects detailed.    A focused examination was performed including head and neck. Relevant physical exam findings are noted in the Assessment and Plan.   Assessment & Plan    Frontal fibrosing alopecia Scalp  Referral to Dr. Oliver Hum. Plaquenil 60 bid rf x 1  Glucose 6 phosphate dehydrogenase - Scalp  CBC with Differential/Platelets  - Scalp  CMP - Scalp  hydroxychloroquine (PLAQUENIL) 200 MG tablet - Scalp Take 1 tablet (200 mg total) by mouth 2 (two) times daily.      I, Lavonna Monarch, MD, have reviewed all documentation for this visit.  The documentation on 12/11/21 for the exam, diagnosis, procedures, and orders are all accurate and complete.

## 2021-12-11 NOTE — Patient Instructions (Addendum)
[employer] oil / scalp conditioner.  Get labs done before you start medication.

## 2021-12-12 ENCOUNTER — Other Ambulatory Visit: Payer: Self-pay | Admitting: Acute Care

## 2021-12-12 DIAGNOSIS — Z87891 Personal history of nicotine dependence: Secondary | ICD-10-CM

## 2021-12-12 DIAGNOSIS — F17210 Nicotine dependence, cigarettes, uncomplicated: Secondary | ICD-10-CM

## 2021-12-14 LAB — COMPREHENSIVE METABOLIC PANEL
AG Ratio: 1.3 (calc) (ref 1.0–2.5)
ALT: 32 U/L — ABNORMAL HIGH (ref 6–29)
AST: 30 U/L (ref 10–35)
Albumin: 4.7 g/dL (ref 3.6–5.1)
Alkaline phosphatase (APISO): 68 U/L (ref 37–153)
BUN: 15 mg/dL (ref 7–25)
CO2: 29 mmol/L (ref 20–32)
Calcium: 9.7 mg/dL (ref 8.6–10.4)
Chloride: 100 mmol/L (ref 98–110)
Creat: 0.7 mg/dL (ref 0.50–1.05)
Globulin: 3.5 g/dL (calc) (ref 1.9–3.7)
Glucose, Bld: 75 mg/dL (ref 65–139)
Potassium: 4.1 mmol/L (ref 3.5–5.3)
Sodium: 139 mmol/L (ref 135–146)
Total Bilirubin: 0.6 mg/dL (ref 0.2–1.2)
Total Protein: 8.2 g/dL — ABNORMAL HIGH (ref 6.1–8.1)

## 2021-12-14 LAB — CBC WITH DIFFERENTIAL/PLATELET
Absolute Monocytes: 392 cells/uL (ref 200–950)
Basophils Absolute: 48 cells/uL (ref 0–200)
Basophils Relative: 0.6 %
Eosinophils Absolute: 104 cells/uL (ref 15–500)
Eosinophils Relative: 1.3 %
HCT: 49.2 % — ABNORMAL HIGH (ref 35.0–45.0)
Hemoglobin: 16.4 g/dL — ABNORMAL HIGH (ref 11.7–15.5)
Lymphs Abs: 2848 cells/uL (ref 850–3900)
MCH: 29.4 pg (ref 27.0–33.0)
MCHC: 33.3 g/dL (ref 32.0–36.0)
MCV: 88.2 fL (ref 80.0–100.0)
MPV: 10.2 fL (ref 7.5–12.5)
Monocytes Relative: 4.9 %
Neutro Abs: 4608 cells/uL (ref 1500–7800)
Neutrophils Relative %: 57.6 %
Platelets: 276 10*3/uL (ref 140–400)
RBC: 5.58 10*6/uL — ABNORMAL HIGH (ref 3.80–5.10)
RDW: 15.1 % — ABNORMAL HIGH (ref 11.0–15.0)
Total Lymphocyte: 35.6 %
WBC: 8 10*3/uL (ref 3.8–10.8)

## 2021-12-14 LAB — GLUCOSE 6 PHOSPHATE DEHYDROGENASE: G-6PDH: 19.2 U/g Hgb (ref 7.0–20.5)

## 2021-12-17 NOTE — Progress Notes (Signed)
[name] [name], female    DOB: December 28, 1960,    MRN: [id number]   Synopsis: First referred in 2017 for evaluation of COPD/ active smoker 2016 pulmonary function testing from [facility] showed FEV1 of 870 mL (37% predicted) consistent with severe airflow obstruction 2015 CT scan from [facility] showed severe panlobular emphysema She is prescribed 2 L O2 qHS       06/10/2018  Acute extended  ov/[name] [name] re: post ER eval  06/08/18 dx aecopd/bronchitis rx zpak  / pt with GOLD III criteria / still smoking Chief Complaint  Patient presents with   Acute Visit    hemoptysis since 06/08/18.    Dyspnea:  MMRC2 = can't walk a nl pace on a flat grade s sob but does fine slow and flat does [employer]k s 02 and that hasn't changed since onset of hemoptysis Cough: acute onset  on 06/08/18 mostly mucoid with streaky hemoptysis assoc with lots of nasal congestion but no purulent / bloody d/c  SABA use: has it and says helps but hfa at 0% effective baseline technique rec Prednisone 10 mg take  4 each am x 2 days,   2 each am x 2 days,  1 each am x 2 days and stop  Finish your antibiotics  Try change spiriva to the respimat 1.25  X 4 pffs each am - no change on serevent for now  Work on inhaler technique: e only use your albuterol (PROVENTIL)  as a rescue medication   05/25/2020  Acute extended ov/[name] [name] re: cough / still smoking / maint on serevent / spriva smi and s/p 2nd pfizer vaccine 04/10/20  Chief Complaint  Patient presents with   Acute Visit    COPD, more coughing 1 month  Dyspnea:  Worse with cough x 1 month Cough: slt green with lump in throat "all the time  Sleeping: occ wakes up  SABA use: avg maybe 2-3 per week  02: 2lpm rec Stop serevent and spiriva and start stiolto 2 puffs each am  Prednisone 10 mg take  4 each am x 2 days,   2 each am x 2 days,  1 each am x 2 days and stop  zpak  Pantoprazole (protonix) 40 mg   Take  30-60 min before first meal of the day and Pepcid (famotidine)  20 mg one after  supper  until return to office  The key is to stop smoking completely before smoking completely stops you!   NP recs 06/13/20 Continue Stiolto 2 puffs once daily in the morning Start taking mucinex twice daily for 2 weeks  Use 1-2L oxygen as needed on exertion to keep O2 level > 88-90% Strongly encourage you quit smoking- recommend you taper down amount and pick a quit date GERD: Stop Protonix Continue Pepcid at bedtime       09/26/2020  f/u ov/[name] [name] re:   Back on serevent and spiriva "stiolto stopped workingTour manager Complaint  Patient presents with   Follow-up    denies any issues with breathing   Dyspnea: [employer] does fine  Cough: min am mucoid  Sleeping: no respiratory complaints flat bed/ one pillow  SABA use: 2 x weekly 02: 2lpm at bedtime / no portable  Rec Plan A = Automatic = Always=    serevent twice daily and spiriva 1.25 2puff each a  Plan B = Backup (to supplement plan A, not to replace it) Only use your albuterol inhaler  as a rescue medication  Plan C =  Crisis (instead of Plan B but only if Plan B stops working) - only use your albuterol nebulizer if you first try Plan B and it fails to help > ok to use the nebulizer up to every 4 hours but if start needing it regularly call for immediate appointment Plan D = Doctor - call me if ABC not working The key is to stop smoking completely before smoking completely stops you!  06/07/21 NP [name] [name]  COPD: - Stop Spiriva - Start Stiolto - take 2 puffs every morning  Chronic respiratory failure: - You need to wear 2L oxygen while at home and at night (use 3L with portable concentrator)  Orders: - Renew oxygen with [employer], needs 3L and POC   09/10/2021  f/u ov/[name] [name] re: GOLD 3  COPD  still smoking  maint on stiolto   Chief Complaint  Patient presents with   Follow-up    COPD  Dyspnea:  fine at [employer], [employer] parking  Cough: none  Sleeping: flat bed, one pillow  SABA use: varies 1-2 puff proair, never  02: 3lpm  hs concentrator  Covid status:   vax 3/4 planned  Rec Plan A = Automatic = Always=    Stiolto 2 puffs still each am  Work on inhaler technique:    Plan B = Backup (to supplement plan A, not to replace it) Only use your albuterol inhaler (Proair) as a rescue medication  Plan C = Crisis (instead of Plan B but only if Plan B stops working) - only use your albuterol nebulizer if you first try Plan B and it fails to help Ok to try albuterol 15 min before an activity (on alternating days)  that you know would usually make you short of breath The key is to stop smoking completely before smoking completely stops you! - amb 02 study 09/10/2021 :  Required 4lpm cont to keep sats > 90% with 86% on room air      12/18/2021  f/u ov/[name] [name] re: GOLD 3 copd    maint on stiolto 2 each am  / still smoking  Chief Complaint  Patient presents with   Follow-up    Breathing is unchanged. She has good days and bad days, occ cough and wheezing. Her cough has been prod with white sputum. She uses her albuterol inhaler 2 x per wk on average. She uses her neb daily.   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   Sleeping  without nocturnal  or early am exacerbation  of respiratory  c/o's or need for noct saba. Also denies any obvious fluctuation of symptoms with weather or environmental changes or other aggravating or alleviating factors except as outlined above   No unusual exposure hx or h/o childhood pna/ asthma or knowledge of premature birth.  Current Allergies, Complete Past Medical History, Past Surgical History, Family History, and  Social History were reviewed in [employer] record.  ROS  The following are not active complaints unless bolded Hoarseness, sore throat, dysphagia, dental problems, itching, sneezing,  nasal congestion or discharge of excess mucus or purulent secretions, ear ache,   fever, chills, sweats, unintended wt loss or wt gain, classically pleuritic or exertional cp,  orthopnea pnd or arm/hand swelling  or leg swelling, presyncope, palpitations, abdominal pain, anorexia, nausea, vomiting, diarrhea  or change in bowel habits or change in bladder habits, change in stools or change in urine, dysuria, hematuria,  rash, arthralgias, visual complaints, headache, numbness, weakness or ataxia or problems with walking or coordination,  change in mood or  memory.        Current Meds  Medication Sig   acetaminophen (TYLENOL) 325 MG tablet Take 325-650 mg by mouth every 6 (six) hours as needed (pain).   albuterol (PROVENTIL) (2.5 MG/3ML) 0.083% nebulizer solution Take 3 mLs (2.5 mg total) by nebulization every 6 (six) hours as needed for wheezing or shortness of breath.   albuterol (VENTOLIN HFA) 108 (90 Base) MCG/ACT inhaler Inhale 2 puffs into the lungs every 6 (six) hours as needed for wheezing or shortness of breath.   amLODipine (NORVASC) 5 MG tablet Take 5 mg by mouth every morning.   buPROPion (WELLBUTRIN SR) 100 MG 12 hr tablet Take 100 mg by mouth 2 (two) times daily.     colchicine 0.6 MG tablet Take 1 tablet (0.6 mg total) by mouth daily. Take 2 tablets today as soon as you pick up the prescription.  And then 1 more tablet tonight before bed.  If your pain is persisting when you return tomorrow morning continue to take 1 tablet in the morning and 1 at night until your pain resolves. (Patient taking differently: Take 0.6 mg by mouth daily. Take 2 tablets today as soon as you pick up the prescription.  And then 1 more tablet tonight before bed.  If your pain is persisting when you return  tomorrow morning continue to take 1 tablet in the morning and 1 at night until your pain resolves.)   denosumab (PROLIA) 60 MG/ML SOSY injection Inject 60 mg into the skin every 6 (six) months.   Mirabegron (MYRBETRIQ PO) Take 1 tablet by mouth daily.   Multiple Vitamin (MULTIVITAMIN WITH MINERALS) TABS tablet Take 1 tablet by mouth every morning.   OXYGEN Inhale 5 L into the lungs continuous.   Tiotropium Bromide-Olodaterol (STIOLTO RESPIMAT) 2.5-2.5 MCG/ACT AERS Inhale 2 puffs into the lungs daily.                      Objective:     Wts  12/18/2021         133  09/10/2021      136  09/26/2020     132 05/25/2020       131  06/10/18 129 lb 9.6 oz (58.8 kg)  03/17/18 133 lb 12.8 oz (60.7 kg)  01/08/18 138 lb (62.6 kg)     Vital signs reviewed  12/18/2021  - Note at rest 02 sats  93% on 2lpm cont   General appearance:    elderly amb bf easily confused with details of care   HEENT : pt wearing mask not removed for exam due to covid -19 concerns.    NECK :  without JVD/Nodes/TM/ nl carotid upstrokes bilaterally   LUNGS: no acc muscle use,  Mod barrel  contour chest wall with bilateral  Distant bs s audible wheeze and  without cough on insp or exp maneuvers and mod  Hyperresonant  to  percussion bilaterally     CV:  RRR  no s3 or murmur or increase in P2, and no edema   ABD:  soft and nontender with pos mid insp Hoover's  in the supine position. No bruits or organomegaly appreciated, bowel sounds nl  MS:     ext warm without deformities, calf tenderness, cyanosis or clubbing No obvious joint restrictions   SKIN: warm and dry without lesions    NEURO:  alert, approp, nl sensorium with  no motor or cerebellar deficits apparent.            I personally reviewed images and agree with radiology impression as follows:   Chest LDSCT  12/06/21 1. Lung-RADS 2, benign appearance or behavior. Continue annual screening with low-dose chest CT without contrast in 12 months. 2. Slight  improvement in collapse/consolidation in the right middle lobe. 3.  Aortic atherosclerosis (ICD10-I70.0). 4. Enlarged pulmonic trunk, indicative of pulmonary arterial hypertension. 5.  Emphysema (ICD10-J43.9).    Assessment

## 2021-12-18 ENCOUNTER — Ambulatory Visit (INDEPENDENT_AMBULATORY_CARE_PROVIDER_SITE_OTHER): Payer: Medicare Other | Admitting: Internal Medicine

## 2021-12-18 ENCOUNTER — Encounter: Payer: Self-pay | Admitting: Internal Medicine

## 2021-12-18 ENCOUNTER — Other Ambulatory Visit: Payer: Self-pay

## 2021-12-18 VITALS — BP 102/60 | HR 89 | Temp 98.0°F | Ht 64.5 in | Wt 133.0 lb

## 2021-12-18 DIAGNOSIS — J9611 Chronic respiratory failure with hypoxia: Secondary | ICD-10-CM | POA: Diagnosis not present

## 2021-12-18 DIAGNOSIS — J449 Chronic obstructive pulmonary disease, unspecified: Secondary | ICD-10-CM | POA: Diagnosis not present

## 2021-12-18 DIAGNOSIS — F17210 Nicotine dependence, cigarettes, uncomplicated: Secondary | ICD-10-CM | POA: Diagnosis not present

## 2021-12-18 MED ORDER — STIOLTO RESPIMAT 2.5-2.5 MCG/ACT IN AERS: 2.0000 | INHALATION_SPRAY | Freq: Every day | RESPIRATORY_TRACT | 0 refills | Status: DC

## 2021-12-18 NOTE — Patient Instructions (Addendum)
We will be referring you for best fit for ambulatory 02   Make sure you check your oxygen saturation  AT  your highest level of activity (not after you stop)   to be sure it stays over 90% and adjust  02 flow upward to maintain this level if needed but remember to turn it back to previous settings when you stop (to conserve your supply).    Plan A = Automatic = Always=   Stiolto 2 puffs each am  Plan B = Backup (to supplement plan A, not to replace it) Only use your albuterol inhaler as a rescue medication to be used if you can't catch your breath by resting or doing a relaxed purse lip breathing pattern.  - The less you use it, the better it will work when you need it. - Ok to use the inhaler up to 2 puffs  every 4 hours if you must but call for appointment if use goes up over your usual need - Don't leave home without it !!  (think of it like the spare tire for your car)   Plan C = Crisis (instead of Plan B but only if Plan B stops working) - only use your albuterol nebulizer if you first try Plan B and it fails to help > ok to use the nebulizer up to every 4 hours but if start needing it regularly call for immediate appointment    The key is to stop smoking completely before smoking completely stops you!    Please schedule a follow up visit in 3 months but call sooner if needed  with all  inhalers/ solutions in hand so we can verify exactly what you are taking. This includes all medications from all doctors and over the counters

## 2021-12-19 ENCOUNTER — Encounter: Payer: Self-pay | Admitting: Internal Medicine

## 2021-12-19 ENCOUNTER — Telehealth: Payer: Self-pay

## 2021-12-19 DIAGNOSIS — L661 Lichen planopilaris: Secondary | ICD-10-CM

## 2021-12-19 DIAGNOSIS — Z5181 Encounter for therapeutic drug level monitoring: Secondary | ICD-10-CM

## 2021-12-19 NOTE — Telephone Encounter (Signed)
-----   Message from Lavonna Monarch, MD sent at 12/15/2021  5:05 PM EST ----- May begin Plaquenil therapy, 2 tablets daily.  Recheck patient on lab in 2 months

## 2021-12-19 NOTE — Assessment & Plan Note (Signed)
Counseled re importance of smoking cessation but did not meet time criteria for separate billing           Each maintenance medication was reviewed in detail including emphasizing most importantly the difference between maintenance and prns and under what circumstances the prns are to be triggered using an action plan format where appropriate.  Total time for H and P, chart review, counseling, reviewing hfa/smi/02 device(s) and generating customized AVS unique to this office visit / same day charting =  32 min

## 2021-12-19 NOTE — Assessment & Plan Note (Signed)
Active smoker  - PFT's  04/08/2006  FEV1 1.32 (51 % ) ratio 0.53  p 8 % improvement from saba p ? prior to study with   A  FV curve classic moderate concavity   06/10/2018  After extensive coaching inhaler device  effectiveness =   50% from a baseline of 0   - 05/25/2020  After extensive coaching inhaler device,  effectiveness =    75% (poor insp flow, short Ti) > try stiolto 2 puffs each am  > preferred spiriva 1.25/ serevent  - PFT's  09/26/2020  FEV1 0.79 (38 % ) ratio 0.49  p 0 % improvement from saba p servent/spiriva 1.25 (very last dose in device) prior to study with DLCO  7.67 (38%) corrects to 2.84 (66%)  for alv volume and FV curve classically concave exp loop   - CT chest 12/06/21  Emphysema  - 12/18/2021  After extensive coaching inhaler device,  effectiveness =    75% with smi   Pt is Group B in terms of symptom/risk and laba/lama therefore appropriate rx at this point >>>  Continue stiolto and approp saba  Re SABA :  I spent extra time with pt today reviewing appropriate use of albuterol for prn use on exertion with the following points: 1) saba is for relief of sob that does not improve by walking a slower pace or resting but rather if the pt does not improve after trying this first. 2) If the pt is convinced, as many are, that saba helps recover from activity faster then it's easy to tell if this is the case by re-challenging : ie stop, take the inhaler, then p 5 minutes try the exact same activity (intensity of workload) that just caused the symptoms and see if they are substantially diminished or not after saba 3) if there is an activity that reproducibly causes the symptoms, try the saba 15 min before the activity on alternate days   If in fact the saba really does help, then fine to continue to use it prn but advised may need to look closer at the maintenance regimen being used to achieve better control of airways disease with exertion.

## 2021-12-19 NOTE — Assessment & Plan Note (Signed)
On 2lpm hs s portable 02 as of 09/26/2020  - as of 09/10/2021 3lpm hs - amb 02 study 09/10/2021 :  Required 4lpm cont to keep sats > 90% with 86% on room air  - 12/18/2021 referred for best fit for amb 02 considering 4lpm cont is needed  In meantime advised  Make sure you check your oxygen saturation  AT  your highest level of activity (not after you stop)   to be sure it stays over 90% and adjust  02 flow upward to maintain this level if needed but remember to turn it back to previous settings when you stop (to conserve your supply).

## 2021-12-19 NOTE — Telephone Encounter (Signed)
-----   Message from Lavonna Monarch, MD sent at 12/15/2021  5:05 PM EST ----- May begin Plaquenil therapy, 2 tablets daily.  Recheck patient on lab in 2 months    Patient advised to have labs rechecked in 2 months the order has been submitted to the lab.

## 2021-12-23 ENCOUNTER — Encounter: Payer: Self-pay | Admitting: Internal Medicine

## 2022-01-01 ENCOUNTER — Telehealth: Payer: Self-pay | Admitting: Dermatology

## 2022-01-01 NOTE — Telephone Encounter (Signed)
Patient states that she thinks medication is too strong so they cut the dose in half but she is still having hair loss. I made appointment for her on 1/26 but she wants to know if she should continue the medication.

## 2022-01-02 NOTE — Telephone Encounter (Signed)
Phone call to patient with Dr. Onalee Hua recommendations. Patient aware and states she'll do what Dr. Denna Haggard has recommended.

## 2022-01-02 NOTE — Telephone Encounter (Signed)
Left message for patient to return our phone call.

## 2022-01-02 NOTE — Telephone Encounter (Signed)
Patient returned phone call and said that more hair was coming out of the left side of her scalp than the right and that was why she thought the Plaquenil was too strong.  Per patient she does not want her progress to be slower so for right now she will go back to taking 2 pills of Plaquenil daily.

## 2022-01-09 ENCOUNTER — Other Ambulatory Visit: Payer: Self-pay | Admitting: Emergency Medicine

## 2022-01-10 ENCOUNTER — Ambulatory Visit: Payer: Medicare Other | Admitting: Dermatology

## 2022-01-22 ENCOUNTER — Other Ambulatory Visit: Payer: Self-pay | Admitting: Internal Medicine

## 2022-01-22 DIAGNOSIS — Z1231 Encounter for screening mammogram for malignant neoplasm of breast: Secondary | ICD-10-CM

## 2022-02-13 ENCOUNTER — Other Ambulatory Visit: Payer: Self-pay | Admitting: Emergency Medicine

## 2022-02-25 ENCOUNTER — Other Ambulatory Visit: Payer: Self-pay

## 2022-02-25 ENCOUNTER — Ambulatory Visit
Admission: RE | Admit: 2022-02-25 | Discharge: 2022-02-25 | Disposition: A | Discharge status: Home / Self Care | Payer: Medicare Other | Source: Ambulatory Visit | Attending: Internal Medicine | Admitting: Internal Medicine

## 2022-02-25 DIAGNOSIS — Z1231 Encounter for screening mammogram for malignant neoplasm of breast: Secondary | ICD-10-CM

## 2022-03-05 ENCOUNTER — Ambulatory Visit (INDEPENDENT_AMBULATORY_CARE_PROVIDER_SITE_OTHER): Payer: Medicare Other | Admitting: Dermatology

## 2022-03-05 ENCOUNTER — Encounter: Payer: Self-pay | Admitting: Dermatology

## 2022-03-05 ENCOUNTER — Other Ambulatory Visit: Payer: Self-pay

## 2022-03-05 DIAGNOSIS — L661 Lichen planopilaris: Secondary | ICD-10-CM

## 2022-03-05 NOTE — Patient Instructions (Signed)
CALL OFFICE IN A WEEK TO FOLLOW UP ON HAIR LOSS. CONTINUE THE PLAQUENIL ?

## 2022-03-18 ENCOUNTER — Encounter: Payer: Self-pay | Admitting: Dermatology

## 2022-03-18 NOTE — Progress Notes (Signed)
? ?  Follow-Up Visit ?  ?Subjective  ?[name] [name] is a 61 y.o. female who presents for the following: Hair/Scalp Problem (Pt states her hair is thinning out x 3 months. Pt still using kera oil. ). ? ?Hair loss which patient feels is still progressive despite Plaquenil therapy. ?Location:  ?Duration:  ?Quality:  ?Associated Signs/Symptoms: ?Modifying Factors:  ?Severity:  ?Timing: ?Context:  ? ?Objective  ?Well appearing patient in no apparent distress; mood and affect are within normal limits. ?Scalp ?Patient feels that there is continued loss of hair density particularly in the front; I feel there is stabilization of hair loss, I do respect her opinion and believe it is actually more important than by.  Of note is the fact that she is a daily smoker; and other forms of such as lupus, smoking may essentially make Plaquenil therapy completely ineffective.  Although I do not recall reading of this with frontal fibrosing alopecia, I nonetheless would strongly encouraged her to reattempt smoking cessation if you approve any other medical problems. ? ? ? ?A focused examination was performed including head and neck.. Relevant physical exam findings are noted in the Assessment and Plan. ? ? ?Assessment & Plan  ? ? ?Frontal fibrosing alopecia ?Scalp ? ?She will plan on continuing the Plaquenil for 3 months; if she feels there has been continued loss of density, at that point I would discontinue therapy.  Encouraged to pursue smoking cessation.  We will have her scheduled for Dr. Warren Lacy McMichael's opinion in the future. ? ?Related Medications ?hydroxychloroquine (PLAQUENIL) 200 MG tablet ?Take 1 tablet (200 mg total) by mouth 2 (two) times daily. ? ? ? ? ? ?I, Lavonna Monarch, MD, have reviewed all documentation for this visit.  The documentation on 03/18/22 for the exam, diagnosis, procedures, and orders are all accurate and complete. ?

## 2022-03-28 ENCOUNTER — Encounter: Payer: Self-pay | Admitting: Internal Medicine

## 2022-03-28 ENCOUNTER — Ambulatory Visit (INDEPENDENT_AMBULATORY_CARE_PROVIDER_SITE_OTHER): Payer: Medicare Other | Admitting: Internal Medicine

## 2022-03-28 DIAGNOSIS — F17210 Nicotine dependence, cigarettes, uncomplicated: Secondary | ICD-10-CM

## 2022-03-28 DIAGNOSIS — J9611 Chronic respiratory failure with hypoxia: Secondary | ICD-10-CM | POA: Diagnosis not present

## 2022-03-28 DIAGNOSIS — J449 Chronic obstructive pulmonary disease, unspecified: Secondary | ICD-10-CM | POA: Diagnosis not present

## 2022-03-28 MED ORDER — STIOLTO RESPIMAT 2.5-2.5 MCG/ACT IN AERS: 2.0000 | INHALATION_SPRAY | Freq: Every day | RESPIRATORY_TRACT | 0 refills | Status: DC

## 2022-03-28 NOTE — Progress Notes (Signed)
? ?[name] [name], female    DOB: 08-18-1961     MRN: [id number]  ? ?Synopsis: 42 yobf active smoker  1st referred in 2017 for evaluation of COPD/  ?2016 pulmonary function testing from [facility] showed FEV1 of 870 mL (37% predicted) consistent with severe airflow obstruction ?2015 CT scan from [facility] showed severe panlobular emphysema ?She is prescribed 2 L O2 qHS   ?  ? ? ?06/10/2018  Acute extended  ov/[name] [name] re: post ER eval  06/08/18 dx aecopd/bronchitis rx zpak  / pt with GOLD III criteria / still smoking ?Chief Complaint  ?Patient presents with  ? Acute Visit  ?  hemoptysis since 06/08/18.    ?Dyspnea:  MMRC2 = can't walk a nl pace on a flat grade s sob but does fine slow and flat does [employer] 0k s 02 and that hasn't changed since onset of hemoptysis ?Cough: acute onset  on 06/08/18 mostly mucoid with streaky hemoptysis assoc with lots of nasal congestion but no purulent / bloody d/c  ?SABA use: has it and says helps but hfa at 0% effective baseline technique ?rec ?Prednisone 10 mg take  4 each am x 2 days,   2 each am x 2 days,  1 each am x 2 days and stop  ?Finish your antibiotics  ?Try change spiriva to the respimat 1.25  X 4 pffs each am - no change on serevent for now  ?Work on inhaler technique: e ?only use your albuterol (PROVENTIL)  as a rescue medication ? ?  ?09/26/2020  f/u ov/[name] [name] re:   Back on serevent and spiriva "stiolto stopped working"  ?Chief Complaint  ?Patient presents with  ? Follow-up  ?  denies any issues with breathing  ? Dyspnea: [employer] does fine  ?Cough: min am mucoid  ?Sleeping: no respiratory complaints flat bed/ one pillow  ?SABA use: 2 x weekly ?02: 2lpm at bedtime / no portable  ?Rec ?Plan A = Automatic = Always=    serevent twice daily and spiriva 1.25 2puff each a  ?Plan B = Backup (to supplement plan A, not to replace it) ?Only use your albuterol inhaler  as a rescue medication  ?Plan C = Crisis (instead of Plan B but only if Plan B stops working) ?- only use your albuterol nebulizer  if you first try Plan B and it fails to help > ok to use the nebulizer up to every 4 hours but if start needing it regularly call for immediate appointment ?Plan D = Doctor ?- call me if ABC not working ?The key is to stop smoking completely before smoking completely stops you! ? ? ? ?12/18/2021  f/u ov/[name] [name] re: GOLD 3 copd    maint on stiolto 2 each am  / still smoking  ?Chief Complaint  ?Patient presents with  ? Follow-up  ?  Breathing is unchanged. She has good days and bad days, occ cough and wheezing. Her cough has been prod with white sputum. She uses her albuterol inhaler 2 x per wk on average. She uses her neb daily.   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 Plan A = Automatic = Always=   Stiolto 2 puffs each am ?Plan B = Backup (to supplement plan A, not to replace it) ?Only use your albuterol inhaler as a rescue medication  ?Plan C = Crisis (instead of Plan B but only if Plan B stops working) ?- only use your albuterol nebulizer if you first try Plan B and it fails to help > ok to use the nebulizer up to every 4 hours but if start needing it regularly call for immediate appointment ?The key is to stop smoking completely before smoking completely stops you! ?Please schedule a follow up visit in 3 months but call sooner if needed  with all  inhalers/ solutions in hand  ? ? ?03/28/2022  f/u ov/[name] [name] re: GOLD 3 copd / RML atx   maint on  stiolto  ?Chief Complaint  ?Patient presents with  ? Follow-up  ?  Follow up. Patient has no complaints.   ?Dyspnea:  [employer] walking, uses [employer] parking  ?Cough: minimal  ?Sleeping: bed  is flat/ two pillows  ?SABA use: maybe  twice daily usually p exerts ?02: 3lpm at hs / no portable  ?Covid status:  all vax  ? ? ?No obvious day to day or daytime variability or assoc excess/ purulent sputum or mucus plugs or hemoptysis or cp or chest tightness, subjective wheeze or overt sinus or hb symptoms.  ? ?Sleeping  without nocturnal  or early am exacerbation  of respiratory  c/o's or need for noct saba. Also denies any obvious fluctuation of symptoms with weather or environmental changes or other aggravating or alleviating factors except as outlined above  ? ?No unusual exposure hx or h/o childhood pna/ asthma or knowledge of premature birth. ? ?Current Allergies, Complete Past Medical History, Past Surgical History, Family History, and Social History were reviewed in [employer] record. ? ?ROS  The following are not active complaints unless bolded ?Hoarseness, sore throat, dysphagia, dental problems, itching, sneezing,  nasal congestion or discharge of excess mucus or purulent secretions, ear ache,   fever, chills, sweats, unintended wt loss or wt gain, classically pleuritic or exertional cp,  orthopnea pnd or arm/hand swelling  or leg swelling, presyncope, palpitations, abdominal pain, anorexia, nausea, vomiting, diarrhea  or change in bowel habits or change in bladder habits, change in stools or change in urine, dysuria, hematuria,  rash, arthralgias, visual complaints, headache, numbness, weakness or ataxia or problems with walking or coordination,  change in mood or  memory. ?      ? ?Current Meds  ?Medication Sig  ? acetaminophen (TYLENOL) 325 MG tablet Take 325-650 mg by mouth every 6 (six) hours as needed (pain).  ? albuterol (PROVENTIL) (2.5 MG/3ML) 0.083% nebulizer solution Take 3 mLs (2.5 mg total) by nebulization every 6 (six) hours as needed for wheezing or shortness of breath.  ? albuterol (VENTOLIN HFA) 108 (90 Base) MCG/ACT inhaler Inhale 2 puffs into the lungs every 6  (six) hours as needed for wheezing or shortness of breath.  ? amLODipine (NORVASC) 5 MG tablet Take 5 mg by mouth every morning.  ? buPROPion (WELLBUTRIN SR) 100 MG 12 hr tablet Take 100 mg by mouth 2 (two) times daily.    ? colchicine 0.6 MG tablet Take 1 tablet (0.6 mg total) by mouth daily. Take 2 tablets today as soon as you pick up  the prescription.  And then 1 more tablet tonight before bed.  If your pain is persisting when you return tomorrow morning continue to take 1 tablet in the morning and 1 at night until your pain resolves. (Patient taking differently: Take 0.6 mg by mouth daily. Take 2 tablets today as soon as you pick up the prescription.  And then 1 more tablet tonight before bed.  If your pain is persisting when you return tomorrow morning continue to take 1 tablet in the morning and 1 at night until your pain resolves.)  ? denosumab (PROLIA) 60 MG/ML SOSY injection Inject 60 mg into the skin every 6 (six) months.  ? hydroxychloroquine (PLAQUENIL) 200 MG tablet Take 1 tablet (200 mg total) by mouth 2 (two) times daily.  ? Mirabegron (MYRBETRIQ PO) Take 1 tablet by mouth daily.  ? Multiple Vitamin (MULTIVITAMIN WITH MINERALS) TABS tablet Take 1 tablet by mouth every morning.  ? OXYGEN Inhale 5 L into the lungs continuous.  ? Tiotropium Bromide-Olodaterol (STIOLTO RESPIMAT) 2.5-2.5 MCG/ACT AERS Inhale 2 puffs into the lungs daily.  ? Tiotropium Bromide-Olodaterol (STIOLTO RESPIMAT) 2.5-2.5 MCG/ACT AERS Inhale 2 puffs into the lungs daily.  ?    ? ?   ?       ? ?  ? ?   ?Objective:  ?   ?Wts ? ?03/28/2022       134  ?12/18/2021         133 ? 09/10/2021      136  ?09/26/2020     132 ?05/25/2020       131  ?06/10/18 129 lb 9.6 oz (58.8 kg)  ?03/17/18 133 lb 12.8 oz (60.7 kg)  ?01/08/18 138 lb (62.6 kg)  ?  ? Vital signs reviewed  03/28/2022  - Note at rest 02 sats  94% on RA  ? ?General appearance:    amb bf nad  ? ?HEENT : nl exam  ? ? ?NECK :  without JVD/Nodes/TM/ nl carotid upstrokes  bilaterally ? ? ?LUNGS: no acc muscle use,  Mod barrel  contour chest wall with bilateral  Distant bs s audible wheeze and  without cough on insp or exp maneuvers and mod  Hyperresonant  to  percussion bilaterally   ? ?

## 2022-03-28 NOTE — Patient Instructions (Addendum)
Plan A = Automatic = Always=    Stiolto  2 puffs each am  ? ?Work on inhaler technique:  relax and gently blow all the way out then take a nice smooth full deep breath back in, triggering the inhaler at same time you start breathing in.  Hold for up to 5 seconds if you can. Blow out thru nose. Rinse and gargle with water when done.  If mouth or throat bother you at all,  try brushing teeth/gums/tongue with arm and hammer toothpaste/ make a slurry and gargle and spit out.  ? ? - remember how the golfers take practice swings ? ?Plan B = Backup (to supplement plan A, not to replace it) ?Only use your albuterol inhaler as a rescue medication to be used if you can't catch your breath by resting or doing a relaxed purse lip breathing pattern.  ?- The less you use it, the better it will work when you need it. ?- Ok to use the inhaler up to 2 puffs  every 4 hours if you must but call for appointment if use goes up over your usual need ?- Don't leave home without it !!  (think of it like the spare tire for your car)  ? ?Plan C = Crisis (instead of Plan B but only if Plan B stops working) ?- only use your albuterol nebulizer if you first try Plan B and it fails to help > ok to use the nebulizer up to every 4 hours but if start needing it regularly call for immediate appointment ? ?Ok to try albuterol 15 min before an activity (on alternating days)  that you know would usually make you short of breath and see if it makes any difference and if makes none then don't take albuterol after activity unless you can't catch your breath as this means it's the resting that helps, not the albuterol. ?    ? ? ?Please schedule a follow up visit in 3 months but call sooner if needed  ?Add: try again for best fit for amb 02  ? ? ?  ?

## 2022-03-31 ENCOUNTER — Encounter: Payer: Self-pay | Admitting: Internal Medicine

## 2022-03-31 NOTE — Assessment & Plan Note (Signed)
Active smoker  ?- PFT's  04/08/2006  FEV1 1.32 (51 % ) ratio 0.53  p 8 % improvement from saba p ? prior to study with   A  FV curve classic moderate concavity   ?06/10/2018  After extensive coaching inhaler device  effectiveness =   50% from a baseline of 0   ?- 05/25/2020  After extensive coaching inhaler device,  effectiveness =    75% (poor insp flow, short Ti) > try stiolto 2 puffs each am  > preferred spiriva 1.25/ serevent  ?- PFT's  09/26/2020  FEV1 0.79 (38 % ) ratio 0.49  p 0 % improvement from saba p servent/spiriva 1.25 (very last dose in device) prior to study with DLCO  7.67 (38%) corrects to 2.84 (66%)  for alv volume and FV curve classically concave exp loop  ?- CT chest 12/06/21  Emphysema  ?- 03/28/2022  After extensive coaching inhaler device,  effectiveness =    80% with smi  ? ?Pt is Group B in terms of symptom/risk and laba/lama therefore appropriate rx at this point >>>  Continue stiolto and approp saba ? ?Re SABA :  I spent extra time with pt today reviewing appropriate use of albuterol for prn use on exertion with the following points: ?1) saba is for relief of sob that does not improve by walking a slower pace or resting but rather if the pt does not improve after trying this first. ?2) If the pt is convinced, as many are, that saba helps recover from activity faster then it's easy to tell if this is the case by re-challenging : ie stop, take the inhaler, then p 5 minutes try the exact same activity (intensity of workload) that just caused the symptoms and see if they are substantially diminished or not after saba ?3) if there is an activity that reproducibly causes the symptoms, try the saba 15 min before the activity on alternate days  ? ?If in fact the saba really does help, then fine to continue to use it prn but advised may need to look closer at the maintenance regimen being used to achieve better control of airways disease with exertion.   ? ?  ?

## 2022-03-31 NOTE — Assessment & Plan Note (Signed)
4-5 min discussion re active cigarette smoking in addition to office E&M ? ?Ask about tobacco use:  ongoing ?Advise quitting   Advised of danger of cigs/02 acutely as well as lung effects chronically  ?Assess willingness:  Not committed at this point ?Assist in quit attempt:  Per PCP when ready ?   ?  ?  ?

## 2022-03-31 NOTE — Assessment & Plan Note (Addendum)
On 2lpm hs s portable 02 as of 09/26/2020  ?- as of 09/10/2021 3lpm hs ?- amb 02 study 09/10/2021 :  Required 4lpm cont to keep sats > 90% with 86% on room air  ?- 12/18/2021 referred for best fit for amb 02 considering 4lpm cont is needed > not done as of 03/28/2022 > refer again (note PA increase on CT c/w incipient cor pulmonale)  ? ?Advised: ?Make sure you check your oxygen saturation  AT  your highest level of activity (not after you stop)   to be sure it stays over 90% and adjust  02 flow upward to maintain this level if needed but remember to turn it back to previous settings when you stop (to conserve your supply).  ? ?    ?  ? ?Each maintenance medication was reviewed in detail including emphasizing most importantly the difference between maintenance and prns and under what circumstances the prns are to be triggered using an action plan format where appropriate. ? ?Total time for H and P, chart review, counseling, reviewing smi device(s) and generating customized AVS unique to this office visit / same day charting = 24 min  ?     ?

## 2022-04-02 ENCOUNTER — Ambulatory Visit: Payer: Medicare Other | Admitting: Internal Medicine

## 2022-04-10 ENCOUNTER — Telehealth: Payer: Self-pay | Admitting: Internal Medicine

## 2022-04-10 NOTE — Telephone Encounter (Signed)
ATC x1.  LVM to return call.  Last OV note states Stiolto, not Spiriva.  Need clarification. ?

## 2022-04-11 NOTE — Telephone Encounter (Signed)
Patient was told to try Stiolto at last visit.  ? ?Dr. Melvyn Novas, are you ok with her having Spiriva?  ?

## 2022-04-11 NOTE — Telephone Encounter (Signed)
Stiolto is spriva plus another medication but if prefers spiriva by itself then that's fine, just can't take both  ?

## 2022-04-12 MED ORDER — STIOLTO RESPIMAT 2.5-2.5 MCG/ACT IN AERS: 2.0000 | INHALATION_SPRAY | Freq: Every day | RESPIRATORY_TRACT | 3 refills | Status: DC

## 2022-04-12 NOTE — Telephone Encounter (Signed)
Called and spoke with patient. She stated that she has decided to stay with Stiolto instead of the Spiriva 2.55mg. She wishes to have a RX sent in for her. I advised her that I would go ahead and send this in for her.  ? ?Nothing further needed at time of call.  ?

## 2022-04-28 ENCOUNTER — Emergency Department (HOSPITAL_COMMUNITY): Payer: Medicare Other

## 2022-04-28 ENCOUNTER — Encounter (HOSPITAL_COMMUNITY): Payer: Self-pay | Admitting: Emergency Medicine

## 2022-04-28 ENCOUNTER — Emergency Department (HOSPITAL_COMMUNITY)
Admission: EM | Admit: 2022-04-28 | Discharge: 2022-04-28 | Disposition: A | Discharge status: Home / Self Care | Payer: Medicare Other | Attending: Emergency Medicine | Admitting: Emergency Medicine

## 2022-04-28 ENCOUNTER — Other Ambulatory Visit: Payer: Self-pay

## 2022-04-28 DIAGNOSIS — J449 Chronic obstructive pulmonary disease, unspecified: Secondary | ICD-10-CM | POA: Insufficient documentation

## 2022-04-28 DIAGNOSIS — S6992XA Unspecified injury of left wrist, hand and finger(s), initial encounter: Secondary | ICD-10-CM | POA: Diagnosis present

## 2022-04-28 DIAGNOSIS — W231XXA Caught, crushed, jammed, or pinched between stationary objects, initial encounter: Secondary | ICD-10-CM | POA: Insufficient documentation

## 2022-04-28 NOTE — ED Triage Notes (Signed)
Pt c/o left hand pain and swelling after a fall yesterday. Denies hitting her hand or other injury. Hx COPD. ?

## 2022-04-28 NOTE — Discharge Instructions (Signed)
As we discussed, your work-up in the ER today was reassuring for acute abnormalities.  Your x-ray was negative for fracture or dislocation.  However, given the reduction of range of motion in your finger, I have placed you in a splint to ensure proper healing.  If you continue to be unable to fully move your finger, you will need to call orthopedics to schedule an appointment for further evaluation and management of this.  I have given you a referral with a number to call to schedule an appointment in the next few days.  In the interim, please rest, ice, compress, and elevate your finger and wear the brace for as long as you are unable to fully move your finger.  You may also take Tylenol as needed for pain. ? ?Return if development of any new or worsening symptoms. ?

## 2022-04-28 NOTE — ED Provider Notes (Signed)
?[facility] ?Provider Note ? ? ?CSN: [id number] ?Arrival date & time: 04/28/22  6834 ? ?  ? ?History ? ?Chief Complaint  ?Patient presents with  ? Hand Pain  ? ? ?[name] [name] is a 61 y.o. female. ? ?Patient with history of COPD and arthritis presents today with complaints of right hand pain. She states that yesterday she tripped and caught herself with her left hand and when she did so her ring finger subsequently rolled underneath her. She endorses pain specifically in this finger. Denies actually falling, hitting her head or any loss of consciousness. She is not anticoagulated. No other injuries or complaints ? ?The history is provided by the patient. No language interpreter was used.  ?Hand Pain ? ? ?  ? ?Home Medications ?Prior to Admission medications   ?Medication Sig Start Date End Date Taking? Authorizing Provider  ?acetaminophen (TYLENOL) 325 MG tablet Take 325-650 mg by mouth every 6 (six) hours as needed (pain).    [provider]  ?albuterol (PROVENTIL) (2.5 MG/3ML) 0.083% nebulizer solution Take 3 mLs (2.5 mg total) by nebulization every 6 (six) hours as needed for wheezing or shortness of breath. 02/06/16   Juanito Doom, MD  ?albuterol (VENTOLIN HFA) 108 (90 Base) MCG/ACT inhaler Inhale 2 puffs into the lungs every 6 (six) hours as needed for wheezing or shortness of breath. 06/13/20   Martyn Ehrich, NP  ?amLODipine (NORVASC) 5 MG tablet Take 5 mg by mouth every morning. 05/11/20   [provider]  ?buPROPion (WELLBUTRIN SR) 100 MG 12 hr tablet Take 100 mg by mouth 2 (two) times daily.      [provider]  ?colchicine 0.6 MG tablet Take 1 tablet (0.6 mg total) by mouth daily. Take 2 tablets today as soon as you pick up the prescription.  And then 1 more tablet tonight before bed.  If your pain is persisting when you return tomorrow morning continue to take 1 tablet in the morning and 1 at night until your pain  resolves. ?Patient taking differently: Take 0.6 mg by mouth daily. Take 2 tablets today as soon as you pick up the prescription.  And then 1 more tablet tonight before bed.  If your pain is persisting when you return tomorrow morning continue to take 1 tablet in the morning and 1 at night until your pain resolves. 09/10/20   Sponseller, Gypsy Balsam, PA-C  ?denosumab (PROLIA) 60 MG/ML SOSY injection Inject 60 mg into the skin every 6 (six) months.    [provider]  ?hydroxychloroquine (PLAQUENIL) 200 MG tablet Take 1 tablet (200 mg total) by mouth 2 (two) times daily. 12/11/21   Lavonna Monarch, MD  ?Mirabegron (MYRBETRIQ PO) Take 1 tablet by mouth daily.    [provider]  ?Multiple Vitamin (MULTIVITAMIN WITH MINERALS) TABS tablet Take 1 tablet by mouth every morning.    [provider]  ?OXYGEN Inhale 5 L into the lungs continuous.    [provider]  ?Tiotropium Bromide-Olodaterol (STIOLTO RESPIMAT) 2.5-2.5 MCG/ACT AERS Inhale 2 puffs into the lungs daily. 04/12/22   Tanda Rockers, MD  ?   ? ?Allergies    ?Clobetasol emul foam w-moistcr   ? ?Review of Systems   ?Review of Systems  ?Musculoskeletal:  Positive for arthralgias, joint swelling and myalgias.  ?All other systems reviewed and are negative. ? ?Physical Exam ?Updated Vital Signs ?BP 116/75   Pulse 99   Temp 98.6 ?F (37 ?C) (Oral)  Resp 18   SpO2 (!) 89%  ?Physical Exam ?Vitals and nursing note reviewed.  ?Constitutional:   ?   General: She is not in acute distress. ?   Appearance: Normal appearance. She is normal weight. She is not ill-appearing, toxic-appearing or diaphoretic.  ?HENT:  ?   Head: Normocephalic and atraumatic.  ?Cardiovascular:  ?   Rate and Rhythm: Normal rate.  ?Pulmonary:  ?   Effort: Pulmonary effort is normal. No respiratory distress.  ?Musculoskeletal:     ?   General: Normal range of motion.  ?   Cervical back: Normal range of motion.  ?   Comments: Swelling and tenderness noted to the dorsal  and palmar aspect of the MCP and PIP joint of the left ring finger. No overlying erythema or warmth. Flexion and extension of the PIP and DIP joint of this finger is limited. Upon initial assessment, PIP joint is bent at 90 degrees, some extension intact but very limited flexion of PIP or DIP. Full ROM intact to MCP joint. Capillary refill less than 2 seconds, distal sensation intact.  ?Skin: ?   General: Skin is warm and dry.  ?Neurological:  ?   General: No focal deficit present.  ?   Mental Status: She is alert.  ?Psychiatric:     ?   Mood and Affect: Mood normal.     ?   Behavior: Behavior normal.  ? ? ?ED Results / Procedures / Treatments   ?Labs ?(all labs ordered are listed, but only abnormal results are displayed) ?Labs Reviewed - No data to display ? ?EKG ?None ? ?Radiology ?DG Hand Complete Left ? ?Result Date: 04/28/2022 ?CLINICAL DATA:  Hand swelling.  Status post fall. EXAM: LEFT HAND - COMPLETE 3+ VIEW COMPARISON:  None Available. FINDINGS: There is no evidence of fracture or dislocation. There is no evidence of arthropathy or other focal bone abnormality. Soft tissues are unremarkable. IMPRESSION: Negative. Electronically Signed   By: Kerby Moors M.D.   On: 04/28/2022 07:26   ? ?Procedures ?Procedures  ? ? ?Medications Ordered in ED ?Medications - No data to display ? ?ED Course/ Medical Decision Making/ A&P ?  ?                        ?Medical Decision Making ?Amount and/or Complexity of Data Reviewed ?Radiology: ordered. ? ? ?Patient presents today for left ring finger injury yesterday.  Patient X-Ray negative for obvious fracture or dislocation.  I have personally reviewed this imaging and agree with radiology interpretation.  Patient does have reduced ROM to the ring finger specifically with flexion, therefore I have placed the finger in a splint in extension in the ER today.  Some concern for a tendon injury giving reduced ROM, will also give referral to hand for further evaluation of this.  No  concern for septic arthritis or flexor tenosynovitis at this time.  No further emergent concerns, will recommend RICE and Tylenol for pain control.  Patient is understanding and amenable with plan, educated on red flag symptoms of prompt immediate return.  Discharged in stable condition.  ? ?Final Clinical Impression(s) / ED Diagnoses ?Final diagnoses:  ?Finger injury, left, initial encounter  ? ? ?Rx / DC Orders ?ED Discharge Orders   ? ? None  ? ?  ?An After Visit Summary was printed and given to the patient. ? ? ?  ?Bud Face, PA-C ?04/28/22 [id number] ? ?  ?Deno Etienne, DO ?05/01/22 [phone] ? ?

## 2022-07-08 ENCOUNTER — Ambulatory Visit (INDEPENDENT_AMBULATORY_CARE_PROVIDER_SITE_OTHER): Payer: Medicare Other | Admitting: Internal Medicine

## 2022-07-08 ENCOUNTER — Encounter: Payer: Self-pay | Admitting: Internal Medicine

## 2022-07-08 DIAGNOSIS — F17210 Nicotine dependence, cigarettes, uncomplicated: Secondary | ICD-10-CM

## 2022-07-08 DIAGNOSIS — J449 Chronic obstructive pulmonary disease, unspecified: Secondary | ICD-10-CM | POA: Diagnosis not present

## 2022-07-08 DIAGNOSIS — J9611 Chronic respiratory failure with hypoxia: Secondary | ICD-10-CM

## 2022-07-08 MED ORDER — ALBUTEROL SULFATE HFA 108 (90 BASE) MCG/ACT IN AERS: 2.0000 | INHALATION_SPRAY | Freq: Four times a day (QID) | RESPIRATORY_TRACT | 3 refills | Status: DC | PRN

## 2022-07-08 NOTE — Patient Instructions (Signed)
Plan A = Automatic = Always=    Stiolto 2 puffs each am   Work on inhaler technique:  relax and gently blow all the way out then take a nice smooth full deep breath back in, triggering the inhaler at same time you start breathing in.  Hold for up to 5 seconds if you can.   Rinse and gargle with water when done.  If mouth or throat bother you at all,  try brushing teeth/gums/tongue with arm and hammer toothpaste/ make a slurry and gargle and spit out.     Plan B = Backup (to supplement plan A, not to replace it) Only use your albuterol inhaler as a rescue medication to be used if you can't catch your breath by resting or doing a relaxed purse lip breathing pattern.  - The less you use it, the better it will work when you need it. - Ok to use the inhaler up to 2 puffs  every 4 hours if you must but call for appointment if use goes up over your usual need - Don't leave home without it !!  (think of it like the spare tire for your car)   Plan C = Crisis (instead of Plan B but only if Plan B stops working) - only use your albuterol nebulizer if you first try Plan B and it fails to help > ok to use the nebulizer up to every 4 hours but if start needing it regularly call for immediate appointment  Ok to try albuterol 15 min before an activity (on alternating days)  that you know would usually make you short of breath and see if it makes any difference and if makes none then don't take albuterol after activity unless you can't catch your breath as this means it's the resting that helps, not the albuterol.      Make sure you check your oxygen saturation  AT  your highest level of activity (not after you stop)   to be sure it stays over 90% and adjust  02 flow upward to maintain this level if needed but remember to turn it back to previous settings when you stop (to conserve your supply).    Please schedule a follow up visit in 3 months but call sooner if needed

## 2022-07-08 NOTE — Assessment & Plan Note (Signed)
Active smoker  - PFT's  04/08/2006  FEV1 1.32 (51 % ) ratio 0.53  p 8 % improvement from saba p ? prior to study with   A  FV curve classic moderate concavity   06/10/2018  After extensive coaching inhaler device  effectiveness =   50% from a baseline of 0   - 05/25/2020  After extensive coaching inhaler device,  effectiveness =    75% (poor insp flow, short Ti) > try stiolto 2 puffs each am  > preferred spiriva 1.25/ serevent  - PFT's  09/26/2020  FEV1 0.79 (38 % ) ratio 0.49  p 0 % improvement from saba p servent/spiriva 1.25 (very last dose in device) prior to study with DLCO  7.67 (38%) corrects to 2.84 (66%)  for alv volume and FV curve classically concave exp loop  - 07/08/2022  After extensive coaching inhaler device,  effectiveness =   25% MDI baseline > 75% with training   Pt is Group B in terms of symptom/risk and laba/lama therefore appropriate rx at this point >>>  stiolto 2 each am and approp saba:  Re SABA :  I spent extra time with pt today reviewing appropriate use of albuterol for prn use on exertion with the following points: 1) saba is for relief of sob that does not improve by walking a slower pace or resting but rather if the pt does not improve after trying this first. 2) If the pt is convinced, as many are, that saba helps recover from activity faster then it's easy to tell if this is the case by re-challenging : ie stop, take the inhaler, then p 5 minutes try the exact same activity (intensity of workload) that just caused the symptoms and see if they are substantially diminished or not after saba 3) if there is an activity that reproducibly causes the symptoms, try the saba 15 min before the activity on alternate days   If in fact the saba really does help, then fine to continue to use it prn but advised may need to look closer at the maintenance regimen being used to achieve better control of airways disease with exertion.

## 2022-07-08 NOTE — Progress Notes (Signed)
[name] [name], female    DOB: 07/16/1961     MRN: [id number]   Synopsis: 66 yobf active smoker  1st referred in 2017 for evaluation of COPD/  2016 pulmonary function testing from [facility] showed FEV1 of 870 mL (37% predicted) consistent with severe airflow obstruction 2015 CT scan from [facility] showed severe panlobular emphysema She is prescribed 2 L O2 qHS       06/10/2018  Acute extended  ov/Marlia Schewe re: post ER eval  06/08/18 dx aecopd/bronchitis rx zpak  / pt with GOLD III criteria / still smoking Chief Complaint  Patient presents with   Acute Visit    hemoptysis since 06/08/18.    Dyspnea:  MMRC2 = can't walk a nl pace on a flat grade s sob but does fine slow and flat does [employer] 0k s 02 and that hasn't changed since onset of hemoptysis Cough: acute onset  on 06/08/18 mostly mucoid with streaky hemoptysis assoc with lots of nasal congestion but no purulent / bloody d/c  SABA use: has it and says helps but hfa at 0% effective baseline technique rec Prednisone 10 mg take  4 each am x 2 days,   2 each am x 2 days,  1 each am x 2 days and stop  Finish your antibiotics  Try change spiriva to the respimat 1.25  X 4 pffs each am - no change on serevent for now  Work on inhaler technique: e only use your albuterol (PROVENTIL)  as a rescue medication           03/28/2022  f/u ov/Kallen Mccrystal re: GOLD 3 copd / RML atx   maint on  stiolto  Chief Complaint  Patient presents with   Follow-up    Follow up. Patient has no complaints.   Dyspnea:  [employer] walking, uses [employer] parking  Cough: minimal  Sleeping: bed is flat/ two pillows  SABA use: maybe  twice daily usually p exerts 02: 3lpm at hs / no portable  Covid status:  all vax  Rec Plan A = Automatic = Always=    Stiolto  2 puffs each am  Work on inhaler technique:    - remember how the golfers take practice swings Plan B = Backup (to supplement plan A, not to replace it) Only use your albuterol inhaler as a rescue medication Plan C = Crisis  (instead of Plan B but only if Plan B stops working) - only use your albuterol nebulizer if you first try Plan B  Ok to try albuterol 15 min before an activity (on alternating days)  that you know would usually make you short of breath     Please schedule a follow up visit in 3 months but call sooner if needed   12/18/2021 referred for best fit> done 04/29/22 needed 4lpm pulse with walking, rec 5lpm pulse with heavier exertion and ML6 refillable tanks     07/08/2022  f/u ov/Breely Panik re: GOLD 3 but 02 dep maint on stiolto   Chief Complaint  Patient presents with   Follow-up    Breathing is about the same. She denies new co's. She is using her albuterol inhaler 2-3 x per wk on average.   Dyspnea:  any change [employer]/ uses [employer] parking / walks around block/ [place]  Cough: none  Sleeping: flat bed  2 pillows  SABA use: uses before ex - not neb needed  02:  3lpm hs   and none at rest/ up to  5 lpm with exertion      No obvious day to day or daytime variability or assoc excess/ purulent sputum or mucus plugs or hemoptysis or cp or chest tightness, subjective wheeze or overt sinus or hb symptoms.   sleeping without nocturnal  or early am exacerbation  of respiratory  c/o's or need for noct saba. Also denies any obvious fluctuation of symptoms with weather or environmental changes or other aggravating or alleviating factors except as outlined above   No unusual exposure hx or h/o childhood pna/ asthma or knowledge of premature birth.  Current Allergies, Complete Past Medical History, Past Surgical History, Family History, and Social History were reviewed in [employer] record.  ROS  The following are not active complaints unless bolded Hoarseness, sore throat, dysphagia, dental problems, itching, sneezing,  nasal congestion or discharge of excess mucus or purulent secretions, ear ache,   fever, chills, sweats, unintended wt loss or wt gain, classically pleuritic or exertional  cp,  orthopnea pnd or arm/hand swelling  or leg swelling, presyncope, palpitations, abdominal pain, anorexia, nausea, vomiting, diarrhea  or change in bowel habits or change in bladder habits, change in stools or change in urine, dysuria, hematuria,  rash, arthralgias, visual complaints, headache, numbness, weakness or ataxia or problems with walking or coordination,  change in mood or  memory.        Current Meds  Medication Sig   acetaminophen (TYLENOL) 325 MG tablet Take 325-650 mg by mouth every 6 (six) hours as needed (pain).   albuterol (PROVENTIL) (2.5 MG/3ML) 0.083% nebulizer solution Take 3 mLs (2.5 mg total) by nebulization every 6 (six) hours as needed for wheezing or shortness of breath.   albuterol (VENTOLIN HFA) 108 (90 Base) MCG/ACT inhaler Inhale 2 puffs into the lungs every 6 (six) hours as needed for wheezing or shortness of breath.   amLODipine (NORVASC) 5 MG tablet Take 5 mg by mouth every morning.   buPROPion (WELLBUTRIN SR) 100 MG 12 hr tablet Take 100 mg by mouth 2 (two) times daily.     colchicine 0.6 MG tablet Take 1 tablet (0.6 mg total) by mouth daily. Take 2 tablets today as soon as you pick up the prescription.  And then 1 more tablet tonight before bed.  If your pain is persisting when you return tomorrow morning continue to take 1 tablet in the morning and 1 at night until your pain resolves. (Patient taking differently: Take 0.6 mg by mouth daily. Take 2 tablets today as soon as you pick up the prescription.  And then 1 more tablet tonight before bed.  If your pain is persisting when you return tomorrow morning continue to take 1 tablet in the morning and 1 at night until your pain resolves.)   denosumab (PROLIA) 60 MG/ML SOSY injection Inject 60 mg into the skin every 6 (six) months.   hydroxychloroquine (PLAQUENIL) 200 MG tablet Take 1 tablet (200 mg total) by mouth 2 (two) times daily.   Multiple Vitamin (MULTIVITAMIN WITH MINERALS) TABS tablet Take 1 tablet by mouth  every morning.   OXYGEN Inhale 5 L into the lungs continuous.   Tiotropium Bromide-Olodaterol (STIOLTO RESPIMAT) 2.5-2.5 MCG/ACT AERS Inhale 2 puffs into the lungs daily.       .                 Objective:     Wts  07/08/2022       131  03/28/2022  134  12/18/2021         133  09/10/2021      136  09/26/2020     132 05/25/2020       131  06/10/18 129 lb 9.6 oz (58.8 kg)  03/17/18 133 lb 12.8 oz (60.7 kg)  01/08/18 138 lb (62.6 kg)     Vital signs reviewed  07/08/2022  - Note at rest 02 sats  90% on RA   General appearance:    amb bf nad    HEENT :  Oropharynx  clear   Nasal turbinates nl    NECK :  without JVD/Nodes/TM/ nl carotid upstrokes bilaterally   LUNGS: no acc muscle use,  Mod barrel  contour chest wall with bilateral  Distant bs s audible wheeze and  without cough on insp or exp maneuvers and mod  Hyperresonant  to  percussion bilaterally     CV:  RRR  no s3 or murmur or increase in P2, and no edema   ABD:  soft and nontender with pos mid insp Hoover's  in the supine position. No bruits or organomegaly appreciated, bowel sounds nl  MS:   Ext warm without deformities or   obvious joint restrictions , calf tenderness, cyanosis or clubbing  SKIN: warm and dry without lesions    NEURO:  alert, approp, nl sensorium with  no motor or cerebellar deficits apparent.                      Assessment

## 2022-07-08 NOTE — Assessment & Plan Note (Addendum)
On hs since  09/26/2020  - as of 09/10/2021 3lpm hs - amb 02 study 09/10/2021 :  Required 4lpm cont to keep sats > 90% with 86% on room air  - 12/18/2021 referred for best fit> done 04/29/22 needed 4lpm pulse with walking, rec 5lpm pulse with heavier exertion and ML6 refillable tanks   Warned re use of 02 and smoking  Advised: Make sure you check your oxygen saturation  AT  your highest level of activity (not after you stop)   to be sure it stays over 90% and adjust  02 flow upward to maintain this level if needed but remember to turn it back to previous settings when you stop (to conserve your supply).

## 2022-07-08 NOTE — Assessment & Plan Note (Signed)
Counseled re importance of smoking cessation but did not meet time criteria for separate billing            Each maintenance medication was reviewed in detail including emphasizing most importantly the difference between maintenance and prns and under what circumstances the prns are to be triggered using an action plan format where appropriate.  Total time for H and P, chart review, counseling, reviewing hfa/smi/neb/02 device(s) and generating customized AVS unique to this office visit / same day charting  > 30 min for multiple  refractory respiratory  Problems

## 2022-08-15 ENCOUNTER — Other Ambulatory Visit: Payer: Self-pay | Admitting: *Deleted

## 2022-08-15 MED ORDER — STIOLTO RESPIMAT 2.5-2.5 MCG/ACT IN AERS: 2.0000 | INHALATION_SPRAY | Freq: Every day | RESPIRATORY_TRACT | 5 refills | Status: DC

## 2022-10-07 ENCOUNTER — Other Ambulatory Visit: Payer: Self-pay | Admitting: Internal Medicine

## 2022-10-07 ENCOUNTER — Ambulatory Visit
Admission: RE | Admit: 2022-10-07 | Discharge: 2022-10-07 | Disposition: A | Discharge status: Home / Self Care | Payer: Medicare Other | Source: Ambulatory Visit | Attending: Internal Medicine | Admitting: Internal Medicine

## 2022-10-07 DIAGNOSIS — M5440 Lumbago with sciatica, unspecified side: Secondary | ICD-10-CM

## 2022-10-13 NOTE — Progress Notes (Unsigned)
[name] [name], female    DOB: 07/16/1961     MRN: [id number]   Synopsis: 66 yobf active smoker  1st referred in 2017 for evaluation of COPD/  2016 pulmonary function testing from [facility] showed FEV1 of 870 mL (37% predicted) consistent with severe airflow obstruction 2015 CT scan from [facility] showed severe panlobular emphysema She is prescribed 2 L O2 qHS       06/10/2018  Acute extended  ov/[name] [name] re: post ER eval  06/08/18 dx aecopd/bronchitis rx zpak  / pt with GOLD III criteria / still smoking Chief Complaint  Patient presents with   Acute Visit    hemoptysis since 06/08/18.    Dyspnea:  MMRC2 = can't walk a nl pace on a flat grade s sob but does fine slow and flat does [employer] 0k s 02 and that hasn't changed since onset of hemoptysis Cough: acute onset  on 06/08/18 mostly mucoid with streaky hemoptysis assoc with lots of nasal congestion but no purulent / bloody d/c  SABA use: has it and says helps but hfa at 0% effective baseline technique rec Prednisone 10 mg take  4 each am x 2 days,   2 each am x 2 days,  1 each am x 2 days and stop  Finish your antibiotics  Try change spiriva to the respimat 1.25  X 4 pffs each am - no change on serevent for now  Work on inhaler technique: e only use your albuterol (PROVENTIL)  as a rescue medication           03/28/2022  f/u ov/[name] [name] re: GOLD 3 copd / RML atx   maint on  stiolto  Chief Complaint  Patient presents with   Follow-up    Follow up. Patient has no complaints.   Dyspnea:  [employer] walking, uses [employer] parking  Cough: minimal  Sleeping: bed is flat/ two pillows  SABA use: maybe  twice daily usually p exerts 02: 3lpm at hs / no portable  Covid status:  all vax  Rec Plan A = Automatic = Always=    Stiolto  2 puffs each am  Work on inhaler technique:    - remember how the golfers take practice swings Plan B = Backup (to supplement plan A, not to replace it) Only use your albuterol inhaler as a rescue medication Plan C = Crisis  (instead of Plan B but only if Plan B stops working) - only use your albuterol nebulizer if you first try Plan B  Ok to try albuterol 15 min before an activity (on alternating days)  that you know would usually make you short of breath     Please schedule a follow up visit in 3 months but call sooner if needed   12/18/2021 referred for best fit> done 04/29/22 needed 4lpm pulse with walking, rec 5lpm pulse with heavier exertion and ML6 refillable tanks     07/08/2022  f/u ov/[name] [name] re: GOLD 3 but 02 dep maint on stiolto   Chief Complaint  Patient presents with   Follow-up    Breathing is about the same. She denies new co's. She is using her albuterol inhaler 2-3 x per wk on average.   Dyspnea:  any change [employer]/ uses [employer] parking / walks around block/ [place]  Cough: none  Sleeping: flat bed  2 pillows  SABA use: uses before ex - not neb needed  02:  3lpm hs   and none at rest/ up to  5 lpm with exertion  Rec Plan A = Automatic = Always=    Stiolto 2 puffs each am  Work on inhaler technique:  Plan B = Backup (to supplement plan A, not to replace it) Only use your albuterol inhaler as a rescue medication  Plan C = Crisis (instead of Plan B but only if Plan B stops working) - only use your albuterol nebulizer if you first try [place] to try albuterol 15 min before an activity (on alternating days)  that you know would usually make you short of breath Make sure you check your oxygen saturation  AT  your highest level of activity (not after you stop)   to be sure it stays over 90%   10/14/2022  f/u ov/[name] [name] re: GOLD 3/02 dep hs and exercise/  maint on stiolto   No chief complaint on file.  Dyspnea:  some walking around block x 10-15 min on 2lpm  Cough: minimal no am flares Sleeping: no noct symptoms, flat bed 2  pillowss  SABA use: not much  02: 3lpm at bedtime, not needed at rest    Lung cancer screening :  on program     No obvious day to day or daytime variability or assoc  excess/ purulent sputum or mucus plugs or hemoptysis or cp or chest tightness, subjective wheeze or overt sinus or hb symptoms.   sleeping without nocturnal  or early am exacerbation  of respiratory  c/o's or need for noct saba. Also denies any obvious fluctuation of symptoms with weather or environmental changes or other aggravating or alleviating factors except as outlined above   No unusual exposure hx or h/o childhood pna/ asthma or knowledge of premature birth.  Current Allergies, Complete Past Medical History, Past Surgical History, Family History, and Social History were reviewed in [employer] record.  ROS  The following are not active complaints unless bolded Hoarseness, sore throat, dysphagia, dental problems, itching, sneezing,  nasal congestion or discharge of excess mucus or purulent secretions, ear ache,   fever, chills, sweats, unintended wt loss or wt gain, classically pleuritic or exertional cp,  orthopnea pnd or arm/hand swelling  or leg swelling, presyncope, palpitations, abdominal pain, anorexia, nausea, vomiting, diarrhea  or change in bowel habits or change in bladder habits, change in stools or change in urine, dysuria, hematuria,  rash, arthralgias, visual complaints, headache, numbness, weakness or ataxia or problems with walking or coordination,  change in mood or  memory.        Current Meds  Medication Sig   acetaminophen (TYLENOL) 325 MG tablet Take 325-650 mg by mouth every 6 (six) hours as needed (pain).   albuterol (PROVENTIL) (2.5 MG/3ML) 0.083% nebulizer solution Take 3 mLs (2.5 mg total) by nebulization every 6 (six) hours as needed for wheezing or shortness of breath.   albuterol (VENTOLIN HFA) 108 (90 Base) MCG/ACT inhaler Inhale 2 puffs into the lungs every 6 (six) hours as needed for wheezing or shortness of breath.   amLODipine (NORVASC) 5 MG tablet Take 5 mg by mouth every morning.   buPROPion (WELLBUTRIN SR) 100 MG 12 hr tablet Take  100 mg by mouth 2 (two) times daily.     colchicine 0.6 MG tablet Take 1 tablet (0.6 mg total) by mouth daily. Take 2 tablets today as soon as you pick up the prescription.  And then 1 more tablet tonight before bed.  If your pain is persisting when you return tomorrow morning continue  to take 1 tablet in the morning and 1 at night until your pain resolves. (Patient taking differently: Take 0.6 mg by mouth daily. Take 2 tablets today as soon as you pick up the prescription.  And then 1 more tablet tonight before bed.  If your pain is persisting when you return tomorrow morning continue to take 1 tablet in the morning and 1 at night until your pain resolves.)   denosumab (PROLIA) 60 MG/ML SOSY injection Inject 60 mg into the skin every 6 (six) months.   hydroxychloroquine (PLAQUENIL) 200 MG tablet Take 1 tablet (200 mg total) by mouth 2 (two) times daily.   Multiple Vitamin (MULTIVITAMIN WITH MINERALS) TABS tablet Take 1 tablet by mouth every morning.   OXYGEN Inhale 5 L into the lungs continuous.   Tiotropium Bromide-Olodaterol (STIOLTO RESPIMAT) 2.5-2.5 MCG/ACT AERS Inhale 2 puffs into the lungs daily.                         Objective:     Wts  10/14/2022      130  07/08/2022       131  03/28/2022       134  12/18/2021         133  09/10/2021      136  09/26/2020     132 05/25/2020       131  06/10/18 129 lb 9.6 oz (58.8 kg)  03/17/18 133 lb 12.8 oz (60.7 kg)  01/08/18 138 lb (62.6 kg)    Vital signs reviewed  10/14/2022  - Note at rest 02 sats  95% on RA   General appearance:    amb bf nad    HEENT :  Oropharynx  clear   Nasal turbinates nl    NECK :  without JVD/Nodes/TM/ nl carotid upstrokes bilaterally   LUNGS: no acc muscle use,  Mod barrel  contour chest wall with bilateral  Distant bs s audible wheeze and  without cough on insp or exp maneuvers and mod  Hyperresonant  to  percussion bilaterally     CV:  RRR  no s3 or murmur or increase in P2, and no edema   ABD:  soft  and nontender with pos mid insp Hoover's  in the supine position. No bruits or organomegaly appreciated, bowel sounds nl  MS:   Ext warm without deformities or   obvious joint restrictions , calf tenderness, cyanosis or clubbing  SKIN: warm and dry without lesions    NEURO:  alert, approp, nl sensorium with  no motor or cerebellar deficits apparent.                      Assessment

## 2022-10-14 ENCOUNTER — Encounter: Payer: Self-pay | Admitting: Internal Medicine

## 2022-10-14 ENCOUNTER — Ambulatory Visit (INDEPENDENT_AMBULATORY_CARE_PROVIDER_SITE_OTHER): Payer: Medicare Other | Admitting: Internal Medicine

## 2022-10-14 DIAGNOSIS — J9611 Chronic respiratory failure with hypoxia: Secondary | ICD-10-CM

## 2022-10-14 DIAGNOSIS — J449 Chronic obstructive pulmonary disease, unspecified: Secondary | ICD-10-CM

## 2022-10-14 MED ORDER — STIOLTO RESPIMAT 2.5-2.5 MCG/ACT IN AERS: 2.0000 | INHALATION_SPRAY | Freq: Every day | RESPIRATORY_TRACT | 12 refills | Status: DC

## 2022-10-14 NOTE — Patient Instructions (Signed)
Work on inhaler technique:  relax and gently blow all the way out then take a nice smooth full deep breath back in, triggering the inhaler at same time you start breathing in.  Hold breath in for at least  5 seconds if you can.   Rinse and gargle with water when done.  If mouth or throat bother you at all,  try brushing teeth/gums/tongue with arm and hammer toothpaste/ make a slurry and gargle and spit out.   Also  Ok to try albuterol 15 min before an activity (on alternating days)  that you know would usually make you short of breath and see if it makes any difference and if makes none then don't take albuterol after activity unless you can't catch your breath as this means it's the resting that helps, not the albuterol.  Please schedule a follow up visit in 6  months but call sooner if needed - bring your oxygen

## 2022-10-15 NOTE — Assessment & Plan Note (Signed)
Active smoker  - PFT's  04/08/2006  FEV1 1.32 (51 % ) ratio 0.53  p 8 % improvement from saba p ? prior to study with   A  FV curve classic moderate concavity   06/10/2018  After extensive coaching inhaler device  effectiveness =   50% from a baseline of 0   - 05/25/2020  After extensive coaching inhaler device,  effectiveness =    75% (poor insp flow, short Ti) > try stiolto 2 puffs each am  > preferred spiriva 1.25/ serevent  - PFT's  09/26/2020  FEV1 0.79 (38 % ) ratio 0.49  p 0 % improvement from saba p servent/spiriva 1.25 (very last dose in device) prior to study with DLCO  7.67 (38%) corrects to 2.84 (66%)  for alv volume and FV curve classically concave exp loop  - 10/14/2022  After extensive coaching inhaler device,  effectiveness =    75% from a baseline of 25%  Pt is Group B in terms of symptom/risk and laba/lama therefore appropriate rx at this point >>>  Continue stiolto and approp saba  Re SABA :  I spent extra time with pt today reviewing appropriate use of albuterol for prn use on exertion with the following points: 1) saba is for relief of sob that does not improve by walking a slower pace or resting but rather if the pt does not improve after trying this first. 2) If the pt is convinced, as many are, that saba helps recover from activity faster then it's easy to tell if this is the case by re-challenging : ie stop, take the inhaler, then p 5 minutes try the exact same activity (intensity of workload) that just caused the symptoms and see if they are substantially diminished or not after saba 3) if there is an activity that reproducibly causes the symptoms, try the saba 15 min before the activity on alternate days   If in fact the saba really does help, then fine to continue to use it prn but advised may need to look closer at the maintenance regimen being used to achieve better control of airways disease with exertion.

## 2022-10-15 NOTE — Assessment & Plan Note (Signed)
On hs since  09/26/2020  - as of 09/10/2021 3lpm hs - amb 02 study 09/10/2021 :  Required 4lpm cont to keep sats > 90% with 86% on room air  - 12/18/2021 referred for best fit> done 04/29/22 needed 4lpm pulse with walking, rec 5lpm pulse with heavier exertion and ML6 refillable tanks   Advised Make sure you check your oxygen saturation  AT  your highest level of activity (not after you stop)   to be sure it stays over 90% and adjust  02 flow upward to maintain this level if needed but remember to turn it back to previous settings when you stop (to conserve your supply).   Bring 02 next ov and return with all meds in hand using a trust but verify approach to confirm accurate Medication  Reconciliation The principal here is that until we are certain that the  patients are doing what we've asked, it makes no sense to ask them to do more.           Each maintenance medication was reviewed in detail including emphasizing most importantly the difference between maintenance and prns and under what circumstances the prns are to be triggered using an action plan format where appropriate.  Total time for H and P, chart review, counseling, reviewing smi/hfa/02  device(s) and generating customized AVS unique to this office visit / same day charting = 22 min

## 2022-12-11 ENCOUNTER — Other Ambulatory Visit: Payer: Medicare Other

## 2022-12-11 ENCOUNTER — Ambulatory Visit
Admission: RE | Admit: 2022-12-11 | Discharge: 2022-12-11 | Disposition: A | Discharge status: Home / Self Care | Payer: Medicare Other | Source: Ambulatory Visit | Attending: Acute Care | Admitting: Acute Care

## 2022-12-11 DIAGNOSIS — F17210 Nicotine dependence, cigarettes, uncomplicated: Secondary | ICD-10-CM

## 2022-12-11 DIAGNOSIS — Z87891 Personal history of nicotine dependence: Secondary | ICD-10-CM

## 2022-12-26 ENCOUNTER — Telehealth: Payer: Self-pay | Admitting: Acute Care

## 2022-12-26 DIAGNOSIS — Z122 Encounter for screening for malignant neoplasm of respiratory organs: Secondary | ICD-10-CM

## 2022-12-26 DIAGNOSIS — Z87891 Personal history of nicotine dependence: Secondary | ICD-10-CM

## 2022-12-26 DIAGNOSIS — F17210 Nicotine dependence, cigarettes, uncomplicated: Secondary | ICD-10-CM

## 2022-12-26 NOTE — Telephone Encounter (Signed)
This nodule has waxed and waned. It started at 8.4 mm, then 11.3, then 7.7 and now 9 mm. 12 montrh follow up as radiology recommended.  Please fax results to PCP and order annual scan for 11/2023. Thanks so much

## 2022-12-27 NOTE — Telephone Encounter (Signed)
CT results faxed to PCP. Order placed for 12 mth f/u [facility] CT.

## 2023-02-12 ENCOUNTER — Ambulatory Visit (HOSPITAL_COMMUNITY): Payer: 59 | Admitting: Psychiatry

## 2023-03-12 ENCOUNTER — Ambulatory Visit (HOSPITAL_BASED_OUTPATIENT_CLINIC_OR_DEPARTMENT_OTHER): Payer: 59 | Admitting: Psychiatry

## 2023-03-12 ENCOUNTER — Encounter (HOSPITAL_COMMUNITY): Payer: Self-pay | Admitting: Psychiatry

## 2023-03-12 VITALS — BP 134/87 | HR 95 | Ht 64.5 in | Wt 134.0 lb

## 2023-03-12 DIAGNOSIS — F331 Major depressive disorder, recurrent, moderate: Secondary | ICD-10-CM

## 2023-03-12 DIAGNOSIS — F411 Generalized anxiety disorder: Secondary | ICD-10-CM | POA: Insufficient documentation

## 2023-03-12 MED ORDER — TRAZODONE HCL 50 MG PO TABS: 50.0000 mg | ORAL_TABLET | Freq: Every day | ORAL | 1 refills | Status: DC

## 2023-03-12 MED ORDER — BUPROPION HCL ER (XL) 150 MG PO TB24: 150.0000 mg | ORAL_TABLET | ORAL | 2 refills | Status: DC

## 2023-03-12 MED ORDER — FLUOXETINE HCL 10 MG PO CAPS: 10.0000 mg | ORAL_CAPSULE | Freq: Every day | ORAL | 2 refills | Status: DC
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
## 2023-04-14 NOTE — Progress Notes (Unsigned)
[name] [name], female    DOB: 07/16/1961     MRN: [id number]   Synopsis: 66 yobf active smoker  1st referred in 2017 for evaluation of COPD/  2016 pulmonary function testing from [facility] showed FEV1 of 870 mL (37% predicted) consistent with severe airflow obstruction 2015 CT scan from [facility] showed severe panlobular emphysema She is prescribed 2 L O2 qHS       06/10/2018  Acute extended  ov/[name] [name] re: post ER eval  06/08/18 dx aecopd/bronchitis rx zpak  / pt with GOLD III criteria / still smoking Chief Complaint  Patient presents with   Acute Visit    hemoptysis since 06/08/18.    Dyspnea:  MMRC2 = can't walk a nl pace on a flat grade s sob but does fine slow and flat does [employer] 0k s 02 and that hasn't changed since onset of hemoptysis Cough: acute onset  on 06/08/18 mostly mucoid with streaky hemoptysis assoc with lots of nasal congestion but no purulent / bloody d/c  SABA use: has it and says helps but hfa at 0% effective baseline technique rec Prednisone 10 mg take  4 each am x 2 days,   2 each am x 2 days,  1 each am x 2 days and stop  Finish your antibiotics  Try change spiriva to the respimat 1.25  X 4 pffs each am - no change on serevent for now  Work on inhaler technique: e only use your albuterol (PROVENTIL)  as a rescue medication           03/28/2022  f/u ov/[name] [name] re: GOLD 3 copd / RML atx   maint on  stiolto  Chief Complaint  Patient presents with   Follow-up    Follow up. Patient has no complaints.   Dyspnea:  [employer] walking, uses [employer]c parking  Cough: minimal  Sleeping: bed is flat/ two pillows  SABA use: maybe  twice daily usually p exerts 02: 3lpm at hs / no portable  Covid status:  all vax  Rec Plan A = Automatic = Always=    Stiolto  2 puffs each am  Work on inhaler technique:    - remember how the golfers take practice swings Plan B = Backup (to supplement plan A, not to replace it) Only use your albuterol inhaler as a rescue medication Plan C = Crisis  (instead of Plan B but only if Plan B stops working) - only use your albuterol nebulizer if you first try Plan B  Ok to try albuterol 15 min before an activity (on alternating days)  that you know would usually make you short of breath     Please schedule a follow up visit in 3 months but call sooner if needed   12/18/2021 referred for best fit> done 04/29/22 needed 4lpm pulse with walking, rec 5lpm pulse with heavier exertion and ML6 refillable tanks     07/08/2022  f/u ov/[name] [name] re: GOLD 3 but 02 dep maint on stiolto   Chief Complaint  Patient presents with   Follow-up    Breathing is about the same. She denies new co's. She is using her albuterol inhaler 2-3 x per wk on average.   Dyspnea:  any change [employer]/ uses [employer] parking / walks around block/ [place]  Cough: none  Sleeping: flat bed  2 pillows  SABA use: uses before ex - not neb needed  02:  3lpm hs   and none at rest/ up to  5 lpm with exertion  Rec Plan A = Automatic = Always=    Stiolto 2 puffs each am  Work on inhaler technique:  Plan B = Backup (to supplement plan A, not to replace it) Only use your albuterol inhaler as a rescue medication  Plan C = Crisis (instead of Plan B but only if Plan B stops working) - only use your albuterol nebulizer if you first try Plan B  Ok to try albuterol 15 min before an activity (on alternating days)  that you know would usually make you short of breath Make sure you check your oxygen saturation  AT  your highest level of activity (not after you stop)   to be sure it stays over 90%   10/14/2022  f/u ov/[name] [name] re: GOLD 3/02 dep hs and exercise/  maint on stiolto   No chief complaint on file. Dyspnea:  some walking around block x 10-15 min on 2lpm  Cough: minimal no am flares Sleeping: no noct symptoms, flat bed 2  pillowss  SABA use: not much  02: 3lpm at bedtime, not needed at rest  Rec Work on inhaler technique: Also  Ok to try albuterol 15 min before an activity (on alternating  days)  that you know would usually make you short of breath  Please schedule a follow up visit in 6  months but call sooner if needed - bring your oxygen       04/15/2023  f/u ov/[name] [name] re: ***   maint on ***  No chief complaint on file.   Dyspnea:  *** Cough: *** Sleeping: *** SABA use: *** 02: *** Covid status:   *** Lung cancer screening :  in program due ***     No obvious day to day or daytime variability or assoc excess/ purulent sputum or mucus plugs or hemoptysis or cp or chest tightness, subjective wheeze or overt sinus or hb symptoms.   *** without nocturnal  or early am exacerbation  of respiratory  c/o's or need for noct saba. Also denies any obvious fluctuation of symptoms with weather or environmental changes or other aggravating or alleviating factors except as outlined above   No unusual exposure hx or h/o childhood pna/ asthma or knowledge of premature birth.  Current Allergies, Complete Past Medical History, Past Surgical History, Family History, and Social History were reviewed in [employer] record.  ROS  The following are not active complaints unless bolded Hoarseness, sore throat, dysphagia, dental problems, itching, sneezing,  nasal congestion or discharge of excess mucus or purulent secretions, ear ache,   fever, chills, sweats, unintended wt loss or wt gain, classically pleuritic or exertional cp,  orthopnea pnd or arm/hand swelling  or leg swelling, presyncope, palpitations, abdominal pain, anorexia, nausea, vomiting, diarrhea  or change in bowel habits or change in bladder habits, change in stools or change in urine, dysuria, hematuria,  rash, arthralgias, visual complaints, headache, numbness, weakness or ataxia or problems with walking or coordination,  change in mood or  memory.        No outpatient medications have been marked as taking for the 04/15/23 encounter (Appointment) with [name] Cowden, MD.                           Objective:     Wts  04/15/2023         ***  10/14/2022      130  07/08/2022  131  03/28/2022       134  12/18/2021         133  09/10/2021      136  09/26/2020     132 05/25/2020       131  06/10/18 129 lb 9.6 oz (58.8 kg)  03/17/18 133 lb 12.8 oz (60.7 kg)  01/08/18 138 lb (62.6 kg)    Vital signs reviewed  04/15/2023  - Note at rest 02 sats  ***% on ***   General appearance:    ***     Mod bar ***              Assessment

## 2023-04-15 ENCOUNTER — Ambulatory Visit (INDEPENDENT_AMBULATORY_CARE_PROVIDER_SITE_OTHER): Payer: 59 | Admitting: Internal Medicine

## 2023-04-15 ENCOUNTER — Encounter: Payer: Self-pay | Admitting: Internal Medicine

## 2023-04-15 VITALS — BP 124/72 | HR 79 | Temp 98.1°F | Ht 63.0 in | Wt 127.8 lb

## 2023-04-15 DIAGNOSIS — F17210 Nicotine dependence, cigarettes, uncomplicated: Secondary | ICD-10-CM

## 2023-04-15 DIAGNOSIS — J9611 Chronic respiratory failure with hypoxia: Secondary | ICD-10-CM

## 2023-04-15 DIAGNOSIS — J449 Chronic obstructive pulmonary disease, unspecified: Secondary | ICD-10-CM

## 2023-04-15 NOTE — Assessment & Plan Note (Signed)
4-5 min discussion re active cigarette smoking in addition to office E&M  Ask about tobacco use:   ongoing despite GOLD 3 criteria/ 02 dep  Advise quitting   I took an extended  opportunity with this patient to outline the consequences of continued cigarette use  in airway disorders based on all the data we have from the multiple national lung health studies (perfomed over decades at millions of dollars in cost)  indicating that smoking cessation, not choice of inhalers or pulmonary  physicians, is the most important aspect of her care.   Assess willingness:  Not committed at this point Assist in quit attempt:  Per PCP when ready Arrange follow up:   Follow up per Primary Care planned  For smoking cessation classes call [phone]

## 2023-04-15 NOTE — Patient Instructions (Signed)
No change in medication  The key is to stop smoking completely before smoking completely stops you!   Keep appt for your lung scan   Please schedule a follow up visit in 12 months but call sooner if needed

## 2023-04-15 NOTE — Assessment & Plan Note (Addendum)
On hs since  09/26/2020  - as of 09/10/2021 3lpm hs - amb 02 study 09/10/2021 :  Required 4lpm cont to keep sats > 90% with 86% on room air  - 12/18/2021 referred for best fit> done 04/29/22 needed 4lpm pulse with walking, rec 5lpm pulse with heavier exertion and ML6 refillable tanks   Advised: Make sure you check your oxygen saturation  AT  your highest level of activity (not after you stop)   to be sure it stays over 90% and adjust  02 flow upward to maintain this level if needed but remember to turn it back to previous settings when you stop (to conserve your supply).          Each maintenance medication was reviewed in detail including emphasizing most importantly the difference between maintenance and prns and under what circumstances the prns are to be triggered using an action plan format where appropriate.  Total time for H and P, chart review, counseling, reviewing SMI/ hfa devices and generating customized AVS unique to this office visit / same day charting  > 20 min

## 2023-04-15 NOTE — Assessment & Plan Note (Signed)
Active smoker  - PFT's  04/08/2006  FEV1 1.32 (51 % ) ratio 0.53  p 8 % improvement from saba p ? prior to study with   A  FV curve classic moderate concavity   06/10/2018  After extensive coaching inhaler device  effectiveness =   50% from a baseline of 0   - 05/25/2020  After extensive coaching inhaler device,  effectiveness =    75% (poor insp flow, short Ti) > try stiolto 2 puffs each am  > preferred spiriva 1.25/ serevent  - PFT's  09/26/2020  FEV1 0.79 (38 % ) ratio 0.49  p 0 % improvement from saba p servent/spiriva 1.25 (very last dose in device) prior to study with DLCO  7.67 (38%) corrects to 2.84 (66%)  for alv volume and FV curve classically concave exp loop  - 10/14/2022  After extensive coaching inhaler device,  effectiveness =    75% from a baseline of 25%  Pt is Group B in terms of symptom/risk and laba/lama therefore appropriate rx at this point >>>  stiolto and prn saba

## 2023-04-16 ENCOUNTER — Ambulatory Visit (HOSPITAL_COMMUNITY): Payer: 59 | Admitting: Psychiatry

## 2023-04-21 ENCOUNTER — Ambulatory Visit (HOSPITAL_BASED_OUTPATIENT_CLINIC_OR_DEPARTMENT_OTHER): Payer: 59 | Admitting: Psychiatry

## 2023-04-21 ENCOUNTER — Encounter (HOSPITAL_COMMUNITY): Payer: Self-pay | Admitting: Psychiatry

## 2023-04-21 DIAGNOSIS — F331 Major depressive disorder, recurrent, moderate: Secondary | ICD-10-CM | POA: Diagnosis not present

## 2023-04-21 DIAGNOSIS — F411 Generalized anxiety disorder: Secondary | ICD-10-CM | POA: Diagnosis not present

## 2023-04-21 MED ORDER — TRAZODONE HCL 50 MG PO TABS: 25.0000 mg | ORAL_TABLET | Freq: Every day | ORAL | 1 refills | Status: DC

## 2023-04-21 MED ORDER — BUPROPION HCL ER (XL) 150 MG PO TB24: 150.0000 mg | ORAL_TABLET | ORAL | 2 refills | Status: DC

## 2023-04-21 MED ORDER — FLUOXETINE HCL 10 MG PO CAPS: 10.0000 mg | ORAL_CAPSULE | Freq: Every day | ORAL | 2 refills | Status: DC

## 2023-04-21 NOTE — Progress Notes (Signed)
BH MD/PA/NP OP Progress Note  04/21/2023 2:15 PM [name] [name]  MRN:  [id number]  Visit Diagnosis:    ICD-10-CM   1. GAD (generalized anxiety disorder)  F41.1 buPROPion (WELLBUTRIN XL) 150 MG 24 hr tablet    traZODone (DESYREL) 50 MG tablet    FLUoxetine (PROZAC) 10 MG capsule    2. Moderate episode of recurrent major depressive disorder (HCC)  F33.1 buPROPion (WELLBUTRIN XL) 150 MG 24 hr tablet    traZODone (DESYREL) 50 MG tablet    FLUoxetine (PROZAC) 10 MG capsule      Assessment: [name] [name] is a 62 y.o. female with a history of MDD, COPD who presented in person to [facility] for initial evaluation on 03/12/2023.    At initial evaluation patient reported symptoms of low mood, anhedonia, amotivation, poor sleep, weight loss, some negative self thoughts, and passive SI.  She denied any intent or plan to act on it.  Latesa also endorsed significant symptoms of anxiety including constant worry that she is unable to control, fear of something awful happening, restlessness, difficulty relaxing, and racing thoughts.  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  Patient met criteria for MDD and GAD.  [name] [name] presents for follow-up evaluation. Today, 04/21/23, patient reports improvement in her mood and insomnia symptoms over the past month after changing Wellbutrin to extended release and starting trazodone.  The 50 mg dose of trazodone had been over sedating the following day however that improved after decreasing to 25 mg at bedtime.  She denies any other adverse side effects from the medication.  Of note patient has reported some weight loss despite increased appetite and eating.  While Wellbutrin cause weight loss it is typically secondary due to the decrease in appetite.  We recommended connecting  with her PCP to explore this further.  Plan: - Continue Wellbutrin Xl 150 mg QD - Continue Prozac 10 mg QD - Decrease Trazodone 25 mg QHS - Can consider therapy referral in the future - Crisis resources reviewed - Follow up in 2 months   Chief Complaint:  Chief Complaint  Patient presents with   Follow-up   HPI: [name] [name] presents for reporting that she has been doing alright over the past and a half.  With the change in Wellbutrin from short-acting to long-acting she reports that she has been feeling more motivated with improved mood.  She also denies any passive SI at this time.  As for sleep she did start the trazodone to 50 mg however found it to be over sedating the following day.  After decreasing the dose to 25 mg at bedtime patient's over sedation improved.  She now reports sleeping well throughout the night.  She did not reach out to the therapist yet however is uncertain whether it still needed.  Patient notes that her stressors can be related to work and that currently they have not been overly significant.  She was informed she can always reach out for therapy in the future if needed.  Patient was also informed that it could take time to establish with a provider.  Past Psychiatric History: Patient had connected with outpatient psychiatrist in the late 1990s named Dr. Laureen Ochs at the [facility]  She followed with him for about 5 years before discontinuing.  She believes she was prescribed trazodone at that  time and discontinued it after finishing with him.  Patient's PCP had tried bupropion standard release twice daily.  Currently on Wellbutrin XL, Prozac, and trazodone  Patient has significant history of past substance use primarily cocaine and marijuana occurred around 20 to 30 years ago.  She has discontinued that since.  Currently she endorses alcohol use around 1-2 times a week and nicotine use.  She denies any other substance use.  Past Medical History:  Past Medical  History:  Diagnosis Date   Anemia    Anxiety    Arthritis    OSTEOARTHRITIS   Blood dyscrasia    EASY BLEEDER   Colon polyps    COPD (chronic obstructive pulmonary disease) (HCC)    Depression    Osteoporosis    Seasonal allergies    Sickle cell trait (HCC)    Uterine fibroid     Past Surgical History:  Procedure Laterality Date   CESAREAN SECTION  1980   DILATATION & CURETTAGE/HYSTEROSCOPY WITH MYOSURE N/A 09/30/2017   Procedure: DILATATION & CURETTAGE/HYSTEROSCOPY WITH MYOSURE MYOMECTOMY;  Surgeon: Geryl Rankins, MD;  Location: [facility];  Service: Gynecology;  Laterality: N/A;  PMB    Family History:  Family History  Problem Relation Age of Onset   Stomach cancer Father 65   Throat cancer Brother    Heart disease Mother    Breast cancer Neg Hx     Social History:  Social History   Socioeconomic History   Marital status: Single    Spouse name: Not on file   Number of children: 1   Years of education: Not on file   Highest education level: Not on file  Occupational History   Not on file  Tobacco Use   Smoking status: Some Days    Packs/day: 0.50    Years: 42.00    Additional pack years: 0.00    Total pack years: 21.00    Types: Cigarettes   Smokeless tobacco: Never  Vaping Use   Vaping Use: Never used  Substance and Sexual Activity   Alcohol use: Yes    Alcohol/week: 0.0 standard drinks of alcohol    Comment: occasional   Drug use: No   Sexual activity: Not on file  Other Topics Concern   Not on file  Social History Narrative   ** Merged History Encounter **       Social Determinants of Health   Financial Resource Strain: Not on file  Food Insecurity: Not on file  Transportation Needs: Not on file  Physical Activity: Not on file  Stress: Not on file  Social Connections: Not on file    Allergies:  Allergies  Allergen Reactions   Clobetasol Emul Foam W-Moistcr     Sore throat, trouble breathing, swallowing,feeling sleepy, more hungry,  passing urine more often,and weight loss    Current Medications: Current Outpatient Medications  Medication Sig Dispense Refill   acetaminophen (TYLENOL) 325 MG tablet Take 325-650 mg by mouth every 6 (six) hours as needed (pain).     albuterol (PROVENTIL) (2.5 MG/3ML) 0.083% nebulizer solution Take 3 mLs (2.5 mg total) by nebulization every 6 (six) hours as needed for wheezing or shortness of breath. 360 mL 12   albuterol (VENTOLIN HFA) 108 (90 Base) MCG/ACT inhaler Inhale 2 puffs into the lungs every 6 (six) hours as needed for wheezing or shortness of breath. 1 g 3   amLODipine (NORVASC) 5 MG tablet Take 5 mg by mouth every morning.     buPROPion (WELLBUTRIN XL)  150 MG 24 hr tablet Take 1 tablet (150 mg total) by mouth every morning. 30 tablet 2   colchicine 0.6 MG tablet Take 1 tablet (0.6 mg total) by mouth daily. Take 2 tablets today as soon as you pick up the prescription.  And then 1 more tablet tonight before bed.  If your pain is persisting when you return tomorrow morning continue to take 1 tablet in the morning and 1 at night until your pain resolves. (Patient taking differently: Take 0.6 mg by mouth daily. Take 2 tablets today as soon as you pick up the prescription.  And then 1 more tablet tonight before bed.  If your pain is persisting when you return tomorrow morning continue to take 1 tablet in the morning and 1 at night until your pain resolves.) 14 tablet 0   denosumab (PROLIA) 60 MG/ML SOSY injection Inject 60 mg into the skin every 6 (six) months.     FLUoxetine (PROZAC) 10 MG capsule Take 1 capsule (10 mg total) by mouth daily. 30 capsule 2   hydroxychloroquine (PLAQUENIL) 200 MG tablet Take 1 tablet (200 mg total) by mouth 2 (two) times daily. (Patient not taking: Reported on 04/15/2023) 60 tablet 1   Multiple Vitamin (MULTIVITAMIN WITH MINERALS) TABS tablet Take 1 tablet by mouth every morning.     OXYGEN Inhale 5 L into the lungs continuous.     Tiotropium Bromide-Olodaterol  (STIOLTO RESPIMAT) 2.5-2.5 MCG/ACT AERS Inhale 2 puffs into the lungs daily. 4 g 12   traZODone (DESYREL) 50 MG tablet Take 0.5 tablets (25 mg total) by mouth at bedtime. 30 tablet 1   No current facility-administered medications for this visit.     Musculoskeletal: Strength & Muscle Tone: within normal limits Gait & Station: normal Patient leans: N/A  Psychiatric Specialty Exam: Review of Systems  There were no vitals taken for this visit.There is no height or weight on file to calculate BMI.  General Appearance: Fairly Groomed  Eye Contact:  Good  Speech:  Clear and Coherent and Normal Rate  Volume:  Normal  Mood:  Euthymic  Affect:  Congruent  Thought Process:  Coherent  Orientation:  Full (Time, Place, and Person)  Thought Content: Logical   Suicidal Thoughts:  No  Homicidal Thoughts:  No  Memory:  Immediate;   Good  Judgement:  Fair  Insight:  Fair  Psychomotor Activity:  Normal  Concentration:  Concentration: Good  Recall:  Good  Fund of Knowledge: Fair  Language: Good  Akathisia:  NA    AIMS (if indicated): not done  Assets:  Tax adviser  ADL's:  Intact  Cognition: WNL  Sleep:  Good   Metabolic Disorder Labs: No results found for: "HGBA1C", "MPG" No results found for: "PROLACTIN" No results found for: "CHOL", "TRIG", "HDL", "CHOLHDL", "VLDL", "LDLCALC" No results found for: "TSH"  Therapeutic Level Labs: No results found for: "LITHIUM" No results found for: "VALPROATE" No results found for: "CBMZ"   Screenings: GAD-7    Flowsheet Row [facility]  Total GAD-7 Score 12      PHQ2-9    Flowsheet Row [facility] Visit from 03[facility]12/2023 in [facility]  PHQ-2 Total Score 2  PHQ-9 Total Score 10      Flowsheet Row [facility]12[facility]2023 [facility] from 04/28/2022 in [facility] from 08/10/2021 in  [place] [facility] at [facility]  C-SSRS RISK CATEGORY Low Risk No Risk No Risk       Collaboration of Care: Collaboration of Care: Medication Management AEB medication prescription  Patient/Guardian was advised Release of Information must be obtained prior to any record release in order to collaborate their care with an outside provider. Patient/Guardian was advised if they have not already done so to contact the registration department to sign all necessary forms in order for [place] to release information regarding their care.   Consent: Patient/Guardian gives verbal consent for treatment and assignment of benefits for services provided during this visit. Patient/Guardian expressed understanding and agreed to proceed.    Stasia Cavalier, MD 04/21/2023, 2:15 PM

## 2023-05-19 ENCOUNTER — Encounter (HOSPITAL_COMMUNITY): Payer: Self-pay | Admitting: *Deleted

## 2023-05-19 ENCOUNTER — Other Ambulatory Visit: Payer: Self-pay

## 2023-05-19 ENCOUNTER — Emergency Department (HOSPITAL_COMMUNITY): Payer: 59

## 2023-05-19 ENCOUNTER — Emergency Department (HOSPITAL_COMMUNITY)
Admission: EM | Admit: 2023-05-19 | Discharge: 2023-05-19 | Disposition: A | Discharge status: Home / Self Care | Payer: 59 | Attending: Emergency Medicine | Admitting: Emergency Medicine

## 2023-05-19 DIAGNOSIS — S42021A Displaced fracture of shaft of right clavicle, initial encounter for closed fracture: Secondary | ICD-10-CM | POA: Insufficient documentation

## 2023-05-19 DIAGNOSIS — M25511 Pain in right shoulder: Secondary | ICD-10-CM | POA: Diagnosis present

## 2023-05-19 DIAGNOSIS — J449 Chronic obstructive pulmonary disease, unspecified: Secondary | ICD-10-CM | POA: Insufficient documentation

## 2023-05-19 DIAGNOSIS — W1840XA Slipping, tripping and stumbling without falling, unspecified, initial encounter: Secondary | ICD-10-CM | POA: Insufficient documentation

## 2023-05-19 LAB — BASIC METABOLIC PANEL
Anion gap: 9 (ref 5–15)
BUN: 10 mg/dL (ref 8–23)
CO2: 24 mmol/L (ref 22–32)
Calcium: 8.7 mg/dL — ABNORMAL LOW (ref 8.9–10.3)
Chloride: 96 mmol/L — ABNORMAL LOW (ref 98–111)
Creatinine, Ser: 0.59 mg/dL (ref 0.44–1.00)
GFR, Estimated: >60 mL/min (ref >60)
Glucose, Bld: 105 mg/dL — ABNORMAL HIGH (ref 70–99)
Potassium: 4.3 mmol/L (ref 3.5–5.1)
Sodium: 129 mmol/L — ABNORMAL LOW (ref 135–145)

## 2023-05-19 LAB — CBC WITH DIFFERENTIAL/PLATELET
Abs Immature Granulocytes: 0.02 10*3/uL (ref 0.00–0.07)
Basophils Absolute: 0 10*3/uL (ref 0.0–0.1)
Basophils Relative: 0 %
Eosinophils Absolute: 0 10*3/uL (ref 0.0–0.5)
Eosinophils Relative: 0 %
HCT: 41.9 % (ref 36.0–46.0)
Hemoglobin: 14.7 g/dL (ref 12.0–15.0)
Immature Granulocytes: 0 %
Lymphocytes Relative: 18 %
Lymphs Abs: 1 10*3/uL (ref 0.7–4.0)
MCH: 29.9 pg (ref 26.0–34.0)
MCHC: 35.1 g/dL (ref 30.0–36.0)
MCV: 85.2 fL (ref 80.0–100.0)
Monocytes Absolute: 0.2 10*3/uL (ref 0.1–1.0)
Monocytes Relative: 4 %
Neutro Abs: 4.4 10*3/uL (ref 1.7–7.7)
Neutrophils Relative %: 78 %
Platelets: 233 10*3/uL (ref 150–400)
RBC: 4.92 MIL/uL (ref 3.87–5.11)
RDW: 13.7 % (ref 11.5–15.5)
WBC: 5.7 10*3/uL (ref 4.0–10.5)
nRBC: 0 % (ref 0.0–0.2)

## 2023-05-19 LAB — TROPONIN I (HIGH SENSITIVITY): Troponin I (High Sensitivity): 4 ng/L (ref <18)

## 2023-05-19 MED ORDER — OXYCODONE-ACETAMINOPHEN 5-325 MG PO TABS: 1.0000 | ORAL_TABLET | Freq: Four times a day (QID) | ORAL | 0 refills | Status: DC | PRN

## 2023-05-19 MED ORDER — HYDROCODONE-ACETAMINOPHEN 5-325 MG PO TABS
1.0000 | ORAL_TABLET | Freq: Once | ORAL | Status: AC
  Administered 2023-05-19: 1 via ORAL
  Filled 2023-05-19: qty 1

## 2023-05-19 NOTE — ED Provider Notes (Signed)
[place] EMERGENCY DEPARTMENT AT [facility] Provider Note   CSN: [id number] Arrival date & time: 05/19/23  0243     History  Chief Complaint  Patient presents with   Shoulder Pain    AILI FIEBELKORN is a 62 y.o. female.  HPI     This is a 62 year old female with chronic respiratory failure who presents with right shoulder pain.  Patient reports that she tripped.  She was able to catch herself without falling and but did not land on her shoulder or catch herself on her arm.  However, she states that since that time she has had right shoulder pain radiating into her chest.  Denies numbness or tingling.  Pain is worse with some range of motion although she also reports chest tightness.  No recent fevers or cough.  Home Medications Prior to Admission medications   Medication Sig Start Date End Date Taking? Authorizing Provider  oxyCODONE-acetaminophen (PERCOCET/ROXICET) 5-325 MG tablet Take 1 tablet by mouth every 6 (six) hours as needed for severe pain. 05/19/23  Yes Jo-Anne Kluth, Mayer Masker, MD  acetaminophen (TYLENOL) 325 MG tablet Take 325-650 mg by mouth every 6 (six) hours as needed (pain).    [provider]  albuterol (PROVENTIL) (2.5 MG/3ML) 0.083% nebulizer solution Take 3 mLs (2.5 mg total) by nebulization every 6 (six) hours as needed for wheezing or shortness of breath. 02/06/16   Lupita Leash, MD  albuterol (VENTOLIN HFA) 108 (90 Base) MCG/ACT inhaler Inhale 2 puffs into the lungs every 6 (six) hours as needed for wheezing or shortness of breath. 07/08/22   [name], MD  amLODipine (NORVASC) 5 MG tablet Take 5 mg by mouth every morning. 05/11/20   [provider]  buPROPion (WELLBUTRIN XL) 150 MG 24 hr tablet Take 1 tablet (150 mg total) by mouth every morning. 04/21/23 04/20/24  Stasia Cavalier, MD  colchicine 0.6 MG tablet Take 1 tablet (0.6 mg total) by mouth daily. Take 2 tablets today as soon as you pick up the prescription.  And then 1 more  tablet tonight before bed.  If your pain is persisting when you return tomorrow morning continue to take 1 tablet in the morning and 1 at night until your pain resolves. Patient taking differently: Take 0.6 mg by mouth daily. Take 2 tablets today as soon as you pick up the prescription.  And then 1 more tablet tonight before bed.  If your pain is persisting when you return tomorrow morning continue to take 1 tablet in the morning and 1 at night until your pain resolves. 09/10/20   Sponseller, Eugene Gavia, PA-C  denosumab (PROLIA) 60 MG/ML SOSY injection Inject 60 mg into the skin every 6 (six) months.    [provider]  FLUoxetine (PROZAC) 10 MG capsule Take 1 capsule (10 mg total) by mouth daily. 04/21/23 04/20/24  Stasia Cavalier, MD  hydroxychloroquine (PLAQUENIL) 200 MG tablet Take 1 tablet (200 mg total) by mouth 2 (two) times daily. Patient not taking: Reported on 04/15/2023 12/11/21   Janalyn Harder, MD  Multiple Vitamin (MULTIVITAMIN WITH MINERALS) TABS tablet Take 1 tablet by mouth every morning.    [provider]  OXYGEN Inhale 5 L into the lungs continuous.    [provider]  Tiotropium Bromide-Olodaterol (STIOLTO RESPIMAT) 2.5-2.5 MCG/ACT AERS Inhale 2 puffs into the lungs daily. 10/14/22   [name], MD  traZODone (DESYREL) 50 MG tablet Take 0.5 tablets (25 mg total) by mouth at  bedtime. 04/21/23   Stasia Cavalier, MD      Allergies    Clobetasol emul foam w-moistcr    Review of Systems   Review of Systems  Constitutional:  Negative for fever.  Respiratory:  Negative for shortness of breath.   Cardiovascular:  Positive for chest pain.  Musculoskeletal:        Shoulder pain  All other systems reviewed and are negative.   Physical Exam Updated Vital Signs BP 130/76   Pulse 87   Temp 97.7 F (36.5 C)   Resp (!) 22   Ht 1.6 m (5\' 3" )   Wt 58 kg   SpO2 94%   BMI 22.65 kg/m  Physical Exam Vitals and nursing note reviewed.  Constitutional:       Appearance: She is well-developed.     Comments: Chronically ill-appearing, no acute distress  HENT:     Head: Normocephalic and atraumatic.     Mouth/Throat:     Mouth: Mucous membranes are moist.  Eyes:     Pupils: Pupils are equal, round, and reactive to light.  Cardiovascular:     Rate and Rhythm: Normal rate and regular rhythm.     Heart sounds: Normal heart sounds.  Pulmonary:     Effort: Pulmonary effort is normal. No respiratory distress.     Breath sounds: Wheezing present.     Comments: Nasal cannula in place Chest:     Chest wall: Tenderness present.  Abdominal:     Palpations: Abdomen is soft.  Musculoskeletal:     Cervical back: Neck supple.     Comments: Pain with range of motion of the right shoulder, no obvious deformities, no overlying skin changes, tenderness extends into the right chest wall  Skin:    General: Skin is warm and dry.  Neurological:     Mental Status: She is alert and oriented to person, place, and time.  Psychiatric:        Mood and Affect: Mood normal.     ED Results / Procedures / Treatments   Labs (all labs ordered are listed, but only abnormal results are displayed) Labs Reviewed  BASIC METABOLIC PANEL - Abnormal; Notable for the following components:      Result Value   Sodium 129 (*)    Chloride 96 (*)    Glucose, Bld 105 (*)    Calcium 8.7 (*)    All other components within normal limits  CBC WITH DIFFERENTIAL/PLATELET  TROPONIN I (HIGH SENSITIVITY)    EKG EKG Interpretation  Date/Time:  Monday May 19 2023 04:02:53 EDT Ventricular Rate:  78 PR Interval:  168 QRS Duration: 97 QT Interval:  399 QTC Calculation: 455 R Axis:   81 Text Interpretation: Sinus rhythm Low voltage, extremity leads Nonspecific repol abnormality, lateral leads ST elevation, consider anterolateral injury Confirmed by Ross Marcus ([id number]) on 05/19/2023 5:12:32 AM  Radiology DG Chest Portable 1 View  Result Date: 05/19/2023 CLINICAL DATA:   Shoulder and neck pain.  Fall injury. EXAM: PORTABLE CHEST 1 VIEW RIGHT SHOULDER 3 VIEWS COMPARISON:  Portable chest 08/10/2021 FINDINGS: Right shoulder, routine three views: There is osteopenia. There is an acute oblique fracture of the distal right clavicle shaft. The distal fragment is superiorly displaced by 1/2 of the shaft width. There are tiny comminution fragments at the inferior fracture margin. No dislocation or further fractures are seen. Joint narrowing and moderate circumferential osteophytosis is seen at the [facility] joint. The glenohumeral joint is unremarkable. There is preservation of  the normal acromiohumeral space. Soft tissues are unremarkable. Chest AP portable, 3:48 a.m.: The lungs are emphysematous with large bullous disease in the right-greater-than-left upper lobes. The lungs are clear of focal infiltrates. No pleural effusion is seen. Heart size and vasculature are normal apart from aortic tortuosity with calcification in the arch. There is no substantial pleural effusion.  No pneumothorax is seen. There is osteopenia and mild thoracic dextroscoliosis. Redemonstrated distal right clavicle fracture. IMPRESSION: 1. Acute oblique fracture of the distal right clavicle shaft with 1/2 of the shaft width superior displacement of the distal fragment and tiny comminution fragments at the inferior fracture margin. No dislocation. 2. No evidence of acute chest process. Emphysema with large upper lobe bullous disease. Electronically Signed   By: Almira Bar M.D.   On: 05/19/2023 04:31   DG Shoulder Right Portable  Result Date: 05/19/2023 CLINICAL DATA:  Shoulder and neck pain.  Fall injury. EXAM: PORTABLE CHEST 1 VIEW RIGHT SHOULDER 3 VIEWS COMPARISON:  Portable chest 08/10/2021 FINDINGS: Right shoulder, routine three views: There is osteopenia. There is an acute oblique fracture of the distal right clavicle shaft. The distal fragment is superiorly displaced by 1/2 of the shaft width. There are tiny  comminution fragments at the inferior fracture margin. No dislocation or further fractures are seen. Joint narrowing and moderate circumferential osteophytosis is seen at the [facility] joint. The glenohumeral joint is unremarkable. There is preservation of the normal acromiohumeral space. Soft tissues are unremarkable. Chest AP portable, 3:48 a.m.: The lungs are emphysematous with large bullous disease in the right-greater-than-left upper lobes. The lungs are clear of focal infiltrates. No pleural effusion is seen. Heart size and vasculature are normal apart from aortic tortuosity with calcification in the arch. There is no substantial pleural effusion.  No pneumothorax is seen. There is osteopenia and mild thoracic dextroscoliosis. Redemonstrated distal right clavicle fracture. IMPRESSION: 1. Acute oblique fracture of the distal right clavicle shaft with 1/2 of the shaft width superior displacement of the distal fragment and tiny comminution fragments at the inferior fracture margin. No dislocation. 2. No evidence of acute chest process. Emphysema with large upper lobe bullous disease. Electronically Signed   By: Almira Bar M.D.   On: 05/19/2023 04:31    Procedures Procedures    Medications Ordered in ED Medications  HYDROcodone-acetaminophen (NORCO/VICODIN) 5-325 MG per tablet 1 tablet (1 tablet Oral Given 05/19/23 0402)    ED Course/ Medical Decision Making/ A&P                             Medical Decision Making Amount and/or Complexity of Data Reviewed Labs: ordered. Radiology: ordered.  Risk Prescription drug management.   This patient presents to the ED for concern of right shoulder and chest pain, this involves an extensive number of treatment options, and is a complaint that carries with it a high risk of complications and morbidity.  I considered the following differential and admission for this acute, potentially life threatening condition.  The differential diagnosis includes acute  traumatic injury such as sprain or fracture, atypical ACS, pneumothorax, pneumonia  MDM:    This is a 62 year old female who presents with right shoulder and chest pain.  She is overall nontoxic.  She is chronically ill-appearing.  She has reproducible tenderness on exam although her mechanism of injury seems mild.  She does not endorse hitting her shoulder or using her right arm to catch herself.  Screening EKG shows no  evidence of acute ischemia or arrhythmia.  Troponin x 1 negative.  Chest x-ray and right shoulder films are indicative of a right clavicle fracture.  She is neurovascularly intact.  She was placed in a sling.  I further questioned the patient and she still is unsure how this may have happened.  Regardless, she can follow-up with orthopedics.  (Labs, imaging, consults)  Labs: I Ordered, and personally interpreted labs.  The pertinent results include: CBC, BMP, troponin  Imaging Studies ordered: I ordered imaging studies including chest and shoulder x-rays I independently visualized and interpreted imaging. I agree with the radiologist interpretation  Additional history obtained from chart review.  External records from outside source obtained and reviewed including prior evaluations  Cardiac Monitoring: The patient was maintained on a cardiac monitor.  If on the cardiac monitor, I personally viewed and interpreted the cardiac monitored which showed an underlying rhythm of: Sinus rhythm  Reevaluation: After the interventions noted above, I reevaluated the patient and found that they have :improved  Social Determinants of Health:  lives independently  Disposition: Discharge  Co morbidities that complicate the patient evaluation  Past Medical History:  Diagnosis Date   Anemia    Anxiety    Arthritis    OSTEOARTHRITIS   Blood dyscrasia    EASY BLEEDER   Colon polyps    COPD (chronic obstructive pulmonary disease) (HCC)    Depression    Osteoporosis    Seasonal  allergies    Sickle cell trait (HCC)    Uterine fibroid      Medicines Meds ordered this encounter  Medications   HYDROcodone-acetaminophen (NORCO/VICODIN) 5-325 MG per tablet 1 tablet   oxyCODONE-acetaminophen (PERCOCET/ROXICET) 5-325 MG tablet    Sig: Take 1 tablet by mouth every 6 (six) hours as needed for severe pain.    Dispense:  10 tablet    Refill:  0    I have reviewed the patients home medicines and have made adjustments as needed  Problem List / ED Course: Problem List Items Addressed This Visit   None Visit Diagnoses     Closed displaced fracture of shaft of right clavicle, initial encounter    -  Primary                   Final Clinical Impression(s) / ED Diagnoses Final diagnoses:  Closed displaced fracture of shaft of right clavicle, initial encounter    Rx / DC Orders ED Discharge Orders          Ordered    oxyCODONE-acetaminophen (PERCOCET/ROXICET) 5-325 MG tablet  Every 6 hours PRN        05/19/23 0619              Shon Baton, MD 05/19/23 [phone]

## 2023-05-19 NOTE — ED Triage Notes (Signed)
The pt is here with rt shoulder pain  she was walking and she tripped but did not fall but since then she has had pain in the shoulder  she has not been asleep

## 2023-05-19 NOTE — Discharge Instructions (Addendum)
You were seen today for shoulder and chest pain.  You have a clavicle fracture on x-ray.  You need to follow-up with orthopedics.  You will be given a short course of pain medication.  Do not drive or operate heavy machinery while taking pain medication.

## 2023-05-19 NOTE — Progress Notes (Signed)
Orthopedic Tech Progress Note Patient Details:  [name] [name] 11-16-1961 [id number]  Ortho Devices Type of Ortho Device: Sling immobilizer Ortho Device/Splint Location: rue Ortho Device/Splint Interventions: Ordered, Adjustment, Application   Post Interventions Patient Tolerated: Well Instructions Provided: Care of device, Adjustment of device  [name] Post 05/19/2023, 5:53 AM

## 2023-05-23 ENCOUNTER — Ambulatory Visit (INDEPENDENT_AMBULATORY_CARE_PROVIDER_SITE_OTHER): Payer: 59 | Admitting: Orthopaedic Surgery

## 2023-05-23 DIAGNOSIS — S42031A Displaced fracture of lateral end of right clavicle, initial encounter for closed fracture: Secondary | ICD-10-CM

## 2023-05-23 MED ORDER — HYDROCODONE-ACETAMINOPHEN 5-325 MG PO TABS: 1.0000 | ORAL_TABLET | Freq: Four times a day (QID) | ORAL | 0 refills | Status: DC | PRN

## 2023-05-23 NOTE — Progress Notes (Signed)
Office Visit Note   Patient: [name] [name]           Date of Birth: Mar 03, 1961           MRN: [id number] Visit Date: 05/23/2023              Requested by: Ralene Ok, MD [address] [place],  [place] [address] PCP: Ralene Ok, MD   Assessment & Plan: Visit Diagnoses:  1. Closed displaced fracture of acromial end of right clavicle, initial encounter     Plan: Impression 62 year old female with mildly displaced right clavicle fracture.  This can be treated nonoperatively.  Will place her in a sling.  Recheck in 3 weeks with two-view x-rays of the right clavicle.  Follow-Up Instructions: Return in about 3 weeks (around 06/13/2023).   Orders:  No orders of the defined types were placed in this encounter.  No orders of the defined types were placed in this encounter.     Procedures: No procedures performed   Clinical Data: No additional findings.   Subjective: Chief Complaint  Patient presents with   Right Shoulder - Fracture    Clavicle fracture     HPI Patient is a 62 year old female follow-up from the ER for right clavicle fracture status post ground-level fall on 05/19/2023.  Pain is occasional.  Takes oxycodone for severe pain. Review of Systems  Constitutional: Negative.   HENT: Negative.    Eyes: Negative.   Respiratory: Negative.    Cardiovascular: Negative.   Endocrine: Negative.   Musculoskeletal: Negative.   Neurological: Negative.   Hematological: Negative.   Psychiatric/Behavioral: Negative.    All other systems reviewed and are negative.    Objective: Vital Signs: There were no vitals taken for this visit.  Physical Exam Vitals and nursing note reviewed.  Constitutional:      Appearance: She is well-developed.  HENT:     Head: Atraumatic.     Nose: Nose normal.  Eyes:     Extraocular Movements: Extraocular movements intact.  Cardiovascular:     Pulses: Normal pulses.  Pulmonary:     Effort: Pulmonary effort is normal.  Abdominal:      Palpations: Abdomen is soft.  Musculoskeletal:     Cervical back: Neck supple.  Skin:    General: Skin is warm.     Capillary Refill: Capillary refill takes less than 2 seconds.  Neurological:     Mental Status: She is alert. Mental status is at baseline.  Psychiatric:        Behavior: Behavior normal.        Thought Content: Thought content normal.        Judgment: Judgment normal.     Ortho Exam Examination right shoulder girdle shows expected bruising and swelling associated with a clavicle fracture without any neurovascular compromise. Specialty Comments:  No specialty comments available.  Imaging: No results found.   PMFS History: Patient Active Problem List   Diagnosis Date Noted   GAD (generalized anxiety disorder) 03/12/2023   Respiratory acidosis 08/11/2021   CAP (community acquired pneumonia) 08/11/2021   Acute hypercapnic respiratory failure (HCC) 08/10/2021   COVID-19 10/25/2020   COPD  GOLD 3  06/11/2018   Protein calorie malnutrition (HCC) 05/21/2017   Chronic respiratory failure with hypoxia (HCC) 05/21/2017   Abnormal CT scan 12/11/2016   Cigarette smoker 02/06/2016   COPD with emphysema (HCC) 02/06/2016   OBSTRUCTIVE CHRONIC BRONCHITIS WITHOUT EXACERBAT 12/01/2008   EMPHYSEMA, BULLOUS 12/01/2008   MDD (major depressive disorder)  04/05/2008   RESTRICTIVE LUNG DISEASE 04/05/2008   GERD 04/05/2008   DIVERTICULOSIS, COLON 04/05/2008   IRRITABLE BOWEL SYNDROME 04/05/2008   HEMORRHOIDS, HX OF 04/05/2008   Past Medical History:  Diagnosis Date   Anemia    Anxiety    Arthritis    OSTEOARTHRITIS   Blood dyscrasia    EASY BLEEDER   Colon polyps    COPD (chronic obstructive pulmonary disease) (HCC)    Depression    Osteoporosis    Seasonal allergies    Sickle cell trait (HCC)    Uterine fibroid     Family History  Problem Relation Age of Onset   Stomach cancer Father 55   Throat cancer Brother    Heart disease Mother    Breast cancer Neg  Hx     Past Surgical History:  Procedure Laterality Date   CESAREAN SECTION  1980   DILATATION & CURETTAGE/HYSTEROSCOPY WITH MYOSURE N/A 09/30/2017   Procedure: DILATATION & CURETTAGE/HYSTEROSCOPY WITH MYOSURE MYOMECTOMY;  Surgeon: Geryl Rankins, MD;  Location: [facility];  Service: Gynecology;  Laterality: N/A;  PMB   Social History   Occupational History   Not on file  Tobacco Use   Smoking status: Some Days    Packs/day: 0.50    Years: 42.00    Additional pack years: 0.00    Total pack years: 21.00    Types: Cigarettes   Smokeless tobacco: Never  Vaping Use   Vaping Use: Never used  Substance and Sexual Activity   Alcohol use: Yes    Alcohol/week: 0.0 standard drinks of alcohol    Comment: occasional   Drug use: No   Sexual activity: Not on file

## 2023-05-30 ENCOUNTER — Telehealth: Payer: Self-pay | Admitting: Orthopaedic Surgery

## 2023-05-30 NOTE — Telephone Encounter (Signed)
Shouldn't be related to the clavicle fracture.  She should see a medical doctor.

## 2023-05-30 NOTE — Telephone Encounter (Signed)
Given hydrocodone for pain. Chills just started this am when she woke up. 99.5 is her temp.  She stated she isn't taking anything else in between her pain pills. I advised to her that she can take otc medication to help with the pain.

## 2023-05-30 NOTE — Telephone Encounter (Signed)
Pt called requesting a call back from Margrett Rud PA Mardella Layman or Dr Roda Shutters. Pt states pain medication is not working and she also is having chills, and running nose. She need medical advice what to do about her pain. Please call pt at [phone]

## 2023-06-09 ENCOUNTER — Other Ambulatory Visit (HOSPITAL_COMMUNITY): Payer: Self-pay | Admitting: Psychiatry

## 2023-06-09 DIAGNOSIS — F411 Generalized anxiety disorder: Secondary | ICD-10-CM

## 2023-06-09 DIAGNOSIS — F331 Major depressive disorder, recurrent, moderate: Secondary | ICD-10-CM

## 2023-06-12 ENCOUNTER — Encounter: Payer: Self-pay | Admitting: Orthopaedic Surgery

## 2023-06-12 ENCOUNTER — Ambulatory Visit (INDEPENDENT_AMBULATORY_CARE_PROVIDER_SITE_OTHER): Payer: 59 | Admitting: Orthopaedic Surgery

## 2023-06-12 ENCOUNTER — Other Ambulatory Visit (INDEPENDENT_AMBULATORY_CARE_PROVIDER_SITE_OTHER): Payer: 59

## 2023-06-12 DIAGNOSIS — S42031A Displaced fracture of lateral end of right clavicle, initial encounter for closed fracture: Secondary | ICD-10-CM | POA: Diagnosis not present

## 2023-06-12 NOTE — Progress Notes (Signed)
Office Visit Note   Patient: [name] [name]           Date of Birth: 02/27/1961           MRN: [id number] Visit Date: 06/12/2023              Requested by: Ralene Ok, MD [address] [place],  [place] [address] PCP: Ralene Ok, MD   Assessment & Plan: Visit Diagnoses:  1. Closed displaced fracture of acromial end of right clavicle, initial encounter     Plan: Patient is 3 weeks status post right minimally displaced clavicle fracture.  Clinically she is doing very well.  She is able to tolerate quite a bit of range of motion.  At this point we will give her a work note with restrictions for 3 weeks.  She can start to wean the sling while at home.  Begin gentle range of motion.  Recheck in 3 weeks with two-view x-rays of the right clavicle.  Follow-Up Instructions: Return in about 3 weeks (around 07/03/2023).   Orders:  Orders Placed This Encounter  Procedures   XR Clavicle Right   No orders of the defined types were placed in this encounter.     Procedures: No procedures performed   Clinical Data: No additional findings.   Subjective: Chief Complaint  Patient presents with   Right Shoulder - Follow-up    Clavicle fracture    HPI Patient follows up today 3 weeks status post right clavicle fracture.  She is doing much better overall. Review of Systems  Constitutional: Negative.   HENT: Negative.    Eyes: Negative.   Respiratory: Negative.    Cardiovascular: Negative.   Endocrine: Negative.   Musculoskeletal: Negative.   Neurological: Negative.   Hematological: Negative.   Psychiatric/Behavioral: Negative.    All other systems reviewed and are negative.    Objective: Vital Signs: There were no vitals taken for this visit.  Physical Exam Vitals and nursing note reviewed.  Constitutional:      Appearance: She is well-developed.  HENT:     Head: Atraumatic.     Nose: Nose normal.  Eyes:     Extraocular Movements: Extraocular movements intact.   Cardiovascular:     Pulses: Normal pulses.  Pulmonary:     Effort: Pulmonary effort is normal.  Abdominal:     Palpations: Abdomen is soft.  Musculoskeletal:     Cervical back: Neck supple.  Skin:    General: Skin is warm.     Capillary Refill: Capillary refill takes less than 2 seconds.  Neurological:     Mental Status: She is alert. Mental status is at baseline.  Psychiatric:        Behavior: Behavior normal.        Thought Content: Thought content normal.        Judgment: Judgment normal.     Ortho Exam Right shoulder girdle shows no significant tenderness to the fracture site.  There is no movement through the fracture.  She is able to tolerate gentle range of motion of the shoulder quite well. Specialty Comments:  No specialty comments available.  Imaging: XR Clavicle Right  Result Date: 06/12/2023 Clavicle x-rays show that there is consolidation and continued fracture healing.    PMFS History: Patient Active Problem List   Diagnosis Date Noted   GAD (generalized anxiety disorder) 03/12/2023   Respiratory acidosis 08/11/2021   CAP (community acquired pneumonia) 08/11/2021   Acute hypercapnic respiratory failure (HCC) 08/10/2021  COVID-19 10/25/2020   COPD  GOLD 3  06/11/2018   Protein calorie malnutrition (HCC) 05/21/2017   Chronic respiratory failure with hypoxia (HCC) 05/21/2017   Abnormal CT scan 12/11/2016   Cigarette smoker 02/06/2016   COPD with emphysema (HCC) 02/06/2016   OBSTRUCTIVE CHRONIC BRONCHITIS WITHOUT EXACERBAT 12/01/2008   EMPHYSEMA, BULLOUS 12/01/2008   MDD (major depressive disorder) 04/05/2008   RESTRICTIVE LUNG DISEASE 04/05/2008   GERD 04/05/2008   DIVERTICULOSIS, COLON 04/05/2008   IRRITABLE BOWEL SYNDROME 04/05/2008   HEMORRHOIDS, HX OF 04/05/2008   Past Medical History:  Diagnosis Date   Anemia    Anxiety    Arthritis    OSTEOARTHRITIS   Blood dyscrasia    EASY BLEEDER   Colon polyps    COPD (chronic obstructive  pulmonary disease) (HCC)    Depression    Osteoporosis    Seasonal allergies    Sickle cell trait (HCC)    Uterine fibroid     Family History  Problem Relation Age of Onset   Stomach cancer Father 25   Throat cancer Brother    Heart disease Mother    Breast cancer Neg Hx     Past Surgical History:  Procedure Laterality Date   CESAREAN SECTION  1980   DILATATION & CURETTAGE/HYSTEROSCOPY WITH MYOSURE N/A 09/30/2017   Procedure: DILATATION & CURETTAGE/HYSTEROSCOPY WITH MYOSURE MYOMECTOMY;  Surgeon: Geryl Rankins, MD;  Location: [facility];  Service: Gynecology;  Laterality: N/A;  PMB   Social History   Occupational History   Not on file  Tobacco Use   Smoking status: Some Days    Packs/day: 0.50    Years: 42.00    Additional pack years: 0.00    Total pack years: 21.00    Types: Cigarettes   Smokeless tobacco: Never  Vaping Use   Vaping Use: Never used  Substance and Sexual Activity   Alcohol use: Yes    Alcohol/week: 0.0 standard drinks of alcohol    Comment: occasional   Drug use: No   Sexual activity: Not on file

## 2023-06-24 ENCOUNTER — Telehealth (HOSPITAL_BASED_OUTPATIENT_CLINIC_OR_DEPARTMENT_OTHER): Payer: 59 | Admitting: Psychiatry

## 2023-06-24 ENCOUNTER — Other Ambulatory Visit: Payer: Self-pay | Admitting: Internal Medicine

## 2023-06-24 ENCOUNTER — Encounter (HOSPITAL_COMMUNITY): Payer: Self-pay | Admitting: Psychiatry

## 2023-06-24 DIAGNOSIS — Z1231 Encounter for screening mammogram for malignant neoplasm of breast: Secondary | ICD-10-CM

## 2023-06-24 DIAGNOSIS — R634 Abnormal weight loss: Secondary | ICD-10-CM

## 2023-06-24 DIAGNOSIS — R945 Abnormal results of liver function studies: Secondary | ICD-10-CM

## 2023-06-24 DIAGNOSIS — F411 Generalized anxiety disorder: Secondary | ICD-10-CM

## 2023-06-24 DIAGNOSIS — F331 Major depressive disorder, recurrent, moderate: Secondary | ICD-10-CM

## 2023-06-24 MED ORDER — FLUOXETINE HCL 10 MG PO CAPS: ORAL_CAPSULE | ORAL | 2 refills | Status: DC

## 2023-06-24 MED ORDER — TRAZODONE HCL 50 MG PO TABS
25.0000 mg | ORAL_TABLET | Freq: Every day | ORAL | 1 refills | Status: DC
Start: 2023-06-24 — End: 2023-08-26

## 2023-06-24 MED ORDER — BUPROPION HCL ER (XL) 150 MG PO TB24
ORAL_TABLET | ORAL | 2 refills | Status: DC
Start: 2023-06-24 — End: 2023-07-29

## 2023-06-24 NOTE — Progress Notes (Signed)
BH MD/PA/NP OP Progress Note  06/24/2023 11:24 AM [name] [name]  MRN:  [id number]  Visit Diagnosis:    ICD-10-CM   1. GAD (generalized anxiety disorder)  F41.1 buPROPion (WELLBUTRIN XL) 150 MG 24 hr tablet    FLUoxetine (PROZAC) 10 MG capsule    traZODone (DESYREL) 50 MG tablet    2. Moderate episode of recurrent major depressive disorder (HCC)  F33.1 buPROPion (WELLBUTRIN XL) 150 MG 24 hr tablet    FLUoxetine (PROZAC) 10 MG capsule    traZODone (DESYREL) 50 MG tablet      Assessment: [name] [name] is a 62 y.o. female with a history of MDD, COPD who presented in person to [facility] at [facility] for initial evaluation on 03/12/2023.    At initial evaluation patient reported symptoms of low mood, anhedonia, amotivation, poor sleep, weight loss, some negative self thoughts, and passive SI.  She denied any intent or plan to act on it.  [name] [name] also endorsed significant symptoms of anxiety including constant worry that she is unable to control, fear of something awful happening, restlessness, difficulty relaxing, and racing thoughts.  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  Patient met criteria for MDD and GAD.  [name] [name] presents for follow-up evaluation. Today, 06/24/23, patient reports mild increase in depression in the interim secondary to increased isolation after fracturing her collarbone.  That has improved and she is going to return to work today which will improve the limited social interactions.  Patient denies any adverse side effects other than sedation from the trazodone which is improved by decreasing dose to 25 mg at bedtime.  She does have some concern about weight loss would recommend continuing metabolic workup before possibly discontinuing Wellbutrin to see if that is impacting symptoms.  For now we  will continue on her current regimen and follow-up in 2 months.  Plan: - Continue Wellbutrin Xl 150 mg QD - Continue Prozac 10 mg QD - Decrease Trazodone 25 mg QHS - Can consider therapy referral in the future - Crisis resources reviewed - Follow up in 2 months   Chief Complaint:  Chief Complaint  Patient presents with   Follow-up   HPI:  [name] [name] reports that things have seem have been ok. A bit more depressed as she fell and cracked her collar bone last month and has been stuck at home. She is starting to get a bit better in regards to mood now as she has improved physically.  [name] [name] is going back to the first time today which she is a bit nervous about.  When she gets reacclimate at work she believes the anxiety symptoms will improve.  She does still breast concern about her weight loss.  Patient notes that she is now 118 pounds.  She is following up with her PCP and getting a liver scan for this.  If no identifiable causes found can consider discontinuing Wellbutrin to see if that is impacting weight symptoms.  Patient denied any particular impact on her diet while on the medication.  Outside of this she continues to take half of trazodone as the full tab is over sedating.  She denies any other concerns about any of her medications.  Past Psychiatric History: Patient had connected with outpatient psychiatrist in the late 1990s named Dr. Laureen Ochs at the [facility].  She followed with him  for about 5 years before discontinuing.  She believes she was prescribed trazodone at that time and discontinued it after finishing with him.  Patient's PCP had tried bupropion standard release twice daily.  Currently on Wellbutrin XL, Prozac, and trazodone  Patient has significant history of past substance use primarily cocaine and marijuana occurred around 20 to 30 years ago.  She has discontinued that since.  Currently she endorses alcohol use around 1-2 times a week and nicotine use.  She denies any  other substance use.   Past Medical History:  Past Medical History:  Diagnosis Date   Anemia    Anxiety    Arthritis    OSTEOARTHRITIS   Blood dyscrasia    EASY BLEEDER   Colon polyps    COPD (chronic obstructive pulmonary disease) (HCC)    Depression    Osteoporosis    Seasonal allergies    Sickle cell trait (HCC)    Uterine fibroid     Past Surgical History:  Procedure Laterality Date   CESAREAN SECTION  1980   DILATATION & CURETTAGE/HYSTEROSCOPY WITH MYOSURE N/A 09/30/2017   Procedure: DILATATION & CURETTAGE/HYSTEROSCOPY WITH MYOSURE MYOMECTOMY;  Surgeon: Geryl Rankins, MD;  Location: [facility];  Service: Gynecology;  Laterality: N/A;  PMB    Family History:  Family History  Problem Relation Age of Onset   Stomach cancer Father 89   Throat cancer Brother    Heart disease Mother    Breast cancer Neg Hx     Social History:  Social History   Socioeconomic History   Marital status: Single    Spouse name: Not on file   Number of children: 1   Years of education: Not on file   Highest education level: Not on file  Occupational History   Not on file  Tobacco Use   Smoking status: Some Days    Packs/day: 0.50    Years: 42.00    Additional pack years: 0.00    Total pack years: 21.00    Types: Cigarettes   Smokeless tobacco: Never  Vaping Use   Vaping Use: Never used  Substance and Sexual Activity   Alcohol use: Yes    Alcohol/week: 0.0 standard drinks of alcohol    Comment: occasional   Drug use: No   Sexual activity: Not on file  Other Topics Concern   Not on file  Social History Narrative   ** Merged History Encounter **       Social Determinants of Health   Financial Resource Strain: Not on file  Food Insecurity: Not on file  Transportation Needs: Not on file  Physical Activity: Not on file  Stress: Not on file  Social Connections: Not on file    Allergies:  Allergies  Allergen Reactions   Clobetasol Emul Foam W-Moistcr     Sore throat,  trouble breathing, swallowing,feeling sleepy, more hungry, passing urine more often,and weight loss    Current Medications: Current Outpatient Medications  Medication Sig Dispense Refill   acetaminophen (TYLENOL) 325 MG tablet Take 325-650 mg by mouth every 6 (six) hours as needed (pain).     albuterol (PROVENTIL) (2.5 MG/3ML) 0.083% nebulizer solution Take 3 mLs (2.5 mg total) by nebulization every 6 (six) hours as needed for wheezing or shortness of breath. 360 mL 12   albuterol (VENTOLIN HFA) 108 (90 Base) MCG/ACT inhaler Inhale 2 puffs into the lungs every 6 (six) hours as needed for wheezing or shortness of breath. 1 g 3   amLODipine (NORVASC) 5  MG tablet Take 5 mg by mouth every morning.     buPROPion (WELLBUTRIN XL) 150 MG 24 hr tablet TAKE 1 TABLET(150 MG) BY MOUTH EVERY MORNING 30 tablet 2   colchicine 0.6 MG tablet Take 1 tablet (0.6 mg total) by mouth daily. Take 2 tablets today as soon as you pick up the prescription.  And then 1 more tablet tonight before bed.  If your pain is persisting when you return tomorrow morning continue to take 1 tablet in the morning and 1 at night until your pain resolves. (Patient taking differently: Take 0.6 mg by mouth daily. Take 2 tablets today as soon as you pick up the prescription.  And then 1 more tablet tonight before bed.  If your pain is persisting when you return tomorrow morning continue to take 1 tablet in the morning and 1 at night until your pain resolves.) 14 tablet 0   denosumab (PROLIA) 60 MG/ML SOSY injection Inject 60 mg into the skin every 6 (six) months.     FLUoxetine (PROZAC) 10 MG capsule TAKE 1 CAPSULE(10 MG) BY MOUTH DAILY 30 capsule 2   HYDROcodone-acetaminophen (NORCO) 5-325 MG tablet Take 1 tablet by mouth every 6 (six) hours as needed. 20 tablet 0   hydroxychloroquine (PLAQUENIL) 200 MG tablet Take 1 tablet (200 mg total) by mouth 2 (two) times daily. 60 tablet 1   Multiple Vitamin (MULTIVITAMIN WITH MINERALS) TABS tablet Take  1 tablet by mouth every morning.     oxyCODONE-acetaminophen (PERCOCET/ROXICET) 5-325 MG tablet Take 1 tablet by mouth every 6 (six) hours as needed for severe pain. 10 tablet 0   OXYGEN Inhale 5 L into the lungs continuous.     Tiotropium Bromide-Olodaterol (STIOLTO RESPIMAT) 2.5-2.5 MCG/ACT AERS Inhale 2 puffs into the lungs daily. 4 g 12   traZODone (DESYREL) 50 MG tablet Take 0.5 tablets (25 mg total) by mouth at bedtime. 30 tablet 1   No current facility-administered medications for this visit.     Psychiatric Specialty Exam: Review of Systems  There were no vitals taken for this visit.There is no height or weight on file to calculate BMI.  General Appearance: Fairly Groomed  Eye Contact:  Fair  Speech:  Clear and Coherent  Volume:  Normal  Mood:  Anxious and Euthymic  Affect:  Congruent  Thought Process:  Coherent  Orientation:  Full (Time, Place, and Person)  Thought Content: Logical   Suicidal Thoughts:  No  Homicidal Thoughts:  No  Memory:  Immediate;   Good  Judgement:  Good  Insight:  Fair  Psychomotor Activity:  Normal  Concentration:  Concentration: Good  Recall:  Good  Fund of Knowledge: Fair  Language: Good  Akathisia:  NA    AIMS (if indicated): not done  Assets:  Communication Skills Desire for Improvement Housing Transportation  ADL's:  Intact  Cognition: WNL  Sleep:  Good   Metabolic Disorder Labs: No results found for: "HGBA1C", "MPG" No results found for: "PROLACTIN" No results found for: "CHOL", "TRIG", "HDL", "CHOLHDL", "VLDL", "LDLCALC" No results found for: "TSH"  Therapeutic Level Labs: No results found for: "LITHIUM" No results found for: "VALPROATE" No results found for: "CBMZ"   Screenings: GAD-7    Flowsheet Row [facility]  Total GAD-7 Score 12      PHQ2-9    Flowsheet Row [facility] Visit [facility]12/2023 [facility]  PHQ-2 Total Score 2  PHQ-9 Total Score  10      Flowsheet Row ED from 05/19/2023 in [facility] Emergency Department at [facility] Office Visit from [facility]/12/2023 in [facility] from [facility]/2022 in [facility] at [facility]  C-SSRS RISK CATEGORY No Risk Low Risk No Risk       Collaboration of Care: Collaboration of Care: Medication Management AEB medication prescription and Other provider involved in patient's care AEB ED and orthopedic chart review  Patient/Guardian was advised Release of Information must be obtained prior to any record release in order to collaborate their care with an outside provider. Patient/Guardian was advised if they have not already done so to contact the registration department to sign all necessary forms in order for [place] to release information regarding their care.   Consent: Patient/Guardian gives [name] [name] and assignment of benefits for services provided during this visit. Patient/Guardian expressed understanding and agreed to proceed.    Stasia Cavalier, MD 06/24/2023, 11:24 AM   Virtual Visit via Video Note  I connected with Benard Rink on 06/24/23 at 11:00 AM EDT by a video enabled telemedicine application and verified that I am speaking with the correct person using two identifiers.  Location: Patient: Home Provider: Home Office   I discussed the limitations of evaluation and management by telemedicine and the availability of in person appointments. The patient expressed understanding and agreed to proceed.   I discussed the assessment and [name] plan with the patient. The patient was provided an opportunity to ask questions and all were answered. The patient agreed with the plan and demonstrated an understanding of the instructions.   The patient was advised to call back or seek an in-person evaluation if the symptoms worsen or if the  condition fails to improve as anticipated.  I provided 10 minutes of non-face-to-face time during this encounter.   Stasia Cavalier, MD

## 2023-07-03 ENCOUNTER — Encounter: Payer: Self-pay | Admitting: Orthopaedic Surgery

## 2023-07-03 ENCOUNTER — Ambulatory Visit: Payer: 59 | Admitting: Orthopaedic Surgery

## 2023-07-03 ENCOUNTER — Other Ambulatory Visit (INDEPENDENT_AMBULATORY_CARE_PROVIDER_SITE_OTHER): Payer: 59

## 2023-07-03 DIAGNOSIS — S42031A Displaced fracture of lateral end of right clavicle, initial encounter for closed fracture: Secondary | ICD-10-CM | POA: Diagnosis not present

## 2023-07-03 NOTE — Progress Notes (Signed)
Office Visit Note   Patient: [name] [name]           Date of Birth: 11-08-1961           MRN: [id number] Visit Date: 07/03/2023              Requested by: [name] Ok, MD [address] [place],  [place] [address] PCP: [name] Ok, MD   Assessment & Plan: Visit Diagnoses:  1. Closed displaced fracture of acromial end of right clavicle, initial encounter     Plan: [name] [name] is now 6 weeks status post nondisplaced right clavicle fracture.  Clinically she has improved significantly.  Radiographically she has abundant callus formation.  I do not feel that she needs physical therapy and she agrees that she can just rehab this on her own.  From my standpoint we can just see her back as needed.  She can increase activity as tolerated.  Follow-Up Instructions: No follow-ups on file.   Orders:  Orders Placed This Encounter  Procedures   XR Clavicle Right   No orders of the defined types were placed in this encounter.     Procedures: No procedures performed   Clinical Data: No additional findings.   Subjective: Chief Complaint  Patient presents with   Right Shoulder - Follow-up    Clavicle fracture    HPI [name] [name] follows up today for right clavicle fracture.  She is feeling better overall. Review of Systems   Objective: Vital Signs: There were no vitals taken for this visit.  Physical Exam  Ortho Exam Examination of the right shoulder girdle shows mild persistent swelling over the fracture site.  Function of the shoulder and the arm are remarkably better.  She has almost full active range of motion Specialty Comments:  No specialty comments available.  Imaging: No results found.   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   OBSTRUCTIVE CHRONIC BRONCHITIS WITHOUT EXACERBAT 12/01/2008   EMPHYSEMA, BULLOUS 12/01/2008   MDD (major depressive disorder) 04/05/2008   RESTRICTIVE LUNG DISEASE 04/05/2008   GERD 04/05/2008   DIVERTICULOSIS, COLON 04/05/2008   IRRITABLE BOWEL SYNDROME 04/05/2008   HEMORRHOIDS, HX OF 04/05/2008   Past Medical History:  Diagnosis Date   Anemia    Anxiety    Arthritis    OSTEOARTHRITIS   Blood dyscrasia    EASY BLEEDER   Colon polyps    COPD (chronic obstructive pulmonary disease) (HCC)    Depression    Osteoporosis    Seasonal allergies    Sickle cell trait (HCC)    Uterine fibroid     Family History  Problem Relation Age of Onset   Stomach cancer Father 11   Throat cancer Brother    Heart disease Mother    Breast cancer Neg Hx     Past Surgical History:  Procedure Laterality Date   CESAREAN SECTION  1980   DILATATION & CURETTAGE/HYSTEROSCOPY WITH MYOSURE N/A 09/30/2017   Procedure: DILATATION & CURETTAGE/HYSTEROSCOPY WITH MYOSURE MYOMECTOMY;  Surgeon: Geryl Rankins, MD;  Location: [facility];  Service:  Gynecology;  Laterality: N/A;  PMB   Social History   Occupational History   Not on file  Tobacco Use   Smoking status: Some Days    Current packs/day: 0.50    Average packs/day: 0.5 packs/day for 42.0 years (21.0 ttl pk-yrs)    Types: Cigarettes   Smokeless tobacco: Never  Vaping Use   Vaping status: Never Used  Substance and Sexual Activity   Alcohol use: Yes    Alcohol/week: 0.0 standard drinks of alcohol    Comment: occasional   Drug use: No   Sexual activity: Not on file

## 2023-07-11 ENCOUNTER — Ambulatory Visit
Admission: RE | Admit: 2023-07-11 | Discharge: 2023-07-11 | Disposition: A | Discharge status: Home / Self Care | Payer: 59 | Source: Ambulatory Visit | Attending: Internal Medicine | Admitting: Internal Medicine

## 2023-07-11 DIAGNOSIS — R634 Abnormal weight loss: Secondary | ICD-10-CM

## 2023-07-11 DIAGNOSIS — R945 Abnormal results of liver function studies: Secondary | ICD-10-CM

## 2023-07-29 ENCOUNTER — Other Ambulatory Visit (HOSPITAL_COMMUNITY): Payer: Self-pay | Admitting: Psychiatry

## 2023-07-29 ENCOUNTER — Other Ambulatory Visit: Payer: Self-pay | Admitting: Internal Medicine

## 2023-07-29 DIAGNOSIS — F411 Generalized anxiety disorder: Secondary | ICD-10-CM

## 2023-07-29 DIAGNOSIS — F331 Major depressive disorder, recurrent, moderate: Secondary | ICD-10-CM

## 2023-08-14 ENCOUNTER — Other Ambulatory Visit (HOSPITAL_COMMUNITY): Payer: Self-pay | Admitting: Psychiatry

## 2023-08-14 DIAGNOSIS — F331 Major depressive disorder, recurrent, moderate: Secondary | ICD-10-CM

## 2023-08-14 DIAGNOSIS — F411 Generalized anxiety disorder: Secondary | ICD-10-CM

## 2023-08-21 ENCOUNTER — Other Ambulatory Visit: Payer: Self-pay | Admitting: Gastroenterology

## 2023-08-25 NOTE — Progress Notes (Unsigned)
BH MD/PA/NP OP Progress Note  08/25/2023 8:33 AM [name] [name]  MRN:  [id number]  Visit Diagnosis:  No diagnosis found.   Assessment: [name] [name] is a 62 y.o. female with a history of MDD, COPD who presented in person to [facility] for initial evaluation on 03/12/2023.    At initial evaluation patient reported symptoms of low mood, anhedonia, amotivation, poor sleep, weight loss, some negative self thoughts, and passive SI.  She denied any intent or plan to act on it.  Lorrene also endorsed significant symptoms of anxiety including constant worry that she is unable to control, fear of something awful happening, restlessness, difficulty relaxing, and racing thoughts.  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  Patient met criteria for MDD and GAD.  [name] [name] presents for follow-up evaluation. Today, 08/25/23, patient reports   mild increase in depression in the interim secondary to increased isolation after fracturing her collarbone.  That has improved and she is going to return to work today which will improve the limited social interactions.  Patient denies any adverse side effects other than sedation from the trazodone which is improved by decreasing dose to 25 mg at bedtime.  She does have some concern about weight loss would recommend continuing metabolic workup before possibly discontinuing Wellbutrin to see if that is impacting symptoms.  For now we will continue on her current regimen and follow-up in 2 months.   Plan: - Continue Wellbutrin Xl 150 mg QD - Continue Prozac 10 mg QD - Continue Trazodone 25 mg QHS - Can consider therapy referral in the future - Crisis resources reviewed - Follow up in 2 months   Chief Complaint:  No chief complaint on file.  HPI: [name] [name] presents for reporting  that  hings have seem have been ok. A bit more depressed as she fell and cracked her collar bone last month and has been stuck at home. She is starting to get a bit better in regards to mood now as she has improved physically.  [name] [name] is going back to the first time today which she is a bit nervous about.  When she gets reacclimate at work she believes the anxiety symptoms will improve.  She does still breast concern about her weight loss.  Patient notes that she is now 118 pounds.  She is following up with her PCP and getting a liver scan for this.  If no identifiable causes found can consider discontinuing Wellbutrin to see if that is impacting weight symptoms.  Patient denied any particular impact on her diet while on the medication.  Outside of this she continues to take half of trazodone as the full tab is over sedating.  She denies any other concerns about any of her medications.   Past Psychiatric History: Patient had connected with outpatient psychiatrist in the late 1990s named Dr. Laureen Ochs at the [facility].  She followed with him for about 5 years before discontinuing.  She believes she was prescribed trazodone at that time and discontinued it after finishing with him.  Patient's PCP had tried bupropion standard release twice daily.  Currently on Wellbutrin XL, Prozac, and trazodone  Patient has significant history of past substance use primarily cocaine and marijuana occurred around 20 to 30 years ago.  She has discontinued that since.  Currently she endorses alcohol use  around 1-2 times a week and nicotine use.  She denies any other substance use.  Past Medical History:  Past Medical History:  Diagnosis Date   Anemia    Anxiety    Arthritis    OSTEOARTHRITIS   Blood dyscrasia    EASY BLEEDER   Colon polyps    COPD (chronic obstructive pulmonary disease) (HCC)    Depression    Osteoporosis    Seasonal allergies    Sickle cell trait (HCC)    Uterine fibroid     Past  Surgical History:  Procedure Laterality Date   CESAREAN SECTION  1980   DILATATION & CURETTAGE/HYSTEROSCOPY WITH MYOSURE N/A 09/30/2017   Procedure: DILATATION & CURETTAGE/HYSTEROSCOPY WITH MYOSURE MYOMECTOMY;  Surgeon: Geryl Rankins, MD;  Location: [facility];  Service: Gynecology;  Laterality: N/A;  PMB    Family History:  Family History  Problem Relation Age of Onset   Stomach cancer Father 59   Throat cancer Brother    Heart disease Mother    Breast cancer Neg Hx     Social History:  Social History   Socioeconomic History   Marital status: Single    Spouse name: Not on file   Number of children: 1   Years of education: Not on file   Highest education level: Not on file  Occupational History   Not on file  Tobacco Use   Smoking status: Some Days    Current packs/day: 0.50    Average packs/day: 0.5 packs/day for 42.0 years (21.0 ttl pk-yrs)    Types: Cigarettes   Smokeless tobacco: Never  Vaping Use   Vaping status: Never Used  Substance and Sexual Activity   Alcohol use: Yes    Alcohol/week: 0.0 standard drinks of alcohol    Comment: occasional   Drug use: No   Sexual activity: Not on file  Other Topics Concern   Not on file  Social History Narrative   ** Merged History Encounter **       Social Determinants of Health   Financial Resource Strain: Not on file  Food Insecurity: Not on file  Transportation Needs: Not on file  Physical Activity: Not on file  Stress: Not on file  Social Connections: Not on file    Allergies:  Allergies  Allergen Reactions   Clobetasol Emul Foam W-Moistcr     Sore throat, trouble breathing, swallowing,feeling sleepy, more hungry, passing urine more often,and weight loss    Current Medications: Current Outpatient Medications  Medication Sig Dispense Refill   acetaminophen (TYLENOL) 325 MG tablet Take 325-650 mg by mouth every 6 (six) hours as needed (pain).     albuterol (PROVENTIL) (2.5 MG/3ML) 0.083% nebulizer solution  Take 3 mLs (2.5 mg total) by nebulization every 6 (six) hours as needed for wheezing or shortness of breath. 360 mL 12   albuterol (VENTOLIN HFA) 108 (90 Base) MCG/ACT inhaler INHALE 2 PUFFS INTO THE LUNGS EVERY 6 HOURS AS NEEDED FOR WHEEZING OR SHORTNESS OF BREATH 6.7 g 3   amLODipine (NORVASC) 5 MG tablet Take 5 mg by mouth every morning.     buPROPion (WELLBUTRIN XL) 150 MG 24 hr tablet TAKE 1 TABLET(150 MG) BY MOUTH EVERY MORNING 30 tablet 0   colchicine 0.6 MG tablet Take 1 tablet (0.6 mg total) by mouth daily. Take 2 tablets today as soon as you pick up the prescription.  And then 1 more tablet tonight before bed.  If your pain is persisting when you return tomorrow morning continue  to take 1 tablet in the morning and 1 at night until your pain resolves. (Patient taking differently: Take 0.6 mg by mouth daily. Take 2 tablets today as soon as you pick up the prescription.  And then 1 more tablet tonight before bed.  If your pain is persisting when you return tomorrow morning continue to take 1 tablet in the morning and 1 at night until your pain resolves.) 14 tablet 0   denosumab (PROLIA) 60 MG/ML SOSY injection Inject 60 mg into the skin every 6 (six) months.     FLUoxetine (PROZAC) 10 MG capsule TAKE 1 CAPSULE(10 MG) BY MOUTH DAILY 30 capsule 2   HYDROcodone-acetaminophen (NORCO) 5-325 MG tablet Take 1 tablet by mouth every 6 (six) hours as needed. 20 tablet 0   hydroxychloroquine (PLAQUENIL) 200 MG tablet Take 1 tablet (200 mg total) by mouth 2 (two) times daily. 60 tablet 1   Multiple Vitamin (MULTIVITAMIN WITH MINERALS) TABS tablet Take 1 tablet by mouth every morning.     oxyCODONE-acetaminophen (PERCOCET/ROXICET) 5-325 MG tablet Take 1 tablet by mouth every 6 (six) hours as needed for severe pain. 10 tablet 0   OXYGEN Inhale 5 L into the lungs continuous.     Tiotropium Bromide-Olodaterol (STIOLTO RESPIMAT) 2.5-2.5 MCG/ACT AERS Inhale 2 puffs into the lungs daily. 4 g 12   traZODone  (DESYREL) 50 MG tablet Take 0.5 tablets (25 mg total) by mouth at bedtime. 30 tablet 1   No current facility-administered medications for this visit.     Musculoskeletal: Strength & Muscle Tone: within normal limits Gait & Station: normal Patient leans: N/A  Psychiatric Specialty Exam: Review of Systems  There were no vitals taken for this visit.There is no height or weight on file to calculate BMI.  General Appearance: Fairly Groomed  Eye Contact:  Good  Speech:  Clear and Coherent and Normal Rate  Volume:  Normal  Mood:  Euthymic  Affect:  Congruent  Thought Process:  Coherent  Orientation:  Full (Time, Place, and Person)  Thought Content: Logical   Suicidal Thoughts:  No  Homicidal Thoughts:  No  Memory:  Immediate;   Good  Judgement:  Fair  Insight:  Fair  Psychomotor Activity:  Normal  Concentration:  Concentration: Good  Recall:  Good  Fund of Knowledge: Fair  Language: Good  Akathisia:  NA    AIMS (if indicated): not done  Assets:  Tax adviser  ADL's:  Intact  Cognition: WNL  Sleep:  Good   Metabolic Disorder Labs: No results found for: "HGBA1C", "MPG" No results found for: "PROLACTIN" No results found for: "CHOL", "TRIG", "HDL", "CHOLHDL", "VLDL", "LDLCALC" No results found for: "TSH"  Therapeutic Level Labs: No results found for: "LITHIUM" No results found for: "VALPROATE" No results found for: "CBMZ"   Screenings: GAD-7    Flowsheet Row [facility]  Total GAD-7 Score 12      PHQ2-9    Flowsheet Row Office Visit from 03/12/2023 in [facility]  PHQ-2 Total Score 2  PHQ-9 Total Score 10      Flowsheet Row ED from 05/19/2023 in [facility] Office Visit from [facility]20[facility] from [facility]/28/2022 in [facility] Department at [facility]  C-SSRS RISK CATEGORY No Risk Low Risk No Risk       Collaboration of Care: Collaboration of  Care: Medication Management AEB medication prescription and Other provider involved in patient's care AEB ortho chart review  Patient/Guardian was advised Release of Information must be obtained prior to any record release in order to collaborate their care with an outside provider. Patient/Guardian was advised if they have not already done so to contact the registration department to sign all necessary forms in order for [place] to release information regarding their care.   Consent: Patient/Guardian gives verbal consent for treatment and assignment of benefits for services provided during this visit. Patient/Guardian expressed understanding and agreed to proceed.    Stasia Cavalier, MD 08/25/2023, 8:33 AM

## 2023-08-26 ENCOUNTER — Ambulatory Visit (HOSPITAL_BASED_OUTPATIENT_CLINIC_OR_DEPARTMENT_OTHER): Payer: 59 | Admitting: Psychiatry

## 2023-08-26 ENCOUNTER — Other Ambulatory Visit (HOSPITAL_COMMUNITY): Payer: Self-pay | Admitting: Psychiatry

## 2023-08-26 ENCOUNTER — Encounter (HOSPITAL_COMMUNITY): Payer: Self-pay | Admitting: Psychiatry

## 2023-08-26 ENCOUNTER — Other Ambulatory Visit: Payer: Self-pay

## 2023-08-26 VITALS — BP 136/78 | HR 90 | Ht 64.0 in | Wt 115.0 lb

## 2023-08-26 DIAGNOSIS — F411 Generalized anxiety disorder: Secondary | ICD-10-CM

## 2023-08-26 DIAGNOSIS — F331 Major depressive disorder, recurrent, moderate: Secondary | ICD-10-CM | POA: Diagnosis not present

## 2023-08-26 MED ORDER — TRAZODONE HCL 50 MG PO TABS: 25.0000 mg | ORAL_TABLET | Freq: Every day | ORAL | 1 refills | Status: DC

## 2023-08-26 MED ORDER — FLUOXETINE HCL 10 MG PO CAPS
ORAL_CAPSULE | ORAL | 2 refills | Status: DC
Start: 2023-08-26 — End: 2023-10-14

## 2023-08-28 ENCOUNTER — Telehealth (HOSPITAL_COMMUNITY): Payer: Self-pay | Admitting: *Deleted

## 2023-08-28 LAB — THYROID PANEL WITH TSH
Free Thyroxine Index: 2 (ref 1.2–4.9)
T3 Uptake Ratio: 31 % (ref 24–39)
T4, Total: 6.4 ug/dL (ref 4.5–12.0)
TSH: 0.707 u[IU]/mL (ref 0.450–4.500)

## 2023-08-28 NOTE — Telephone Encounter (Signed)
Thyroid Panel w/TSH results drawn on 08/26/23 received from [facility] this morning.   All results WNL.   TSH: 0.707 Thyroxine (T4): 6.4 T3 Uptake: 31 Free Thyroxine Index: 2.0

## 2023-09-19 ENCOUNTER — Encounter (HOSPITAL_COMMUNITY): Payer: Self-pay | Admitting: Gastroenterology

## 2023-09-26 ENCOUNTER — Encounter (HOSPITAL_COMMUNITY)
Admission: RE | Disposition: A | Discharge status: Home / Self Care | Payer: Self-pay | Source: Home / Self Care | Attending: Gastroenterology

## 2023-09-26 ENCOUNTER — Ambulatory Visit (HOSPITAL_COMMUNITY): Payer: 59 | Admitting: Anesthesiology

## 2023-09-26 ENCOUNTER — Encounter (HOSPITAL_COMMUNITY): Payer: Self-pay | Admitting: Gastroenterology

## 2023-09-26 ENCOUNTER — Other Ambulatory Visit: Payer: Self-pay

## 2023-09-26 ENCOUNTER — Ambulatory Visit (HOSPITAL_COMMUNITY)
Admission: RE | Admit: 2023-09-26 | Discharge: 2023-09-26 | Disposition: A | Discharge status: Home / Self Care | Payer: 59 | Attending: Gastroenterology | Admitting: Gastroenterology

## 2023-09-26 DIAGNOSIS — K920 Hematemesis: Secondary | ICD-10-CM

## 2023-09-26 DIAGNOSIS — K5730 Diverticulosis of large intestine without perforation or abscess without bleeding: Secondary | ICD-10-CM | POA: Diagnosis not present

## 2023-09-26 DIAGNOSIS — D125 Benign neoplasm of sigmoid colon: Secondary | ICD-10-CM | POA: Diagnosis not present

## 2023-09-26 DIAGNOSIS — J449 Chronic obstructive pulmonary disease, unspecified: Secondary | ICD-10-CM | POA: Insufficient documentation

## 2023-09-26 DIAGNOSIS — K921 Melena: Secondary | ICD-10-CM | POA: Diagnosis present

## 2023-09-26 DIAGNOSIS — F17210 Nicotine dependence, cigarettes, uncomplicated: Secondary | ICD-10-CM | POA: Insufficient documentation

## 2023-09-26 DIAGNOSIS — D126 Benign neoplasm of colon, unspecified: Secondary | ICD-10-CM | POA: Diagnosis not present

## 2023-09-26 HISTORY — PX: COLONOSCOPY WITH PROPOFOL: SHX5780

## 2023-09-26 HISTORY — PX: HEMOSTASIS CLIP PLACEMENT: SHX6857

## 2023-09-26 HISTORY — PX: POLYPECTOMY: SHX5525

## 2023-09-26 SURGERY — COLONOSCOPY WITH PROPOFOL
Anesthesia: Monitor Anesthesia Care

## 2023-09-26 MED ORDER — PHENYLEPHRINE HCL (PRESSORS) 10 MG/ML IV SOLN
INTRAVENOUS | Status: DC | PRN
Start: 2023-09-26 — End: 2023-09-26
  Administered 2023-09-26 (×4): 120 ug via INTRAVENOUS

## 2023-09-26 MED ORDER — PROPOFOL 500 MG/50ML IV EMUL
INTRAVENOUS | Status: DC | PRN
  Administered 2023-09-26: 125 ug/kg/min via INTRAVENOUS
  Administered 2023-09-26 (×4): 50 mg via INTRAVENOUS

## 2023-09-26 MED ORDER — SODIUM CHLORIDE 0.9 % IV SOLN: INTRAVENOUS | Status: DC

## 2023-09-26 MED ORDER — LIDOCAINE 2% (20 MG/ML) 5 ML SYRINGE
INTRAMUSCULAR | Status: DC | PRN
Start: 2023-09-26 — End: 2023-09-26
  Administered 2023-09-26: 60 mg via INTRAVENOUS

## 2023-09-26 SURGICAL SUPPLY — 22 items

## 2023-09-26 NOTE — Anesthesia Postprocedure Evaluation (Signed)
Anesthesia Post Note  Patient: [name] [name]  Procedure(s) Performed: COLONOSCOPY WITH PROPOFOL POLYPECTOMY HEMOSTASIS CLIP PLACEMENT     Patient location during evaluation: PACU Anesthesia Type: MAC Level of consciousness: awake and alert Pain management: pain level controlled Vital Signs Assessment: post-procedure vital signs reviewed and stable Respiratory status: spontaneous breathing, nonlabored ventilation, respiratory function stable and patient connected to nasal cannula oxygen Cardiovascular status: blood pressure returned to baseline and stable Postop Assessment: no apparent nausea or vomiting Anesthetic complications: no   No notable events documented.  Last Vitals:  Vitals:   09/26/23 1010 09/26/23 1020  BP: 118/69 (!) 127/59  Pulse: 64 66  Resp: 17 14  Temp:    SpO2: 98% 99%    Last Pain:  Vitals:   09/26/23 1020  TempSrc:   PainSc: 0-No pain                 [place]

## 2023-09-26 NOTE — Op Note (Signed)
[facility] Patient Name: [name] [name] Procedure Date: 09/26/2023 MRN: [id number] Attending MD: Jeani Hawking , MD, [id number] Date of Birth: 1961-11-10 CSN: [id number] Age: 62 Admit Type: Outpatient Procedure:                Colonoscopy Indications:              Hematochezia Providers:                Jeani Hawking, MD, Suzy Bouchard, RN, Lorenza Evangelist,                            RN, Geoffery Lyons, Technician, Harrington Challenger,                            Technician Referring MD:              Medicines:                 Complications:            No immediate complications. Estimated Blood Loss:     Estimated blood loss: none. Procedure:                Pre-Anesthesia Assessment:                           - Prior to the procedure, a History and Physical                            was performed, and patient medications and                            allergies were reviewed. The patient's tolerance of                            previous anesthesia was also reviewed. The risks                            and benefits of the procedure and the sedation                            options and risks were discussed with the patient.                            All questions were answered, and informed consent                            was obtained. Prior Anticoagulants: The patient has                            taken no anticoagulant or antiplatelet agents. ASA                            Grade Assessment: III - A patient with severe                            systemic disease. After reviewing the risks  and                            benefits, the patient was deemed in satisfactory                            condition to undergo the procedure.                           - Sedation was administered by an anesthesia                            professional. Deep sedation was attained.                           After obtaining informed consent, the colonoscope                            was  passed under direct vision. Throughout the                            procedure, the patient's blood pressure, pulse, and                            oxygen saturations were monitored continuously. The                            CF-HQ190L ([id number]) Olympus colonoscope was                            introduced through the anus and advanced to the the                            cecum, identified by appendiceal orifice and                            ileocecal valve. The colonoscopy was performed                            without difficulty. The patient tolerated the                            procedure well. The quality of the bowel                            preparation was evaluated using the BBPS [facility]                            Bowel Preparation Scale) with scores of: Right                            Colon = 3, Transverse Colon = 3 and Left Colon = 3                            (  entire mucosa seen well with no residual staining,                            small fragments of stool or opaque liquid). The                            total BBPS score equals 9. The ileocecal valve,                            appendiceal orifice, and rectum were photographed. Scope In: 9:19:38 AM Scope Out: 9:43:49 AM Scope Withdrawal Time: 0 hours 21 minutes 24 seconds  Total Procedure Duration: 0 hours 24 minutes 11 seconds  Findings:      Three pedunculated and semi-pedunculated polyps were found in the       sigmoid colon. The polyps were 5 to 10 mm in size. These polyps were       removed with a hot snare. Resection and retrieval were complete.      Scattered large-mouthed, medium-mouthed and small-mouthed diverticula       were found in the entire colon.      Two hemoclips were deployed onto the polypectomy site of the largest       polyp resection. This polyp had a large pedunculated base. It is not       clear if these three polyps represent true adenomatous pathology. Impression:               -  Three 5 to 10 mm polyps in the sigmoid colon,                            removed with a hot snare. Resected and retrieved.                           - Diverticulosis in the entire examined colon. Moderate Sedation:      Not Applicable - Patient had care per Anesthesia. Recommendation:           - Patient has a contact number available for                            emergencies. The signs and symptoms of potential                            delayed complications were discussed with the                            patient. Return to normal activities tomorrow.                            Written discharge instructions were provided to the                            patient.                           - Resume previous diet.                           -  Continue present medications.                           - Await pathology results.                           - Repeat colonoscopy date to be determined after                            pending pathology results are reviewed for                            surveillance. Procedure Code(s):        --- Professional ---                           [phone], Colonoscopy, flexible; with removal of                            tumor(s), polyp(s), or other lesion(s) by snare                            technique Diagnosis Code(s):        --- Professional ---                           D12.5, Benign neoplasm of sigmoid colon                           K92.1, Melena (includes Hematochezia)                           K57.30, Diverticulosis of large intestine without                            perforation or abscess without bleeding CPT copyright 2022 American Medical Association. All rights reserved. The codes documented in this report are preliminary and upon coder review may  be revised to meet current compliance requirements. Jeani Hawking, MD Jeani Hawking, MD 09/26/2023 9:51:20 AM This report has been signed electronically. Number of Addenda: 0

## 2023-09-26 NOTE — Anesthesia Preprocedure Evaluation (Signed)
Anesthesia Evaluation  Patient identified by MRN, date of birth, ID band Patient awake    Reviewed: Allergy & Precautions, H&P , NPO status , Patient's Chart, lab work & pertinent test results  Airway Mallampati: II  TM Distance: >3 FB Neck ROM: Full    Dental no notable dental hx.    Pulmonary COPD, Current Smoker   Pulmonary exam normal breath sounds clear to auscultation       Cardiovascular negative cardio ROS Normal cardiovascular exam Rhythm:Regular Rate:Normal     Neuro/Psych  PSYCHIATRIC DISORDERS Anxiety Depression    negative neurological ROS     GI/Hepatic Neg liver ROS,GERD  ,,  Endo/Other  negative endocrine ROS    Renal/GU negative Renal ROS  negative genitourinary   Musculoskeletal  (+) Arthritis ,    Abdominal   Peds negative pediatric ROS (+)  Hematology  (+) Blood dyscrasia, anemia   Anesthesia Other Findings   Reproductive/Obstetrics negative OB ROS                             Anesthesia Physical Anesthesia Plan  ASA: 3  Anesthesia Plan: MAC   Post-op Pain Management:    Induction: Intravenous  PONV Risk Score and Plan: Propofol infusion and Treatment may vary due to age or medical condition  Airway Management Planned: Natural Airway  Additional Equipment:   Intra-op Plan:   Post-operative Plan:   Informed Consent: I have reviewed the patients History and Physical, chart, labs and discussed the procedure including the risks, benefits and alternatives for the proposed anesthesia with the patient or authorized representative who has indicated his/her understanding and acceptance.     Dental advisory given  Plan Discussed with: CRNA  Anesthesia Plan Comments:        Anesthesia Quick Evaluation

## 2023-09-26 NOTE — Transfer of Care (Signed)
Immediate Anesthesia Transfer of Care Note  Patient: [name] [name]  Procedure(s) Performed: COLONOSCOPY WITH PROPOFOL POLYPECTOMY HEMOSTASIS CLIP PLACEMENT  Patient Location: PACU  Anesthesia Type:MAC  Level of Consciousness: awake, alert , oriented, and patient cooperative  Airway & Oxygen Therapy: Patient Spontanous Breathing and Patient connected to nasal cannula oxygen  Post-op Assessment: Report given to RN and Post -op Vital signs reviewed and stable  Post vital signs: Reviewed and stable  Last Vitals:  Vitals Value Taken Time  BP 104/62 09/26/23 0954  Temp 36.3 C 09/26/23 0954  Pulse 78 09/26/23 0955  Resp 28 09/26/23 0955  SpO2 99 % 09/26/23 0955  Vitals shown include unfiled device data.  Last Pain:  Vitals:   09/26/23 0954  TempSrc: Temporal  PainSc: 0-No pain         Complications: No notable events documented.

## 2023-09-26 NOTE — Discharge Instructions (Signed)

## 2023-09-26 NOTE — H&P (Signed)
[place] HPI: Starting in 2023 she started to experience intermittent hematochezia and diarrhea.  As time progressed the her symptoms continued to worsen.  She started to experience more diarrhea and there was an association with fecal urgency.  The patient was evaluated by Dr. Ludwig Clarks in July this year and this prompted the referral.  This AM she thought she was going to have a bowel movement, but it was bleeding.  There is an aspect of pain in the anus, but she does not report any fear of pain with having a bowel movement.  Many years ago she had a colonoscopy, but she cannot recall who performed the procedure  Past Medical History:  Diagnosis Date   Anemia    Anxiety    Arthritis    OSTEOARTHRITIS   Blood dyscrasia    EASY BLEEDER   Colon polyps    COPD (chronic obstructive pulmonary disease) (HCC)    Depression    Osteoporosis    Seasonal allergies    Sickle cell trait (HCC)    Uterine fibroid     Past Surgical History:  Procedure Laterality Date   CESAREAN SECTION  1980   DILATATION & CURETTAGE/HYSTEROSCOPY WITH MYOSURE N/A 09/30/2017   Procedure: DILATATION & CURETTAGE/HYSTEROSCOPY WITH MYOSURE MYOMECTOMY;  Surgeon: Geryl Rankins, MD;  Location: [facility];  Service: Gynecology;  Laterality: N/A;  PMB    Family History  Problem Relation Age of Onset   Stomach cancer Father 46   Throat cancer Brother    Heart disease Mother    Breast cancer Neg Hx     Social History:  reports that she has been smoking cigarettes. She has a 21 pack-year smoking history. She has never used smokeless tobacco. She reports current alcohol use. She reports that she does not use drugs.  Allergies:  Allergies  Allergen Reactions   Clobetasol Emul Foam W-Moistcr     Sore throat, trouble breathing, swallowing,feeling sleepy, more hungry, passing urine more often,and weight loss    Medications: Scheduled: Continuous:  sodium chloride      No results found for this or any previous visit  (from the past 24 hour(s)).   No results found.  ROS:  As stated above in the HPI otherwise negative.  Blood pressure 121/85, temperature 97.6 F (36.4 C), resp. rate 17, SpO2 97%.    PE: Gen: NAD, Alert and Oriented HEENT:  [place]/AT, EOMI Neck: Supple, no LAD Lungs: CTA Bilaterally CV: RRR without M/G/R ABD: Soft, NTND, +BS Ext: No C/C/E  Assessment/Plan: 1) Hematochezia - colonoscopy.  Claretha Townshend D 09/26/2023, 8:59 AM

## 2023-09-29 LAB — SURGICAL PATHOLOGY

## 2023-09-30 ENCOUNTER — Encounter (HOSPITAL_COMMUNITY): Payer: Self-pay | Admitting: Gastroenterology

## 2023-10-13 NOTE — Progress Notes (Unsigned)
BH MD/PA/NP OP Progress Note  10/14/2023 9:58 AM [name] [name]  MRN:  [id number]  Visit Diagnosis:    ICD-10-CM   1. GAD (generalized anxiety disorder)  F41.1 FLUoxetine (PROZAC) 10 MG capsule    traZODone (DESYREL) 50 MG tablet    2. Moderate episode of recurrent major depressive disorder (HCC)  F33.1 FLUoxetine (PROZAC) 10 MG capsule    traZODone (DESYREL) 50 MG tablet        Assessment: [name] [name] is a 62 y.o. female with a history of MDD, COPD who presented in person to [facility] at [facility] for initial evaluation on 03/12/2023.    At initial evaluation patient reported symptoms of low mood, anhedonia, amotivation, poor sleep, weight loss, some negative self thoughts, and passive SI.  She denied any intent or plan to act on it.  [name] [name] also endorsed significant symptoms of anxiety including constant worry that she is unable to control, fear of something awful happening, restlessness, difficulty relaxing, and racing thoughts.  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  Patient met criteria for MDD and GAD.  [name] [name] presents for follow-up evaluation. Today, 10/14/23, patient reports that her depression has improved in the interim.  She noticed no significant change in mood after discontinuing Wellbutrin while weight loss did cease.  Patient has not gained back any weight but has remained stable at 118 pounds.  Recently there has been a little bit of increased grief secondary to losses and the holiday season however patient reports this is typical for her around this time a year.  TSH checked at last visit was within normal limits.  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  Plan: - Discontinued Wellbutrin Xl  - Continue  Prozac 10 mg QD - Continue Trazodone 25 mg QHS - Can consider therapy referral in the future - Crisis resources reviewed - Follow up in 2-3 months  Chief Complaint:  Chief Complaint  Patient presents with   Follow-up   HPI: [name] [name] presents for reporting that she is pretty good. Depression had not significantly changed with discontinuation of Wellbutrin. Some normal grief with the holidays thinking about her loved one. Other then that things are not bad. She had a colonoscopy earlier that month that showed some polyps which were removed. Now she has to go back in 3 years. Still has plaque psoriasis as well, which has occasional flare ups and can impact weight loss. [name] [name] has noticed that she is no longer losing weight since she stopped the Wellbutrin. Though has not gained weight either.  Past Psychiatric History: Patient had connected with outpatient psychiatrist in the late 1990s named Dr. Laureen Ochs at the [facility].  She followed with him for about 5 years before discontinuing.  She believes she was prescribed trazodone at that time and discontinued it after finishing with him.  Patient's PCP had tried bupropion standard release twice daily. We switched to XL though discontinued due to patient losing weight  Currently on Prozac, and trazodone  Patient has significant history of past substance use primarily cocaine and marijuana occurred around 20 to 30 years ago.  She has discontinued that since.  Currently she endorses alcohol use around 1-2 times a week and nicotine use.  She denies any other substance use.  Past Medical History:  Past Medical History:  Diagnosis Date   Anemia    Anxiety    Arthritis    OSTEOARTHRITIS   Blood dyscrasia    EASY BLEEDER   Colon polyps    COPD (chronic obstructive pulmonary disease) (HCC)    Depression    Osteoporosis    Seasonal allergies    Sickle cell trait (HCC)    Uterine fibroid     Past Surgical History:  Procedure Laterality Date    CESAREAN SECTION  1980   COLONOSCOPY WITH PROPOFOL N/A 09/26/2023   Procedure: COLONOSCOPY WITH PROPOFOL;  Surgeon: Jeani Hawking, MD;  Location: [facility];  Service: Gastroenterology;  Laterality: N/A;   DILATATION & CURETTAGE/HYSTEROSCOPY WITH MYOSURE N/A 09/30/2017   Procedure: DILATATION & CURETTAGE/HYSTEROSCOPY WITH MYOSURE MYOMECTOMY;  Surgeon: Geryl Rankins, MD;  Location: [facility];  Service: Gynecology;  Laterality: N/A;  PMB   HEMOSTASIS CLIP PLACEMENT  09/26/2023   Procedure: HEMOSTASIS CLIP PLACEMENT;  Surgeon: Jeani Hawking, MD;  Location: [facility];  Service: Gastroenterology;;   POLYPECTOMY  09/26/2023   Procedure: POLYPECTOMY;  Surgeon: Jeani Hawking, MD;  Location: [facility];  Service: Gastroenterology;;    Family History:  Family History  Problem Relation Age of Onset   Stomach cancer Father 18   Throat cancer Brother    Heart disease Mother    Breast cancer Neg Hx     Social History:  Social History   Socioeconomic History   Marital status: Single    Spouse name: Not on file   Number of children: 1   Years of education: Not on file   Highest education level: Not on file  Occupational History   Not on file  Tobacco Use   Smoking status: Some Days    Current packs/day: 0.50    Average packs/day: 0.5 packs/day for 42.0 years (21.0 ttl pk-yrs)    Types: Cigarettes   Smokeless tobacco: Never  Vaping Use   Vaping status: Never Used  Substance and Sexual Activity   Alcohol use: Yes    Alcohol/week: 0.0 standard drinks of alcohol    Comment: occasional   Drug use: No   Sexual activity: Not on file  Other Topics Concern   Not on file  Social History Narrative   ** Merged History Encounter **       Social Determinants of Health   Financial Resource Strain: Not on file  Food Insecurity: Not on file  Transportation Needs: Not on file  Physical Activity: Not on file  Stress: Not on file  Social Connections: Not on file    Allergies:   Allergies  Allergen Reactions   Clobetasol Emul Foam W-Moistcr     Sore throat, trouble breathing, swallowing,feeling sleepy, more hungry, passing urine more often,and weight loss    Current Medications: Current Outpatient Medications  Medication Sig Dispense Refill   acetaminophen (TYLENOL) 325 MG tablet Take 325-650 mg by mouth every 6 (six) hours as needed (pain).     albuterol (PROVENTIL) (2.5 MG/3ML) 0.083% nebulizer solution Take 3 mLs (2.5 mg total) by nebulization every 6 (six) hours as needed for wheezing or shortness of breath. 360 mL 12   albuterol (VENTOLIN HFA) 108 (90 Base) MCG/ACT inhaler INHALE 2 PUFFS INTO THE LUNGS EVERY 6 HOURS AS NEEDED FOR WHEEZING OR SHORTNESS OF BREATH 6.7 g 3   amLODipine (NORVASC) 5 MG tablet Take 5 mg by mouth every morning.     colchicine 0.6 MG tablet Take 1 tablet (0.6 mg  total) by mouth daily. Take 2 tablets today as soon as you pick up the prescription.  And then 1 more tablet tonight before bed.  If your pain is persisting when you return tomorrow morning continue to take 1 tablet in the morning and 1 at night until your pain resolves. (Patient taking differently: Take 0.6 mg by mouth daily. Take 2 tablets today as soon as you pick up the prescription.  And then 1 more tablet tonight before bed.  If your pain is persisting when you return tomorrow morning continue to take 1 tablet in the morning and 1 at night until your pain resolves.) 14 tablet 0   denosumab (PROLIA) 60 MG/ML SOSY injection Inject 60 mg into the skin every 6 (six) months.     FLUoxetine (PROZAC) 10 MG capsule TAKE 1 CAPSULE(10 MG) BY MOUTH DAILY 90 capsule 0   HYDROcodone-acetaminophen (NORCO) 5-325 MG tablet Take 1 tablet by mouth every 6 (six) hours as needed. 20 tablet 0   hydroxychloroquine (PLAQUENIL) 200 MG tablet Take 1 tablet (200 mg total) by mouth 2 (two) times daily. 60 tablet 1   Multiple Vitamin (MULTIVITAMIN WITH MINERALS) TABS tablet Take 1 tablet by mouth every  morning.     oxyCODONE-acetaminophen (PERCOCET/ROXICET) 5-325 MG tablet Take 1 tablet by mouth every 6 (six) hours as needed for severe pain. 10 tablet 0   OXYGEN Inhale 5 L into the lungs continuous.     Tiotropium Bromide-Olodaterol (STIOLTO RESPIMAT) 2.5-2.5 MCG/ACT AERS Inhale 2 puffs into the lungs daily. 4 g 12   traZODone (DESYREL) 50 MG tablet Take 0.5 tablets (25 mg total) by mouth at bedtime. 45 tablet 0   No current facility-administered medications for this visit.     Musculoskeletal: Strength & Muscle Tone: within normal limits Gait & Station: normal Patient leans: N/A  Psychiatric Specialty Exam: Review of Systems  There were no vitals taken for this visit.There is no height or weight on file to calculate BMI.  General Appearance: Fairly Groomed  Eye Contact:  Good  Speech:  Clear and Coherent and Normal Rate  Volume:  Normal  Mood:  Depressed  Affect:  Congruent  Thought Process:  Coherent  Orientation:  Full (Time, Place, and Person)  Thought Content: Logical   Suicidal Thoughts:  No  Homicidal Thoughts:  No  Memory:  Immediate;   Good  Judgement:  Fair  Insight:  Fair  Psychomotor Activity:  Normal  Concentration:  Concentration: Good  Recall:  Good  Fund of Knowledge: Fair  Language: Good  Akathisia:  NA    AIMS (if indicated): not done  Assets:  Tax adviser  ADL's:  Intact  Cognition: WNL  Sleep:  Good   Metabolic Disorder Labs: No results found for: "HGBA1C", "MPG" No results found for: "PROLACTIN" No results found for: "CHOL", "TRIG", "HDL", "CHOLHDL", "VLDL", "LDLCALC" Lab Results  Component Value Date   TSH 0.707 08/26/2023    Therapeutic Level Labs: No results found for: "LITHIUM" No results found for: "VALPROATE" No results found for: "CBMZ"   Screenings: GAD-7    Flowsheet Row [facility]  Total GAD-7 Score 12      PHQ2-9    Flowsheet Row [facility]12/2023 [facility]  PHQ-2 Total Score 2  PHQ-9 Total Score 10      Flowsheet Row Admission (Discharged) from [facility]/20[facility]  ED from [facility]/19/2023 in [facility] Office Visit [facility]/20[facility]  C-SSRS RISK CATEGORY No Risk No Risk Low Risk       Collaboration of Care: Collaboration of Care: Medication Management AEB medication prescription and Other provider involved in patient's care AEB gastroenterology chart review  Patient/Guardian was advised Release of Information must be obtained prior to any record release in order to collaborate their care with an outside provider. Patient/Guardian was advised if they have not already done so to contact the registration department to sign all necessary forms in order for [place] to release information regarding their care.   Consent: Patient/Guardian gives verbal consent for treatment and assignment of benefits for services provided during this visit. Patient/Guardian expressed understanding and agreed to proceed.    Virtual Visit via Video Note  I connected with Benard Rink on 10/14/23 at  9:00 AM EDT by a video enabled telemedicine application and verified that I am speaking with the correct person using two identifiers.  Location: Patient: Home Provider: Home Office   I discussed the limitations of evaluation and management by telemedicine and the availability of in person appointments. The patient expressed understanding and agreed to proceed.   I discussed the assessment and treatment plan with the patient. The patient was provided an opportunity to ask questions and all were answered. The patient agreed with the plan and demonstrated an understanding of the  instructions.   The patient was advised to call back or seek an in-person evaluation if the symptoms worsen or if the condition fails to improve as anticipated.  I provided 15 minutes of non-face-to-face time during this encounter.   Stasia Cavalier, MD 10/14/2023, 9:58 AM

## 2023-10-14 ENCOUNTER — Encounter (HOSPITAL_COMMUNITY): Payer: Self-pay | Admitting: Psychiatry

## 2023-10-14 ENCOUNTER — Telehealth (HOSPITAL_COMMUNITY): Payer: 59 | Admitting: Psychiatry

## 2023-10-14 DIAGNOSIS — F331 Major depressive disorder, recurrent, moderate: Secondary | ICD-10-CM | POA: Diagnosis not present

## 2023-10-14 DIAGNOSIS — F411 Generalized anxiety disorder: Secondary | ICD-10-CM | POA: Diagnosis not present

## 2023-10-14 MED ORDER — FLUOXETINE HCL 10 MG PO CAPS
ORAL_CAPSULE | ORAL | 0 refills | Status: DC
Start: 2023-10-14 — End: 2023-12-30

## 2023-10-14 MED ORDER — TRAZODONE HCL 50 MG PO TABS
25.0000 mg | ORAL_TABLET | Freq: Every day | ORAL | 0 refills | Status: DC
Start: 2023-10-14 — End: 2023-12-30

## 2023-10-30 ENCOUNTER — Other Ambulatory Visit: Payer: Self-pay | Admitting: Internal Medicine

## 2023-10-30 NOTE — Telephone Encounter (Signed)
Pt last filled was 10/08/22 is this okay to fill

## 2023-12-18 ENCOUNTER — Ambulatory Visit
Admission: RE | Admit: 2023-12-18 | Discharge: 2023-12-18 | Disposition: A | Discharge status: Home / Self Care | Payer: 59 | Source: Ambulatory Visit | Attending: Internal Medicine | Admitting: Internal Medicine

## 2023-12-18 DIAGNOSIS — F17210 Nicotine dependence, cigarettes, uncomplicated: Secondary | ICD-10-CM

## 2023-12-18 DIAGNOSIS — Z87891 Personal history of nicotine dependence: Secondary | ICD-10-CM

## 2023-12-18 DIAGNOSIS — Z122 Encounter for screening for malignant neoplasm of respiratory organs: Secondary | ICD-10-CM

## 2023-12-24 ENCOUNTER — Other Ambulatory Visit: Payer: Self-pay | Admitting: Internal Medicine

## 2023-12-29 NOTE — Progress Notes (Signed)
 BH MD/PA/NP OP Progress Note  12/30/2023 10:29 AM [name] [name]  MRN:  [id number]  Visit Diagnosis:    ICD-10-CM   1. GAD (generalized anxiety disorder)  F41.1 traZODone  (DESYREL ) 50 MG tablet    FLUoxetine  (PROZAC ) 20 MG capsule    2. Moderate episode of recurrent major depressive disorder (HCC)  F33.1 traZODone  (DESYREL ) 50 MG tablet    FLUoxetine  (PROZAC ) 20 MG capsule    buPROPion  (WELLBUTRIN  XL) 150 MG 24 hr tablet      Assessment: [name] [name] is a 63 y.o. female with a history of MDD, COPD who presented to [facility] at [facility] for initial evaluation on 03/12/2023.    At initial evaluation patient reported symptoms of low mood, anhedonia, amotivation, poor sleep, weight loss, some negative self thoughts, and passive SI.  She denied any intent or plan to act on it.  [name] [name] also endorsed significant symptoms of anxiety including constant worry that she is unable to control, fear of something awful happening, restlessness, difficulty relaxing, and racing thoughts.  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  Patient met criteria for MDD and GAD.  [name] [name] presents for follow-up evaluation. Today, 12/30/23, patient reports worsening of her depression in the interim.  Patient lists a number of stressors including continued issues with her physical health.  She has increased her nicotine  usage and endorsed increased fatigue, amotivation, and anhedonia.  Also of note she has continued to lose weight around 3 pounds over the past few months despite being off of Wellbutrin .  This being the case it is possible the weight loss is due to a separate cause.  We will restart Wellbutrin  today and increase Prozac  to 20 mg daily.  Patient had also self increased trazodone  to 50 mg due to insomnia.  Reviewed the importance of  discussing with the provider though agree that an increase to 50 mg would be appropriate.  Risk and benefits of all these adjustments have been discussed.  Patient will follow up in 2 months.  Plan: - Restart Wellbutrin  Xl 150 mg - Increase Prozac  to 20 mg QD - Increase Trazodone  to 50 mg QHS - Can consider therapy referral in the future - Crisis resources reviewed - Follow up in 2 months   Chief Complaint:  Chief Complaint  Patient presents with   Follow-up   HPI: [name] [name] presents for reporting that things have been difficult. She has been tired and her health conditions have been getting worse with her breathing and skin issues. She is frustrated with the lack of improvement in her physical symptoms and believes this is negatively impacting her depression.  Over the past 3 months she has found that her depression has gradually been getting worse and in turn she has increased her nicotine  use.  He has also endorsed worsening sleep, motivation, fatigue, and anhedonia which has resulted in frequently missing work.  The weight loss has also continued with patient losing additional 3 pounds in the interim.  She questions now whether the prior weight loss was due to the Wellbutrin  or due to one of her other medical causes/decreased appetite.  As patient's depression has been getting worse we suggested restarting Wellbutrin  and titrating the Prozac  today.  Risk and benefits of both were reviewed.  Also of note patient had increased trazodone  to 50 mg in the interim  as she was no longer noticing benefit from 25 mg dose.  50 mg seems to be more effective though she can still have difficulty staying asleep throughout the night.  This appears to be more related to frequently having to use the bathroom and it was suggested that she discuss this with her primary care doctor or her urologist.  Past Psychiatric History: Patient had connected with outpatient psychiatrist in the late 1990s named Dr. Freya at the  [facility].  She followed with him for about 5 years before discontinuing.  She believes she was prescribed trazodone  at that time and discontinued it after finishing with him.  Patient's PCP had tried bupropion  standard release twice daily. We switched to XL though discontinued due to patient losing weight  Currently on Prozac , and trazodone   Patient has significant history of past substance use primarily cocaine and marijuana occurred around 20 to 30 years ago.  She has discontinued that since.  Currently she endorses alcohol use around 1-2 times a week and nicotine  use.  She denies any other substance use.  Past Medical History:  Past Medical History:  Diagnosis Date   Anemia    Anxiety    Arthritis    OSTEOARTHRITIS   Blood dyscrasia    EASY BLEEDER   Colon polyps    COPD (chronic obstructive pulmonary disease) (HCC)    Depression    Osteoporosis    Seasonal allergies    Sickle cell trait (HCC)    Uterine fibroid     Past Surgical History:  Procedure Laterality Date   CESAREAN SECTION  1980   COLONOSCOPY WITH PROPOFOL  N/A 09/26/2023   Procedure: COLONOSCOPY WITH PROPOFOL ;  Surgeon: Rollin Dover, MD;  Location: [facility];  Service: Gastroenterology;  Laterality: N/A;   DILATATION & CURETTAGE/HYSTEROSCOPY WITH MYOSURE N/A 09/30/2017   Procedure: DILATATION & CURETTAGE/HYSTEROSCOPY WITH MYOSURE MYOMECTOMY;  Surgeon: Timmie Norris, MD;  Location: [facility];  Service: Gynecology;  Laterality: N/A;  PMB   HEMOSTASIS CLIP PLACEMENT  09/26/2023   Procedure: HEMOSTASIS CLIP PLACEMENT;  Surgeon: Rollin Dover, MD;  Location: [facility];  Service: Gastroenterology;;   POLYPECTOMY  09/26/2023   Procedure: POLYPECTOMY;  Surgeon: Rollin Dover, MD;  Location: [facility];  Service: Gastroenterology;;   Family History:  Family History  Problem Relation Age of Onset   Stomach cancer Father 85   Throat cancer Brother    Heart disease Mother    Breast cancer Neg Hx      Social History:  Social History   Socioeconomic History   Marital status: Single    Spouse name: Not on file   Number of children: 1   Years of education: Not on file   Highest education level: Not on file  Occupational History   Not on file  Tobacco Use   Smoking status: Some Days    Current packs/day: 0.50    Average packs/day: 0.5 packs/day for 42.0 years (21.0 ttl pk-yrs)    Types: Cigarettes   Smokeless tobacco: Never  Vaping Use   Vaping status: Never Used  Substance and Sexual Activity   Alcohol use: Yes    Alcohol/week: 0.0 standard drinks of alcohol    Comment: occasional   Drug use: No   Sexual activity: Not on file  Other Topics Concern   Not on file  Social History Narrative   ** Merged History Encounter **       Social Drivers of Corporate Investment Banker Strain: Not on file  Food  Insecurity: Not on file  Transportation Needs: Not on file  Physical Activity: Not on file  Stress: Not on file  Social Connections: Not on file    Allergies:  Allergies  Allergen Reactions   Clobetasol  Emul Foam W-Moistcr     Sore throat, trouble breathing, swallowing,feeling sleepy, more hungry, passing urine more often,and weight loss    Current Medications: Current Outpatient Medications  Medication Sig Dispense Refill   buPROPion  (WELLBUTRIN  XL) 150 MG 24 hr tablet Take 1 tablet (150 mg total) by mouth daily. 90 tablet 0   acetaminophen  (TYLENOL ) 325 MG tablet Take 325-650 mg by mouth every 6 (six) hours as needed (pain).     albuterol  (PROVENTIL ) (2.5 MG/3ML) 0.083% nebulizer solution Take 3 mLs (2.5 mg total) by nebulization every 6 (six) hours as needed for wheezing or shortness of breath. 360 mL 12   albuterol  (VENTOLIN  HFA) 108 (90 Base) MCG/ACT inhaler INHALE 2 PUFFS INTO THE LUNGS EVERY 6 HOURS AS NEEDED FOR WHEEZING OR SHORTNESS OF BREATH 6.7 g 3   amLODipine  (NORVASC ) 5 MG tablet Take 5 mg by mouth every morning.     colchicine  0.6 MG tablet Take 1  tablet (0.6 mg total) by mouth daily. Take 2 tablets today as soon as you pick up the prescription.  And then 1 more tablet tonight before bed.  If your pain is persisting when you return tomorrow morning continue to take 1 tablet in the morning and 1 at night until your pain resolves. (Patient taking differently: Take 0.6 mg by mouth daily. Take 2 tablets today as soon as you pick up the prescription.  And then 1 more tablet tonight before bed.  If your pain is persisting when you return tomorrow morning continue to take 1 tablet in the morning and 1 at night until your pain resolves.) 14 tablet 0   denosumab (PROLIA) 60 MG/ML SOSY injection Inject 60 mg into the skin every 6 (six) months.     FLUoxetine  (PROZAC ) 20 MG capsule TAKE 1 CAPSULE(10 MG) BY MOUTH DAILY 90 capsule 0   HYDROcodone -acetaminophen  (NORCO) 5-325 MG tablet Take 1 tablet by mouth every 6 (six) hours as needed. 20 tablet 0   hydroxychloroquine  (PLAQUENIL ) 200 MG tablet Take 1 tablet (200 mg total) by mouth 2 (two) times daily. 60 tablet 1   Multiple Vitamin (MULTIVITAMIN WITH MINERALS) TABS tablet Take 1 tablet by mouth every morning.     oxyCODONE -acetaminophen  (PERCOCET/ROXICET) 5-325 MG tablet Take 1 tablet by mouth every 6 (six) hours as needed for severe pain. 10 tablet 0   OXYGEN  Inhale 5 L into the lungs continuous.     Tiotropium Bromide -Olodaterol (STIOLTO RESPIMAT ) 2.5-2.5 MCG/ACT AERS INHALE 2 PUFFS INTO THE LUNGS DAILY 4 g 3   traZODone  (DESYREL ) 50 MG tablet Take 1 tablet (50 mg total) by mouth at bedtime. 90 tablet 0   No current facility-administered medications for this visit.     Psychiatric Specialty Exam: Review of Systems  There were no vitals taken for this visit.There is no height or weight on file to calculate BMI.  General Appearance: Fairly Groomed and oxygen  tube on  Eye Contact:  Good  Speech:  Clear and Coherent  Volume:  Normal  Mood:  Depressed  Affect:  Congruent  Thought Process:  Coherent   Orientation:  Full (Time, Place, and Person)  Thought Content: Logical   Suicidal Thoughts:  No  Homicidal Thoughts:  No  Memory:  Immediate;   Fair  Judgement:  Fair  Insight:  Fair  Psychomotor Activity:  Normal  Concentration:  Concentration: Fair  Recall:  [employer] of Knowledge: Fair  Language: Good  Akathisia:  NA    AIMS (if indicated): not done  Assets:  Communication Skills Desire for Improvement Financial Resources/Insurance  ADL's:  Intact  Cognition: WNL  Sleep:  Fair   Metabolic Disorder Labs: No results found for: HGBA1C, MPG No results found for: PROLACTIN No results found for: CHOL, TRIG, HDL, CHOLHDL, VLDL, LDLCALC Lab Results  Component Value Date   TSH 0.707 08/26/2023    Therapeutic Level Labs: No results found for: LITHIUM No results found for: VALPROATE No results found for: CBMZ   Screenings: GAD-7    Flowsheet Row [facility]12/20[facility]  Total GAD-7 Score 12      PHQ2-9    Flowsheet Row Office Visit from 03/12/2023 in [facility]  PHQ-2 Total Score 2  PHQ-9 Total Score 10      Flowsheet Row Admission (Discharged) from 09/26/2023 in [facility] ED from 05/19/2023 in [facility] Office Visit from 03/12/2023 in [facility]  C-SSRS RISK CATEGORY No Risk No Risk Low Risk       Collaboration of Care: Collaboration of Care: Medication Management AEB medication prescription  Patient/Guardian was advised Release of Information must be obtained prior to any record release in order to collaborate their care with an outside provider. Patient/Guardian was advised if they have not already done so to contact the registration department to sign all necessary forms in order for us  to release information regarding  their care.   Consent: Patient/Guardian gives verbal consent for treatment and assignment of benefits for services provided during this visit. Patient/Guardian expressed understanding and agreed to proceed.    Arvella CHRISTELLA Finder, MD 12/30/2023, 10:29 AM   Virtual Visit via Video Note  I connected with Erle Medicus on 12/30/23 at  9:00 AM EST by a video enabled telemedicine application and verified that I am speaking with the correct person using two identifiers.  Location: Patient: Home Provider: Home Office   I discussed the limitations of evaluation and management by telemedicine and the availability of in person appointments. The patient expressed understanding and agreed to proceed.   I discussed the assessment and treatment plan with the patient. The patient was provided an opportunity to ask questions and all were answered. The patient agreed with the plan and demonstrated an understanding of the instructions.   The patient was advised to call back or seek an in-person evaluation if the symptoms worsen or if the condition fails to improve as anticipated.  I provided 15 minutes of non-face-to-face time during this encounter.   Arvella CHRISTELLA Finder, MD

## 2023-12-30 ENCOUNTER — Telehealth (HOSPITAL_COMMUNITY): Payer: 59 | Admitting: Psychiatry

## 2023-12-30 ENCOUNTER — Encounter (HOSPITAL_COMMUNITY): Payer: Self-pay | Admitting: Psychiatry

## 2023-12-30 ENCOUNTER — Other Ambulatory Visit: Payer: Self-pay

## 2023-12-30 DIAGNOSIS — F17210 Nicotine dependence, cigarettes, uncomplicated: Secondary | ICD-10-CM

## 2023-12-30 DIAGNOSIS — F411 Generalized anxiety disorder: Secondary | ICD-10-CM

## 2023-12-30 DIAGNOSIS — F331 Major depressive disorder, recurrent, moderate: Secondary | ICD-10-CM | POA: Diagnosis not present

## 2023-12-30 DIAGNOSIS — Z87891 Personal history of nicotine dependence: Secondary | ICD-10-CM

## 2023-12-30 DIAGNOSIS — Z122 Encounter for screening for malignant neoplasm of respiratory organs: Secondary | ICD-10-CM

## 2023-12-30 MED ORDER — TRAZODONE HCL 50 MG PO TABS
50.0000 mg | ORAL_TABLET | Freq: Every day | ORAL | 0 refills | Status: DC
Start: 2023-12-30 — End: 2024-03-04

## 2023-12-30 MED ORDER — BUPROPION HCL ER (XL) 150 MG PO TB24
150.0000 mg | ORAL_TABLET | Freq: Every day | ORAL | 0 refills | Status: DC
Start: 2023-12-30 — End: 2024-04-08

## 2023-12-30 MED ORDER — FLUOXETINE HCL 20 MG PO CAPS
ORAL_CAPSULE | ORAL | 0 refills | Status: DC
Start: 2023-12-30 — End: 2024-01-08

## 2024-01-06 ENCOUNTER — Telehealth: Payer: Self-pay | Admitting: Internal Medicine

## 2024-01-06 NOTE — Telephone Encounter (Signed)
PT made a 1 year FU appt. It says on her last AVS:  No change in medication   The key is to stop smoking completely before smoking completely stops you!    Keep appt for your lung scan     Please schedule a follow up visit in 12 months but call sooner if needed   I do not see an order in for one. Did Dr. Charise Killian? Thanks

## 2024-01-06 NOTE — Progress Notes (Deleted)
[name] [name], female    DOB: February 13, 1961     MRN: [id number]   Synopsis:  56 yobf active smoker  1st referred in 2017 for evaluation of COPD/  2016 pulmonary function testing from [facility] showed FEV1 of 870 mL (37% predicted) consistent with severe airflow obstruction 2015 CT scan from [facility] showed severe panlobular emphysema She is prescribed 2 L O2 qHS       06/10/2018  Acute extended  ov/[name] re: post ER eval  06/08/18 dx aecopd/bronchitis rx zpak  / pt with GOLD III criteria / still smoking Chief Complaint  Patient presents with   Acute Visit    hemoptysis since 06/08/18.    Dyspnea:  MMRC2 = can't walk a nl pace on a flat grade s sob but does fine slow and flat does [employer] 0k s 02 and that hasn't changed since onset of hemoptysis Cough: acute onset  on 06/08/18 mostly mucoid with streaky hemoptysis assoc with lots of nasal congestion but no purulent / bloody d/c  SABA use: has it and says helps but hfa at 0% effective baseline technique rec Prednisone 10 mg take  4 each am x 2 days,   2 each am x 2 days,  1 each am x 2 days and stop  Finish your antibiotics  Try change spiriva to the respimat 1.25  X 4 pffs each am - no change on serevent for now  Work on inhaler technique: e only use your albuterol (PROVENTIL)  as a rescue medication           03/28/2022  f/u ov/[name] re: GOLD 3 copd / RML atx   maint on  stiolto  Chief Complaint  Patient presents with   Follow-up    Follow up. Patient has no complaints.   Dyspnea:  [employer] walking, uses hc parking  Cough: minimal  Sleeping: bed is flat/ two pillows  SABA use: maybe  twice daily usually p exerts 02: 3lpm at hs / no portable  Covid status:  all vax  Rec Plan A = Automatic = Always=    Stiolto  2 puffs each am  Work on inhaler technique:    - remember how the golfers take practice swings Plan B = Backup (to supplement plan A, not to replace it) Only use your albuterol inhaler as a rescue medication Plan C = Crisis  (instead of Plan B but only if Plan B stops working) - only use your albuterol nebulizer if you first try Plan B  Ok to try albuterol 15 min before an activity (on alternating days)  that you know would usually make you short of breath     Please schedule a follow up visit in 3 months but call sooner if needed   12/18/2021 referred for best fit> done 04/29/22 needed 4lpm pulse with walking, rec 5lpm pulse with heavier exertion and ML6 refillable tanks   04/15/2023  f/u ov/[name] re: GOLD 3/02 dep 24/7    maint on stiolto - did not bring meds   Chief Complaint  Patient presents with   Follow-up    Breathing doing well.  Dyspnea:  around the block on 2lpm/ not chekcing sats  Cough: none Sleeping: flat bed / 2 pillows  SABA use: not often 02: 2lpm 24/7  Lung cancer screening :  in program due 11/2023    Rec No change in medication The key is to stop smoking completely before smoking completely stops you!  01/07/2024  f/u ov/[name] re: ***   maint on ***  No chief complaint on file.   Dyspnea:  *** Cough: *** Sleeping: *** resp cc  SABA use: *** 02: ***  Lung cancer screening :  ***    No obvious day to day or daytime variability or assoc excess/ purulent sputum or mucus plugs or hemoptysis or cp or chest tightness, subjective wheeze or overt sinus or hb symptoms.    Also denies any obvious fluctuation of symptoms with weather or environmental changes or other aggravating or alleviating factors except as outlined above   No unusual exposure hx or h/o childhood pna/ asthma or knowledge of premature birth.  Current Allergies, Complete Past Medical History, Past Surgical History, Family History, and Social History were reviewed in [employer] record.  ROS  The following are not active complaints unless bolded Hoarseness, sore throat, dysphagia, dental problems, itching, sneezing,  nasal congestion or discharge of excess mucus or purulent secretions, ear ache,    fever, chills, sweats, unintended wt loss or wt gain, classically pleuritic or exertional cp,  orthopnea pnd or arm/hand swelling  or leg swelling, presyncope, palpitations, abdominal pain, anorexia, nausea, vomiting, diarrhea  or change in bowel habits or change in bladder habits, change in stools or change in urine, dysuria, hematuria,  rash, arthralgias, visual complaints, headache, numbness, weakness or ataxia or problems with walking or coordination,  change in mood or  memory.        No outpatient medications have been marked as taking for the 01/07/24 encounter (Appointment) with [name], MD.                  Objective:     Wts  01/07/2024        ***  04/15/2023       127  10/14/2022     130  07/08/2022       131  03/28/2022       134  12/18/2021         133  09/10/2021      136  09/26/2020     132 05/25/2020       131  06/10/18 129 lb 9.6 oz (58.8 kg)  03/17/18 133 lb 12.8 oz (60.7 kg)  01/08/18 138 lb (62.6 kg)    Vital signs reviewed  01/07/2024  - Note at rest 02 sats  ***% on ***   General appearance:    ***    Mod barr***        I personally reviewed images and agree with radiology impression as follows:   Chest LDSCT     12/18/23  RAD 2/ centrilobular emphysema     Assessment

## 2024-01-07 ENCOUNTER — Ambulatory Visit: Payer: Medicaid Other | Admitting: Internal Medicine

## 2024-01-07 ENCOUNTER — Telehealth (HOSPITAL_COMMUNITY): Payer: Self-pay

## 2024-01-07 NOTE — Telephone Encounter (Signed)
Patient is calling because she believes her medication is too strong. She would like a call back, please review and advise, thank you

## 2024-01-07 NOTE — Telephone Encounter (Signed)
Her scans are ordered through the screening program and are in place per chart review.  Closing this encounter.

## 2024-01-08 ENCOUNTER — Telehealth (HOSPITAL_COMMUNITY): Payer: Self-pay | Admitting: Psychiatry

## 2024-01-08 DIAGNOSIS — F411 Generalized anxiety disorder: Secondary | ICD-10-CM

## 2024-01-08 DIAGNOSIS — F331 Major depressive disorder, recurrent, moderate: Secondary | ICD-10-CM

## 2024-01-08 MED ORDER — FLUOXETINE HCL 10 MG PO CAPS
ORAL_CAPSULE | ORAL | 1 refills | Status: DC
Start: 2024-01-08 — End: 2024-03-04

## 2024-01-08 NOTE — Telephone Encounter (Signed)
This Clinical research associate is covering for Dr. Mercy Riding while out of office. Received message from clinical staff that patient called reporting medication is too strong for her.  Called patient back for approx. 5 minute phone call: She states she increased Prozac and restarted Wellbutrin and since that time has felt "shaky and out of it" with more trouble sleeping at night. She verifies she is taking both medications in the morning. She feels increase in Prozac is to blame given she had been on WBT before. Discussed that activation symptoms may improve with time however she feels they are intolerable and does not want to stay at current dose. Instructed patient to decrease Prozac back to 10 mg daily while continuing WBT and trazodone as prescribed. Encouraged patient to reach back out to clinic early next week if side effects do not resolve. It is possible that Prozac and/or Wellbutrin may be overactivating for patient and she may require switch to alternative medication. She denies SI/HI.   Of note, patient appeared to have poor health literacy of medication names; during call made sure patient had medications in front of her to verify exactly what she is taking. Patient was able to communicate above plan back to provider.  Daine Gip, MD 01/08/24

## 2024-02-27 ENCOUNTER — Other Ambulatory Visit: Payer: Self-pay | Admitting: Internal Medicine

## 2024-02-27 DIAGNOSIS — N6310 Unspecified lump in the right breast, unspecified quadrant: Secondary | ICD-10-CM

## 2024-02-29 NOTE — Progress Notes (Unsigned)
 [name] [name], female    DOB: [birth year]     MRN: [id number]   Synopsis:  73 yobf active smoker  1st referred in 2017 for evaluation of COPD/  2016 pulmonary function testing from [facility] showed FEV1 of 870 mL (37% predicted) consistent with severe airflow obstruction 2015 CT scan from [facility] showed severe panlobular emphysema She is prescribed 2 L O2 qHS       06/10/2018  Acute extended  ov/[name] [name] re: post ER eval  06/08/18 dx aecopd/bronchitis rx zpak  / pt with GOLD III criteria / still smoking Chief Complaint  Patient presents with   Acute Visit    hemoptysis since 06/08/18.    Dyspnea:  MMRC2 = can't walk a nl pace on a flat grade s sob but does fine slow and flat does [employer] 0k s 02 and that hasn't changed since onset of hemoptysis Cough: acute onset  on 06/08/18 mostly mucoid with streaky hemoptysis assoc with lots of nasal congestion but no purulent / bloody d/c  SABA use: has it and says helps but hfa at 0% effective baseline technique rec Prednisone 10 mg take  4 each am x 2 days,   2 each am x 2 days,  1 each am x 2 days and stop  Finish your antibiotics  Try change spiriva to the respimat 1.25  X 4 pffs each am - no change on serevent for now  Work on inhaler technique: e only use your albuterol (PROVENTIL)  as a rescue medication           03/28/2022  f/u ov/[name] [name] re: GOLD 3 copd / RML atx   maint on  stiolto  Chief Complaint  Patient presents with   Follow-up    Follow up. Patient has no complaints.   Dyspnea:  [employer] walking, uses [employer]c parking  Cough: minimal  Sleeping: bed is flat/ two pillows  SABA use: maybe  twice daily usually p exerts 02: 3lpm at hs / no portable  Covid status:  all vax  Rec Plan A = Automatic = Always=    Stiolto  2 puffs each am  Work on inhaler technique:    - remember how the golfers take practice swings Plan B = Backup (to supplement plan A, not to replace it) Only use your albuterol inhaler as a rescue medication Plan C = Crisis  (instead of Plan B but only if Plan B stops working) - only use your albuterol nebulizer if you first try Plan B  Ok to try albuterol 15 min before an activity (on alternating days)  that you know would usually make you short of breath     Please schedule a follow up visit in 3 months but call sooner if needed   12/18/2021 referred for best fit> done 04/29/22 needed 4lpm pulse with walking, rec 5lpm pulse with heavier exertion and ML6 refillable tanks     07/08/2022  f/u ov/[name] [name] re: GOLD 3 but 02 dep maint on stiolto   Chief Complaint  Patient presents with   Follow-up    Breathing is about the same. She denies new co's. She is using her albuterol inhaler 2-3 x per wk on average.   Dyspnea:  any change [employer]/ uses [employer] parking / walks around block/ [place]  Cough: none  Sleeping: flat bed  2 pillows  SABA use: uses before ex - not neb needed  02:  3lpm hs   and none at rest/ up  to 5 lpm with exertion  Rec Plan A = Automatic = Always=    Stiolto 2 puffs each am  Work on inhaler technique:  Plan B = Backup (to supplement plan A, not to replace it) Only use your albuterol inhaler as a rescue medication  Plan C = Crisis (instead of Plan B but only if Plan B stops working) - only use your albuterol nebulizer if you first try Plan B  Ok to try albuterol 15 min before an activity (on alternating days)  that you know would usually make you short of breath Make sure you check your oxygen saturation  AT  your highest level of activity (not after you stop)   to be sure it stays over 90%   10/14/2022  f/u ov/[name] [name] re: GOLD 3/02 dep hs and exercise/  maint on stiolto   No chief complaint on file. Dyspnea:  some walking around block x 10-15 min on 2lpm  Cough: minimal no am flares Sleeping: no noct symptoms, flat bed 2  pillowss  SABA use: not much  02: 3lpm at bedtime, not needed at rest  Rec Work on inhaler technique: Also  Ok to try albuterol 15 min before an activity (on alternating  days)  that you know would usually make you short of breath  Please schedule a follow up visit in 6  months but call sooner if needed - bring your oxygen       04/15/2023  f/u ov/[name] [name] re: GOLD 3/02 dep 24/7    maint on stiolto - did not bring meds   Chief Complaint  Patient presents with   Follow-up    Breathing doing well.  Dyspnea:  around the block on 2lpm/ not chekcing sats  Cough: none Sleeping: flat bed / 2 pillows  SABA use: not often 02: 2lpm 24/7  Lung cancer screening :  in program due 11/2023  Rec No change in medication The key is to stop smoking completely before smoking completely stops you! Keep appt for your lung scan    03/02/2024  12 m f/u ov/[name] [name] re: GOLD 3/ 02 dep maint on stiolto  / poor hfa  Chief Complaint  Patient presents with   Follow-up   Dyspnea:  still walking around the block on 02 but not checking sats  Cough: rattle white worse in am  Sleeping: flat bed with 2 pillows  fine on 02  SABA use: rarely  02: 2lpm 24/7   Lung cancer screening :  RADS 2  on 12/18/2023    No obvious day to day or daytime variability or assoc excess/ purulent sputum or mucus plugs or hemoptysis or cp or chest tightness, subjective wheeze or overt sinus or hb symptoms.    Also denies any obvious fluctuation of symptoms with weather or environmental changes or other aggravating or alleviating factors except as outlined above   No unusual exposure hx or h/o childhood pna/ asthma or knowledge of premature birth.  Current Allergies, Complete Past Medical History, Past Surgical History, Family History, and Social History were reviewed in [employer] record.  ROS  The following are not active complaints unless bolded Hoarseness, sore throat, dysphagia, dental problems, itching, sneezing,  nasal congestion or discharge of excess mucus or purulent secretions, ear ache,   fever, chills, sweats, unintended wt loss or wt gain, classically pleuritic or  exertional cp,  orthopnea pnd or arm/hand swelling  or leg swelling, presyncope, palpitations, abdominal pain, anorexia, nausea, vomiting, diarrhea  or  change in bowel habits or change in bladder habits, change in stools or change in urine, dysuria, hematuria,  rash, arthralgias, visual complaints, headache, numbness, weakness or ataxia or problems with walking or coordination,  change in mood or  memory.        Current Meds  Medication Sig   acetaminophen (TYLENOL) 325 MG tablet Take 325-650 mg by mouth every 6 (six) hours as needed (pain).   albuterol (PROVENTIL) (2.5 MG/3ML) 0.083% nebulizer solution Take 3 mLs (2.5 mg total) by nebulization every 6 (six) hours as needed for wheezing or shortness of breath.   albuterol (VENTOLIN HFA) 108 (90 Base) MCG/ACT inhaler INHALE 2 PUFFS INTO THE LUNGS EVERY 6 HOURS AS NEEDED FOR WHEEZING OR SHORTNESS OF BREATH   amLODipine (NORVASC) 5 MG tablet Take 5 mg by mouth every morning.   buPROPion (WELLBUTRIN XL) 150 MG 24 hr tablet Take 1 tablet (150 mg total) by mouth daily.   colchicine 0.6 MG tablet Take 1 tablet (0.6 mg total) by mouth daily. Take 2 tablets today as soon as you pick up the prescription.  And then 1 more tablet tonight before bed.  If your pain is persisting when you return tomorrow morning continue to take 1 tablet in the morning and 1 at night until your pain resolves. (Patient taking differently: Take 0.6 mg by mouth daily. Take 2 tablets today as soon as you pick up the prescription.  And then 1 more tablet tonight before bed.  If your pain is persisting when you return tomorrow morning continue to take 1 tablet in the morning and 1 at night until your pain resolves.)   FLUoxetine (PROZAC) 10 MG capsule TAKE 1 CAPSULE(10 MG) BY MOUTH DAILY   HYDROcodone-acetaminophen (NORCO) 5-325 MG tablet Take 1 tablet by mouth every 6 (six) hours as needed.   Multiple Vitamin (MULTIVITAMIN WITH MINERALS) TABS tablet Take 1 tablet by mouth every morning.    oxyCODONE-acetaminophen (PERCOCET/ROXICET) 5-325 MG tablet Take 1 tablet by mouth every 6 (six) hours as needed for severe pain.   OXYGEN Inhale 5 L into the lungs continuous.   Tiotropium Bromide-Olodaterol (STIOLTO RESPIMAT) 2.5-2.5 MCG/ACT AERS INHALE 2 PUFFS INTO THE LUNGS DAILY   traZODone (DESYREL) 50 MG tablet Take 1 tablet (50 mg total) by mouth at bedtime.                  Objective:     Wts  03/02/2024        116   04/15/2023       127  10/14/2022     130  07/08/2022       131  03/28/2022       134  12/18/2021         133  09/10/2021      136  09/26/2020     132 05/25/2020       131  06/10/18 129 lb 9.6 oz (58.8 kg)  03/17/18 133 lb 12.8 oz (60.7 kg)  01/08/18 138 lb (62.6 kg)    Vital signs reviewed  03/02/2024  - Note at rest 02 sats  83% on RA and 99% on 2lpm    General appearance:    somewhat frail and depressed appearing eldelry wf nad   HEENT :  Oropharynx  clear   Nasal turbinates nl    NECK :  without JVD/Nodes/TM/ nl carotid upstrokes bilaterally   LUNGS: no acc muscle use,  Mod barrel  contour chest wall with bilateral  Distant  bs s audible wheeze and  without cough on insp or exp maneuvers and mod  Hyperresonant  to  percussion bilaterally     CV:  RRR  no s3 or murmur or increase in P2, and no edema   ABD:  soft and nontender with pos mid insp Hoover's  in the supine position. No bruits or organomegaly appreciated, bowel sounds nl  MS:   Ext warm without deformities or   obvious joint restrictions , calf tenderness, cyanosis or clubbing  SKIN: warm and dry without lesions    NEURO:  alert, approp, nl sensorium with  no motor or cerebellar deficits apparent.                  I personally reviewed images and agree with radiology impression as follows:   Chest LDSCT   12/18/23 1. Lung-RADS 2, benign appearance or behavior. Continue annual screening with low-dose chest CT without contrast in 12 months. 2.  Aortic atherosclerosis (ICD10-I70.0). 3.   Emphysema (ICD10-J43.9).         Assessment

## 2024-03-01 NOTE — Progress Notes (Unsigned)
 BH MD/PA/NP OP Progress Note  03/04/2024 9:23 AM [name] [name]  MRN:  [id number]  Visit Diagnosis:    ICD-10-CM   1. GAD (generalized anxiety disorder)  F41.1 traZODone (DESYREL) 50 MG tablet    FLUoxetine (PROZAC) 20 MG capsule    2. Moderate episode of recurrent major depressive disorder (HCC)  F33.1 traZODone (DESYREL) 50 MG tablet    FLUoxetine (PROZAC) 20 MG capsule       Assessment: [name] [name] is a 63 y.o. female with a history of MDD, COPD who presented to [facility] for initial evaluation on 03/12/2023.    At initial evaluation patient reported symptoms of low mood, anhedonia, amotivation, poor sleep, weight loss, some negative self thoughts, and passive SI.  She denied any intent or plan to act on it.  [name] [name] also endorsed significant symptoms of anxiety including constant worry that she is unable to control, fear of something awful happening, restlessness, difficulty relaxing, and racing thoughts.  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  Patient met criteria for MDD and GAD.  [name] [name] presents for follow-up evaluation. Today, 03/04/24, patient had reached out in the interim about feeling shaky after the medication increase.  On discussion with provider she had attributed to Prozac and decrease the dose to 10 mg after discussion.  On presentation today however patient reports that she did not decrease the Prozac and instead discontinue the Wellbutrin.  After discontinuing the Wellbutrin the shaky feeling improved.  Discussed this with the patient and agreed that Wellbutrin was the most likely contributor.  Furthermore it would make sense to stay off of this medication moving forward.  Her mood symptoms have improved with the titration of the Prozac and patient has not noted any adverse  side effects.  Insomnia has also been improving with the increased trazodone.  As she denies adverse side effects we will titrate the trazodone to 75 mg at bedtime.  Risk and benefits were reviewed.  Patient will follow up in 2 months.  Plan: - Discontinue Wellbutrin Xl 150 mg (shakiness0 - Continue Prozac 20 mg QD - Increase Trazodone to 75 mg QHS - Can consider therapy referral in the future - Crisis resources reviewed - Follow up in 2 months  Chief Complaint:  Chief Complaint  Patient presents with   Follow-up   HPI: [name] [name] had reached out in the interim reporting feeling "shaky and out of it" with more trouble sleeping at night.  This was reviewed and activation of Prozac was discussed.  Patient had opted to decrease the Prozac dose back to 10 mg at that time while continuing the Wellbutrin and trazodone as prescribed.  On presentation today [name] [name] reports that she is not doing well.  She had just gotten out of the hospital for acute pancreatitis where they also found a cyst on her ovary.  Now she has an upcoming appointment to evaluate the cyst.  While this has caused some increased stress in her life she reports that mood wise things are actually fairly stable.  Her insomnia has improved with the titration of trazodone and she feels that the Prozac increase has been beneficial for her previously reported depressive symptoms.  While she is sleeping 6 to 7 hours a night patient was interested in further improvement.  We discussed this with her and the risk  of further titration of trazodone.  Ultimately agreed to increase the dose to 75 mg at bedtime.  Of note patient did not decrease the Prozac to 10 mg as discussed with Dr. Josephina Shih in the interim.  Instead she discontinued the Wellbutrin.  Upon doing this patient reported that the shakiness had improved and has not been an issue since.  We discussed the side effects and the risk of the medications as well as its dosing.  Explained to patient  that 150 mg is the lowest extended release dosing available.  That being the case it would make sense to stay off of this medication moving forward.  She was agreeable with this plan.  Patient reports that she is supposed to return to work on April 4 however is unsure if she will be able to do so.  This is more depending on what will be needed for her physical ailments.  Past Psychiatric History: Patient had connected with outpatient psychiatrist in the late 1990s named Dr. Laureen Ochs at the [facility].  She followed with him for about 5 years before discontinuing.  She believes she was prescribed trazodone at that time and discontinued it after finishing with him.  Patient's PCP had tried bupropion standard release twice daily. We switched to XL though discontinued due to patient losing weight. On retrial she experienced   Currently on Prozac, and trazodone  Patient has significant history of past substance use primarily cocaine and marijuana occurred around 20 to 30 years ago.  She has discontinued that since.  Currently she endorses alcohol use around 1-2 times a week and nicotine use.  She denies any other substance use.  Past Medical History:  Past Medical History:  Diagnosis Date   Anemia    Anxiety    Arthritis    OSTEOARTHRITIS   Blood dyscrasia    EASY BLEEDER   Colon polyps    COPD (chronic obstructive pulmonary disease) (HCC)    Depression    Osteoporosis    Seasonal allergies    Sickle cell trait (HCC)    Uterine fibroid     Past Surgical History:  Procedure Laterality Date   CESAREAN SECTION  1980   COLONOSCOPY WITH PROPOFOL N/A 09/26/2023   Procedure: COLONOSCOPY WITH PROPOFOL;  Surgeon: Jeani Hawking, MD;  Location: [facility];  Service: Gastroenterology;  Laterality: N/A;   DILATATION & CURETTAGE/HYSTEROSCOPY WITH MYOSURE N/A 09/30/2017   Procedure: DILATATION & CURETTAGE/HYSTEROSCOPY WITH MYOSURE MYOMECTOMY;  Surgeon: Geryl Rankins, MD;  Location: [facility];   Service: Gynecology;  Laterality: N/A;  PMB   HEMOSTASIS CLIP PLACEMENT  09/26/2023   Procedure: HEMOSTASIS CLIP PLACEMENT;  Surgeon: Jeani Hawking, MD;  Location: [facility];  Service: Gastroenterology;;   POLYPECTOMY  09/26/2023   Procedure: POLYPECTOMY;  Surgeon: Jeani Hawking, MD;  Location: [facility];  Service: Gastroenterology;;   Family History:  Family History  Problem Relation Age of Onset   Stomach cancer Father 81   Throat cancer Brother    Heart disease Mother    Breast cancer Neg Hx     Social History:  Social History   Socioeconomic History   Marital status: Single    Spouse name: Not on file   Number of children: 1   Years of education: Not on file   Highest education level: Not on file  Occupational History   Not on file  Tobacco Use   Smoking status: Some Days    Current packs/day: 0.50    Average packs/day: 0.5 packs/day for  42.0 years (21.0 ttl pk-yrs)    Types: Cigarettes   Smokeless tobacco: Never   Tobacco comments:    Smoking about 10 per day.  Trying to quit.  03/02/2024 hfb RN  Vaping Use   Vaping status: Never Used  Substance and Sexual Activity   Alcohol use: Yes    Alcohol/week: 0.0 standard drinks of alcohol    Comment: occasional   Drug use: No   Sexual activity: Not on file  Other Topics Concern   Not on file  Social History Narrative   ** Merged History Encounter **       Social Drivers of Corporate investment banker Strain: Not on file  Food Insecurity: Not on file  Transportation Needs: Not on file  Physical Activity: Not on file  Stress: Not on file  Social Connections: Not on file    Allergies:  Allergies  Allergen Reactions   Clobetasol Emul Foam W-Moistcr     Sore throat, trouble breathing, swallowing,feeling sleepy, more hungry, passing urine more often,and weight loss    Current Medications: Current Outpatient Medications  Medication Sig Dispense Refill   acetaminophen (TYLENOL) 325 MG tablet Take 325-650  mg by mouth every 6 (six) hours as needed (pain).     albuterol (PROVENTIL) (2.5 MG/3ML) 0.083% nebulizer solution Take 3 mLs (2.5 mg total) by nebulization every 6 (six) hours as needed for wheezing or shortness of breath. 360 mL 12   albuterol (VENTOLIN HFA) 108 (90 Base) MCG/ACT inhaler INHALE 2 PUFFS INTO THE LUNGS EVERY 6 HOURS AS NEEDED FOR WHEEZING OR SHORTNESS OF BREATH 6.7 g 3   amLODipine (NORVASC) 5 MG tablet Take 5 mg by mouth every morning.     buPROPion (WELLBUTRIN XL) 150 MG 24 hr tablet Take 1 tablet (150 mg total) by mouth daily. 90 tablet 0   colchicine 0.6 MG tablet Take 1 tablet (0.6 mg total) by mouth daily. Take 2 tablets today as soon as you pick up the prescription.  And then 1 more tablet tonight before bed.  If your pain is persisting when you return tomorrow morning continue to take 1 tablet in the morning and 1 at night until your pain resolves. (Patient taking differently: Take 0.6 mg by mouth daily. Take 2 tablets today as soon as you pick up the prescription.  And then 1 more tablet tonight before bed.  If your pain is persisting when you return tomorrow morning continue to take 1 tablet in the morning and 1 at night until your pain resolves.) 14 tablet 0   FLUoxetine (PROZAC) 20 MG capsule Take 1 capsule (20 mg total) by mouth daily. 30 capsule 2   HYDROcodone-acetaminophen (NORCO/VICODIN) 5-325 MG tablet Take 1 tablet by mouth every 6 (six) hours as needed for severe pain (pain score 7-10). 10 tablet 0   Multiple Vitamin (MULTIVITAMIN WITH MINERALS) TABS tablet Take 1 tablet by mouth every morning.     ondansetron (ZOFRAN-ODT) 4 MG disintegrating tablet Take 1 tablet (4 mg total) by mouth every 8 (eight) hours as needed for nausea or vomiting. 20 tablet 0   oxyCODONE-acetaminophen (PERCOCET/ROXICET) 5-325 MG tablet Take 1 tablet by mouth every 6 (six) hours as needed for severe pain. 10 tablet 0   OXYGEN Inhale 5 L into the lungs continuous.     Tiotropium  Bromide-Olodaterol (STIOLTO RESPIMAT) 2.5-2.5 MCG/ACT AERS Inhale 2 puffs into the lungs daily. 4 g 11   traZODone (DESYREL) 50 MG tablet Take 1.5 tablets (75 mg total)  by mouth at bedtime. 135 tablet 0   No current facility-administered medications for this visit.     Psychiatric Specialty Exam: Review of Systems  There were no vitals taken for this visit.There is no height or weight on file to calculate BMI.  General Appearance: Fairly Groomed and oxygen tube on  Eye Contact:  Good  Speech:  Clear and Coherent  Volume:  Normal  Mood:  Euthymic  Affect:  Congruent  Thought Process:  Coherent  Orientation:  Full (Time, Place, and Person)  Thought Content: Logical   Suicidal Thoughts:  No  Homicidal Thoughts:  No  Memory:  Immediate;   Fair  Judgement:  Fair  Insight:  Fair  Psychomotor Activity:  Normal  Concentration:  Concentration: Fair  Recall:  [employer] of Knowledge: Fair  Language: Good  Akathisia:  NA    AIMS (if indicated): not done  Assets:  Communication Skills Desire for Improvement Financial Resources/Insurance  ADL's:  Intact  Cognition: WNL  Sleep:  Good   Metabolic Disorder Labs: No results found for: "HGBA1C", "MPG" No results found for: "PROLACTIN" No results found for: "CHOL", "TRIG", "HDL", "CHOLHDL", "VLDL", "LDLCALC" Lab Results  Component Value Date   TSH 0.707 08/26/2023    Therapeutic Level Labs: No results found for: "LITHIUM" No results found for: "VALPROATE" No results found for: "CBMZ"   Screenings: GAD-7    Flowsheet Row [facility]12/20[facility]  Total GAD-7 Score 12      PHQ2-9    Flowsheet Row Office Visit from 03/12/2023 in [facility]  PHQ-2 Total Score 2  PHQ-9 Total Score 10      Flowsheet Row ED from 03/03/2024 in [facility] Emergency Department at [facility] Admission (Discharged) from 09/26/2023 in  [facility] ED from 05/19/2023 in [facility] Emergency Department at [facility]  C-SSRS RISK CATEGORY No Risk No Risk No Risk       Collaboration of Care: Collaboration of Care: Medication Management AEB medication prescription  Patient/Guardian was advised Release of Information must be obtained prior to any record release in order to collaborate their care with an outside provider. Patient/Guardian was advised if they have not already done so to contact the registration department to sign all necessary forms in order for [place] to release information regarding their care.   Consent: Patient/Guardian gives verbal consent for treatment and assignment of benefits for services provided during this visit. Patient/Guardian expressed understanding and agreed to proceed.    Stasia Cavalier, MD 03/04/2024, 9:23 AM   Virtual Visit via Video Note  I connected with [name] [name] on 03/04/24 at  9:00 AM EDT by a video enabled telemedicine application and verified that I am speaking with the correct person using two identifiers.  Location: Patient: Home Provider: Home Office   I discussed the limitations of evaluation and management by telemedicine and the availability of in person appointments. The patient expressed understanding and agreed to proceed.   I discussed the assessment and treatment plan with the patient. The patient was provided an opportunity to ask questions and all were answered. The patient agreed with the plan and demonstrated an understanding of the instructions.   The patient was advised to call back or seek an in-person evaluation if the symptoms worsen or if the condition fails to improve as anticipated.  I provided 15 minutes of non-face-to-face time during this encounter.   Nida Boatman  Ned Grace, MD

## 2024-03-02 ENCOUNTER — Ambulatory Visit (INDEPENDENT_AMBULATORY_CARE_PROVIDER_SITE_OTHER): Payer: Medicaid Other | Admitting: Internal Medicine

## 2024-03-02 ENCOUNTER — Encounter: Payer: Self-pay | Admitting: Internal Medicine

## 2024-03-02 VITALS — BP 114/70 | HR 85 | Temp 98.4°F | Ht 64.0 in | Wt 116.0 lb

## 2024-03-02 DIAGNOSIS — J9611 Chronic respiratory failure with hypoxia: Secondary | ICD-10-CM | POA: Diagnosis not present

## 2024-03-02 DIAGNOSIS — J439 Emphysema, unspecified: Secondary | ICD-10-CM | POA: Diagnosis not present

## 2024-03-02 DIAGNOSIS — F17210 Nicotine dependence, cigarettes, uncomplicated: Secondary | ICD-10-CM | POA: Diagnosis not present

## 2024-03-02 DIAGNOSIS — Z9981 Dependence on supplemental oxygen: Secondary | ICD-10-CM | POA: Diagnosis not present

## 2024-03-02 DIAGNOSIS — J449 Chronic obstructive pulmonary disease, unspecified: Secondary | ICD-10-CM

## 2024-03-02 MED ORDER — STIOLTO RESPIMAT 2.5-2.5 MCG/ACT IN AERS: 2.0000 | INHALATION_SPRAY | Freq: Every day | RESPIRATORY_TRACT | 11 refills | Status: DC

## 2024-03-02 NOTE — Patient Instructions (Signed)
 No change in medications  Work on inhaler technique:  relax and gently blow all the way out then take a nice smooth full deep breath back in, triggering the inhaler at same time you start breathing in.  Hold breath in for at least  5 seconds if you can.     The key is to stop smoking completely before smoking completely stops you!  Make sure you check your oxygen saturation  AT  your highest level of activity (not after you stop)   to be sure it stays over 90% and adjust  02 flow upward to maintain this level if needed but remember to turn it back to previous settings when you stop (to conserve your supply).   Please schedule a follow up visit in 12  months but call sooner if needed

## 2024-03-03 ENCOUNTER — Other Ambulatory Visit: Payer: Self-pay

## 2024-03-03 ENCOUNTER — Emergency Department (HOSPITAL_COMMUNITY)
Admission: EM | Admit: 2024-03-03 | Discharge: 2024-03-03 | Disposition: A | Discharge status: Home / Self Care | Attending: Emergency Medicine | Admitting: Emergency Medicine

## 2024-03-03 ENCOUNTER — Emergency Department (HOSPITAL_COMMUNITY)

## 2024-03-03 ENCOUNTER — Encounter (HOSPITAL_COMMUNITY): Payer: Self-pay | Admitting: Emergency Medicine

## 2024-03-03 DIAGNOSIS — N898 Other specified noninflammatory disorders of vagina: Secondary | ICD-10-CM | POA: Diagnosis not present

## 2024-03-03 DIAGNOSIS — D573 Sickle-cell trait: Secondary | ICD-10-CM | POA: Insufficient documentation

## 2024-03-03 DIAGNOSIS — K859 Acute pancreatitis without necrosis or infection, unspecified: Secondary | ICD-10-CM | POA: Insufficient documentation

## 2024-03-03 DIAGNOSIS — J449 Chronic obstructive pulmonary disease, unspecified: Secondary | ICD-10-CM | POA: Insufficient documentation

## 2024-03-03 DIAGNOSIS — K589 Irritable bowel syndrome without diarrhea: Secondary | ICD-10-CM | POA: Insufficient documentation

## 2024-03-03 DIAGNOSIS — R197 Diarrhea, unspecified: Secondary | ICD-10-CM | POA: Insufficient documentation

## 2024-03-03 DIAGNOSIS — I700 Atherosclerosis of aorta: Secondary | ICD-10-CM | POA: Diagnosis not present

## 2024-03-03 DIAGNOSIS — R748 Abnormal levels of other serum enzymes: Secondary | ICD-10-CM | POA: Insufficient documentation

## 2024-03-03 DIAGNOSIS — R1013 Epigastric pain: Secondary | ICD-10-CM | POA: Insufficient documentation

## 2024-03-03 DIAGNOSIS — K5730 Diverticulosis of large intestine without perforation or abscess without bleeding: Secondary | ICD-10-CM | POA: Insufficient documentation

## 2024-03-03 DIAGNOSIS — N9489 Other specified conditions associated with female genital organs and menstrual cycle: Secondary | ICD-10-CM | POA: Diagnosis not present

## 2024-03-03 DIAGNOSIS — R112 Nausea with vomiting, unspecified: Secondary | ICD-10-CM | POA: Insufficient documentation

## 2024-03-03 DIAGNOSIS — D72829 Elevated white blood cell count, unspecified: Secondary | ICD-10-CM | POA: Diagnosis not present

## 2024-03-03 DIAGNOSIS — Z79899 Other long term (current) drug therapy: Secondary | ICD-10-CM | POA: Insufficient documentation

## 2024-03-03 LAB — CBC
HCT: 40.1 % (ref 36.0–46.0)
Hemoglobin: 14.2 g/dL (ref 12.0–15.0)
MCH: 30.9 pg (ref 26.0–34.0)
MCHC: 35.4 g/dL (ref 30.0–36.0)
MCV: 87.4 fL (ref 80.0–100.0)
Platelets: 225 10*3/uL (ref 150–400)
RBC: 4.59 MIL/uL (ref 3.87–5.11)
RDW: 13.2 % (ref 11.5–15.5)
WBC: 13.6 10*3/uL — ABNORMAL HIGH (ref 4.0–10.5)
nRBC: 0 % (ref 0.0–0.2)

## 2024-03-03 LAB — LIPASE, BLOOD: Lipase: 1810 U/L — ABNORMAL HIGH (ref 11–51)

## 2024-03-03 LAB — COMPREHENSIVE METABOLIC PANEL
ALT: 43 U/L (ref 0–44)
AST: 40 U/L (ref 15–41)
Albumin: 3.8 g/dL (ref 3.5–5.0)
Alkaline Phosphatase: 63 U/L (ref 38–126)
Anion gap: 13 (ref 5–15)
BUN: 6 mg/dL — ABNORMAL LOW (ref 8–23)
CO2: 22 mmol/L (ref 22–32)
Calcium: 9.4 mg/dL (ref 8.9–10.3)
Chloride: 104 mmol/L (ref 98–111)
Creatinine, Ser: 0.57 mg/dL (ref 0.44–1.00)
GFR, Estimated: >60 mL/min (ref >60)
Glucose, Bld: 118 mg/dL — ABNORMAL HIGH (ref 70–99)
Potassium: 3.2 mmol/L — ABNORMAL LOW (ref 3.5–5.1)
Sodium: 139 mmol/L (ref 135–145)
Total Bilirubin: 1.1 mg/dL (ref 0.0–1.2)
Total Protein: 7.1 g/dL (ref 6.5–8.1)

## 2024-03-03 LAB — RESP PANEL BY RT-PCR (RSV, FLU A&B, COVID)  RVPGX2
Influenza A by PCR: NEGATIVE
Influenza B by PCR: NEGATIVE
Resp Syncytial Virus by PCR: NEGATIVE
SARS Coronavirus 2 by RT PCR: NEGATIVE

## 2024-03-03 MED ORDER — POTASSIUM CHLORIDE CRYS ER 20 MEQ PO TBCR
40.0000 meq | EXTENDED_RELEASE_TABLET | Freq: Once | ORAL | Status: AC
  Administered 2024-03-03: 40 meq via ORAL
  Filled 2024-03-03: qty 2

## 2024-03-03 MED ORDER — HYDROMORPHONE HCL 1 MG/ML IJ SOLN
0.5000 mg | Freq: Once | INTRAMUSCULAR | Status: AC
  Administered 2024-03-03: 0.5 mg via INTRAVENOUS
  Filled 2024-03-03: qty 1

## 2024-03-03 MED ORDER — ONDANSETRON HCL 4 MG/2ML IJ SOLN
4.0000 mg | Freq: Once | INTRAMUSCULAR | Status: AC
  Administered 2024-03-03: 4 mg via INTRAVENOUS
  Filled 2024-03-03: qty 2

## 2024-03-03 MED ORDER — ONDANSETRON 4 MG PO TBDP
4.0000 mg | ORAL_TABLET | Freq: Once | ORAL | Status: AC | PRN
  Administered 2024-03-03: 4 mg via ORAL
  Filled 2024-03-03: qty 1

## 2024-03-03 MED ORDER — SODIUM CHLORIDE 0.9 % IV BOLUS
1000.0000 mL | Freq: Once | INTRAVENOUS | Status: AC
  Administered 2024-03-03: 1000 mL via INTRAVENOUS

## 2024-03-03 MED ORDER — IOHEXOL 350 MG/ML SOLN
75.0000 mL | Freq: Once | INTRAVENOUS | Status: AC | PRN
  Administered 2024-03-03: 75 mL via INTRAVENOUS

## 2024-03-03 MED ORDER — HYDROCODONE-ACETAMINOPHEN 5-325 MG PO TABS
1.0000 | ORAL_TABLET | Freq: Once | ORAL | Status: AC
  Administered 2024-03-03: 1 via ORAL
  Filled 2024-03-03: qty 1

## 2024-03-03 MED ORDER — ONDANSETRON 4 MG PO TBDP
4.0000 mg | ORAL_TABLET | Freq: Three times a day (TID) | ORAL | 0 refills | Status: DC | PRN
Start: 2024-03-03 — End: 2024-04-08

## 2024-03-03 MED ORDER — HYDROCODONE-ACETAMINOPHEN 5-325 MG PO TABS: 1.0000 | ORAL_TABLET | Freq: Four times a day (QID) | ORAL | 0 refills | Status: DC | PRN

## 2024-03-03 NOTE — Assessment & Plan Note (Addendum)
 On hs since  09/26/2020  - as of 09/10/2021 3lpm hs - amb 02 study 09/10/2021 :  Required 4lpm cont to keep sats > 90% with 86% on room air  - 12/18/2021 referred for best fit> done 04/29/22 needed 4lpm pulse with walking, rec 5lpm pulse with heavier exertion and ML6 refillable tanks   Again advised: Make sure you check your oxygen saturation  AT  your highest level of activity (not after you stop)   to be sure it stays over 90% and adjust  02 flow upward to maintain this level if needed but remember to turn it back to previous settings when you stop (to conserve your supply).        F/u q  12 m sooner prn  Each maintenance medication was reviewed in detail including emphasizing most importantly the difference between maintenance and prns and under what circumstances the prns are to be triggered using an action plan format where appropriate.  Total time for H and P, chart review, counseling, reviewing smi/hfa/ 02 /pulse ox  device(s) and generating customized AVS unique to this office visit / same day charting = 20 min

## 2024-03-03 NOTE — Discharge Instructions (Addendum)
 You have pancreatitis.  It is important to have clear liquid diet for the next 1 to 2 days.  When you restart eating try small snacks like crackers or applesauce and gradually progress over the next 2 to 3 days.  You should make sure you are staying well-hydrated with primarily water however you can alternate Pedialyte or Gatorade drink.  It is important that you do not drink alcohol.  If your pain gets worse and you are no longer tolerating oral intake please return to the emergency room.  Otherwise follow-up with your primary care.  CT scan also shows left adnexal lesion you need to follow up with pelvic ultrasound.  Please follow-up outpatient to have this. You need to call to schedule this appointment.

## 2024-03-03 NOTE — ED Triage Notes (Signed)
 Pt endorses upper abd pain, back pain, hot sweats, n/v since last night.

## 2024-03-03 NOTE — ED Provider Notes (Signed)
  Physical Exam  BP (!) 144/98   Pulse 85   Temp 98.6 F (37 C)   Resp 19   Ht 5\' 4"  (1.626 m)   Wt 50.8 kg   SpO2 100%   BMI 19.22 kg/m   Physical Exam Constitutional:      Appearance: She is well-developed. She is obese.  Cardiovascular:     Rate and Rhythm: Normal rate and regular rhythm.  Pulmonary:     Effort: Pulmonary effort is normal.     Breath sounds: Normal breath sounds.  Abdominal:     General: Abdomen is flat. There is no distension.     Palpations: There is no mass.     Tenderness: There is abdominal tenderness. There is no right CVA tenderness, left CVA tenderness or guarding.     Comments: Mild epigastric tenderness  Neurological:     Mental Status: She is alert.     Procedures  Procedures  ED Course / MDM   Clinical Course as of 03/03/24 1854  Wed Mar 03, 2024  1628 Discussed imaging and labs. Patient hemodynamically stable. Reports she is feeling better. Will PO challenge and reassess.  [JB]  1659 Passed PO challenge.  [JB]    Clinical Course User Index [JB] Yajayra Feldt, Horald Chestnut, PA-C   Medical Decision Making Amount and/or Complexity of Data Reviewed Labs: ordered. Radiology: ordered.  Risk Prescription drug management.    Patient received in sign out from prior ED PA. In short, no history of pancreatitis, here for abdominal pain n/v on and off 2 year, but consistent epigastric pain for the last 2 days. She has had a hard time tolerating eating and drinking.   She is hemodynamically stable and well-appearing.  She has mild leukocytosis.  Her lipase is 1,810. Plan is pending CT abd/pelvis. If has reassuring CT scan is tolerating oral intake and pain is well-controlled plan to discharge home.  Dispo pending Ct abd/pelvis -- CT shows acute pancreatitis without any necrosis or peripancreatic collection.  It also shows adnexal lesion.    I discussed imaging and lab results with patient who expressed understanding.  She reports her pain has been  well-controlled and she would like to be discharged home. No episodes of further vomiting reporting, passed PO challenge. I feel it would be reasonable for her to go home and follow-up outpatient.  Patient understands management of pancreatitis. Will get pelvic US as well.  Reports her son is planning to pick her up and help her. Stable for discharge.      Reinaldo Raddle 03/03/24 1859    Benjiman Core, MD 03/04/24 1447

## 2024-03-03 NOTE — Assessment & Plan Note (Signed)

## 2024-03-03 NOTE — Assessment & Plan Note (Signed)
 Active smoker  - PFT's  04/08/2006  FEV1 1.32 (51 % ) ratio 0.53  p 8 % improvement from saba p ? prior to study with   A  FV curve classic moderate concavity   06/10/2018  After extensive coaching inhaler device  effectiveness =   50% from a baseline of 0   - 05/25/2020  After extensive coaching inhaler device,  effectiveness =    75% (poor insp flow, short Ti) > try stiolto 2 puffs each am  > preferred spiriva 1.25/ serevent  - PFT's  09/26/2020  FEV1 0.79 (38 % ) ratio 0.49  p 0 % improvement from saba p servent/spiriva 1.25 (very last dose in device) prior to study with DLCO  7.67 (38%) corrects to 2.84 (66%)  for alv volume and FV curve classically concave exp loop  - 03/02/2024  After extensive coaching inhaler device,  effectiveness =    60% hfa and SMI   Still smoking but unable to inhale her meds effectively with a very neg attitude today so limite of what we can offere here:   Rec; Noecahnge rx  = stiolto and prn saba

## 2024-03-03 NOTE — ED Notes (Signed)
 Pt AxOx4. Denies any worsening symptoms. States nausea has improved. Informed patient that we are awaiting a room assignment in the ED for further evaluation.

## 2024-03-03 NOTE — ED Provider Notes (Signed)
 [place] EMERGENCY DEPARTMENT AT [facility] Provider Note   CSN: [id number] Arrival date & time: 03/03/24  5284     History  Chief Complaint  Patient presents with   Abdominal Pain    [name] [name] is a 63 y.o. female with medical history of anemia, arthritis, blood dyscrasia, COPD, sickle cell trait, uterine fibroid, IBS.  Patient presents to ED for evaluation of abdominal pain.  Patient reports that for the last 2 years she has had off-and-on abdominal pain.  States in the last 2 days the pain is acutely worsened and become more consistent.  Reports pain is located throughout her epigastric region, radiates to her back.  States that she has gone to the bathroom on herself, had diarrhea, twice in the last 2 days.  States that all of a sudden she will just need to use the bathroom and is unable to the bathroom in time before she defecates.  She denies any pain in her low back.  Denies any distal weakness or groin numbness.  Denies dysuria, fevers, flank pain, chest pain, shortness of breath.  Denies blood in stool.  States that she drinks maybe 1 alcoholic beverage twice a week but does not excessively use alcohol.  Denies a history of pancreatitis.  Reports remote history of seeing GI but she has not [place] why.   Abdominal Pain Associated symptoms: diarrhea, nausea and vomiting   Associated symptoms: no chest pain, no dysuria, no fever and no shortness of breath        Home Medications Prior to Admission medications   Medication Sig Start Date End Date Taking? Authorizing Provider  acetaminophen (TYLENOL) 325 MG tablet Take 325-650 mg by mouth every 6 (six) hours as needed (pain).    [provider]  albuterol (PROVENTIL) (2.5 MG/3ML) 0.083% nebulizer solution Take 3 mLs (2.5 mg total) by nebulization every 6 (six) hours as needed for wheezing or shortness of breath. 02/06/16   Lupita Leash, MD  albuterol (VENTOLIN HFA) 108 (90 Base) MCG/ACT inhaler INHALE  2 PUFFS INTO THE LUNGS EVERY 6 HOURS AS NEEDED FOR WHEEZING OR SHORTNESS OF BREATH 12/24/23   [name], MD  amLODipine (NORVASC) 5 MG tablet Take 5 mg by mouth every morning. 05/11/20   [provider]  buPROPion (WELLBUTRIN XL) 150 MG 24 hr tablet Take 1 tablet (150 mg total) by mouth daily. 12/30/23   Stasia Cavalier, MD  colchicine 0.6 MG tablet Take 1 tablet (0.6 mg total) by mouth daily. Take 2 tablets today as soon as you pick up the prescription.  And then 1 more tablet tonight before bed.  If your pain is persisting when you return tomorrow morning continue to take 1 tablet in the morning and 1 at night until your pain resolves. Patient taking differently: Take 0.6 mg by mouth daily. Take 2 tablets today as soon as you pick up the prescription.  And then 1 more tablet tonight before bed.  If your pain is persisting when you return tomorrow morning continue to take 1 tablet in the morning and 1 at night until your pain resolves. 09/10/20   Sponseller, Lupe Carney R, PA-C  FLUoxetine (PROZAC) 10 MG capsule TAKE 1 CAPSULE(10 MG) BY MOUTH DAILY 01/08/24   Bahraini, Sarah A  HYDROcodone-acetaminophen (NORCO) 5-325 MG tablet Take 1 tablet by mouth every 6 (six) hours as needed. 05/23/23   Tarry Kos, MD  Multiple Vitamin (MULTIVITAMIN WITH MINERALS) TABS tablet Take 1 tablet by  mouth every morning.    [provider]  oxyCODONE-acetaminophen (PERCOCET/ROXICET) 5-325 MG tablet Take 1 tablet by mouth every 6 (six) hours as needed for severe pain. 05/19/23   Horton, Mayer Masker, MD  OXYGEN Inhale 5 L into the lungs continuous.    [provider]  Tiotropium Bromide-Olodaterol (STIOLTO RESPIMAT) 2.5-2.5 MCG/ACT AERS Inhale 2 puffs into the lungs daily. 03/02/24   [name], MD  traZODone (DESYREL) 50 MG tablet Take 1 tablet (50 mg total) by mouth at bedtime. 12/30/23   Stasia Cavalier, MD      Allergies    Clobetasol emul foam w-moistcr    Review of Systems   Review of  Systems  Constitutional:  Negative for fever.  Respiratory:  Negative for shortness of breath.   Cardiovascular:  Negative for chest pain.  Gastrointestinal:  Positive for abdominal pain, diarrhea, nausea and vomiting.  Genitourinary:  Negative for dysuria.  All other systems reviewed and are negative.   Physical Exam Updated Vital Signs BP (!) 144/98   Pulse 85   Temp 98.6 F (37 C)   Resp 19   Ht 5\' 4"  (1.626 m)   Wt 50.8 kg   SpO2 100%   BMI 19.22 kg/m  Physical Exam Vitals and nursing note reviewed.  Constitutional:      General: She is not in acute distress.    Appearance: She is well-developed.  HENT:     Head: Normocephalic and atraumatic.  Eyes:     Conjunctiva/sclera: Conjunctivae normal.  Cardiovascular:     Rate and Rhythm: Normal rate and regular rhythm.     Heart sounds: No murmur heard. Pulmonary:     Effort: Pulmonary effort is normal. No respiratory distress.     Breath sounds: Normal breath sounds.  Abdominal:     Palpations: Abdomen is soft.     Tenderness: There is abdominal tenderness in the epigastric area.  Musculoskeletal:        General: No swelling.     Cervical back: Neck supple.  Skin:    General: Skin is warm and dry.     Capillary Refill: Capillary refill takes less than 2 seconds.  Neurological:     Mental Status: She is alert and oriented to person, place, and time.  Psychiatric:        Mood and Affect: Mood normal.     ED Results / Procedures / Treatments   Labs (all labs ordered are listed, but only abnormal results are displayed) Labs Reviewed  LIPASE, BLOOD - Abnormal; Notable for the following components:      Result Value   Lipase 1,810 (*)    All other components within normal limits  COMPREHENSIVE METABOLIC PANEL - Abnormal; Notable for the following components:   Potassium 3.2 (*)    Glucose, Bld 118 (*)    BUN 6 (*)    All other components within normal limits  CBC - Abnormal; Notable for the following  components:   WBC 13.6 (*)    All other components within normal limits  RESP PANEL BY RT-PCR (RSV, FLU A&B, COVID)  RVPGX2  URINALYSIS, ROUTINE W REFLEX MICROSCOPIC    EKG EKG Interpretation Date/Time:  Wednesday March 03 2024 10:59:48 EDT Ventricular Rate:  86 PR Interval:  150 QRS Duration:  84 QT Interval:  368 QTC Calculation: 440 R Axis:   68  Text Interpretation: Normal sinus rhythm Septal infarct , age undetermined Abnormal ECG When compared with ECG of 03-Mar-2024 07:19,  PREVIOUS ECG IS PRESENT Confirmed by Virgina Norfolk [id number]) on 03/03/2024 12:35:03 PM  Radiology No results found.  Procedures Procedures   Medications Ordered in ED Medications  potassium chloride SA (KLOR-CON M) CR tablet 40 mEq (has no administration in time range)  ondansetron (ZOFRAN-ODT) disintegrating tablet 4 mg (4 mg Oral Given 03/03/24 0719)  ondansetron (ZOFRAN) injection 4 mg (4 mg Intravenous Given 03/03/24 1337)  sodium chloride 0.9 % bolus 1,000 mL (1,000 mLs Intravenous New Bag/Given 03/03/24 1341)  HYDROmorphone (DILAUDID) injection 0.5 mg (0.5 mg Intravenous Given 03/03/24 1337)  iohexol (OMNIPAQUE) 350 MG/ML injection 75 mL (75 mLs Intravenous Contrast Given 03/03/24 1426)    ED Course/ Medical Decision Making/ A&P  Medical Decision Making Amount and/or Complexity of Data Reviewed Labs: ordered. Radiology: ordered.  Risk Prescription drug management.   63 year old presents for evaluation.  Please see HPI for further details.  On exam patient is afebrile and nontachycardic.  Lung sounds are clear bilaterally, not hypoxic.  Abdomen has tenderness in the epigastric region.  Neuroexam at baseline.  Patient had labs collected in triage.  Labs include CBC, CMP, lipase and urinalysis.  Patient CBC has a white count of 13.6, baseline hemoglobin.  Metabolic panel with potassium 3.2 repleted with 40 mEq oral potassium, no other electrolyte derangement, no elevated LFTs.  Patient lipase  is elevated 1810.  Troponin negative for all.  Urinalysis pending.  Patient pain treated with 0.5 mg Dilaudid.  Also given 1 L fluid informed grams of Zofran for nausea.  CT abdomen pelvis pending at this time.   Signed out to [employer]. Will follow up on imaging.    Final Clinical Impression(s) / ED Diagnoses Final diagnoses:  Generalized abdominal pain    Rx / DC Orders ED Discharge Orders     None         Al Decant, PA-C 03/03/24 1504    Virgina Norfolk, DO 03/03/24 1506

## 2024-03-04 ENCOUNTER — Telehealth (HOSPITAL_COMMUNITY): Payer: 59 | Admitting: Psychiatry

## 2024-03-04 ENCOUNTER — Encounter (HOSPITAL_COMMUNITY): Payer: Self-pay | Admitting: Psychiatry

## 2024-03-04 DIAGNOSIS — F331 Major depressive disorder, recurrent, moderate: Secondary | ICD-10-CM | POA: Diagnosis not present

## 2024-03-04 DIAGNOSIS — F411 Generalized anxiety disorder: Secondary | ICD-10-CM

## 2024-03-04 MED ORDER — FLUOXETINE HCL 20 MG PO CAPS: 20.0000 mg | ORAL_CAPSULE | Freq: Every day | ORAL | 2 refills | Status: DC

## 2024-03-04 MED ORDER — TRAZODONE HCL 50 MG PO TABS: 75.0000 mg | ORAL_TABLET | Freq: Every day | ORAL | 0 refills | Status: DC

## 2024-03-05 ENCOUNTER — Encounter: Payer: Self-pay | Admitting: Internal Medicine

## 2024-03-05 ENCOUNTER — Other Ambulatory Visit: Payer: Self-pay | Admitting: Internal Medicine

## 2024-03-05 DIAGNOSIS — N907 Vulvar cyst: Secondary | ICD-10-CM

## 2024-03-12 ENCOUNTER — Ambulatory Visit
Admission: RE | Admit: 2024-03-12 | Discharge: 2024-03-12 | Disposition: A | Discharge status: Home / Self Care | Source: Ambulatory Visit | Attending: Internal Medicine | Admitting: Internal Medicine

## 2024-03-12 DIAGNOSIS — N907 Vulvar cyst: Secondary | ICD-10-CM

## 2024-03-15 ENCOUNTER — Encounter: Admitting: Podiatry

## 2024-03-15 NOTE — Progress Notes (Signed)
Patient did not show for scheduled appointment today.

## 2024-03-27 NOTE — Progress Notes (Signed)
 ok

## 2024-04-05 ENCOUNTER — Ambulatory Visit
Admission: RE | Admit: 2024-04-05 | Discharge: 2024-04-05 | Disposition: A | Discharge status: Home / Self Care | Source: Ambulatory Visit | Attending: Internal Medicine | Admitting: Internal Medicine

## 2024-04-05 DIAGNOSIS — N6310 Unspecified lump in the right breast, unspecified quadrant: Secondary | ICD-10-CM

## 2024-04-06 ENCOUNTER — Ambulatory Visit (INDEPENDENT_AMBULATORY_CARE_PROVIDER_SITE_OTHER): Admitting: Podiatry

## 2024-04-06 ENCOUNTER — Encounter: Payer: Self-pay | Admitting: Podiatry

## 2024-04-06 DIAGNOSIS — M79675 Pain in left toe(s): Secondary | ICD-10-CM | POA: Diagnosis not present

## 2024-04-06 DIAGNOSIS — L84 Corns and callosities: Secondary | ICD-10-CM

## 2024-04-06 DIAGNOSIS — B351 Tinea unguium: Secondary | ICD-10-CM | POA: Diagnosis not present

## 2024-04-06 DIAGNOSIS — M79674 Pain in right toe(s): Secondary | ICD-10-CM

## 2024-04-06 NOTE — Progress Notes (Unsigned)
 Subjective:  Patient ID: [name] [name], female    DOB: [birth year],  MRN: [id number]   [name] [name] presents to clinic today for:  Chief Complaint  Patient presents with   Callouses    new pt-possible callus of L foot (ABN signed)   Patient notes nails are thick, discolored, elongated and painful in shoegear when trying to ambulate.  Her primary reason for making today's appointment is painful callus on the bottom of her feet.  The left is worse than the right.  She notes that she would like to get on a regular schedule for nail care.  PCP is Edda Goo, MD.  Past Medical History:  Diagnosis Date   Anemia    Anxiety    Arthritis    OSTEOARTHRITIS   Blood dyscrasia    EASY BLEEDER   Colon polyps    COPD (chronic obstructive pulmonary disease) (HCC)    Depression    Osteoporosis    Seasonal allergies    Sickle cell trait (HCC)    Uterine fibroid     Past Surgical History:  Procedure Laterality Date   CESAREAN SECTION  1980   COLONOSCOPY WITH PROPOFOL  N/A 09/26/2023   Procedure: COLONOSCOPY WITH PROPOFOL ;  Surgeon: Alvis Jourdain, MD;  Location: [facility];  Service: Gastroenterology;  Laterality: N/A;   DILATATION & CURETTAGE/HYSTEROSCOPY WITH MYOSURE N/A 09/30/2017   Procedure: DILATATION & CURETTAGE/HYSTEROSCOPY WITH MYOSURE MYOMECTOMY;  Surgeon: Johnn Najjar, MD;  Location: [facility];  Service: Gynecology;  Laterality: N/A;  PMB   HEMOSTASIS CLIP PLACEMENT  09/26/2023   Procedure: HEMOSTASIS CLIP PLACEMENT;  Surgeon: Alvis Jourdain, MD;  Location: [facility];  Service: Gastroenterology;;   POLYPECTOMY  09/26/2023   Procedure: POLYPECTOMY;  Surgeon: Alvis Jourdain, MD;  Location: [facility];  Service: Gastroenterology;;    Allergies  Allergen Reactions   Clobetasol  Emul Foam W-Moistcr     Sore throat, trouble breathing, swallowing,feeling sleepy, more hungry, passing urine more often,and weight loss    Review of Systems: Negative except as noted in the  HPI.  Objective:  [name] [name] is a pleasant 63 y.o. female in NAD. AAO x 3.  Vascular Examination: Capillary refill time is 3-5 seconds to toes bilateral. Palpable pedal pulses b/l LE. Digital hair present b/l.  Skin temperature gradient WNL b/l. No varicosities b/l. No cyanosis noted b/l.   Dermatological Examination: Pedal skin with normal turgor, texture and tone b/l. No open wounds. No interdigital macerations b/l. Toenails x10 are 3mm thick, discolored, dystrophic with subungual debris. There is pain with compression of the nail plates.  They are elongated x10.  There are hyperkeratotic lesions bilateral submet 5.  The left callus is more painful on palpation than the right.  No ulceration is noted  Assessment/Plan: 1. Callus of foot   2. Pain due to onychomycosis of toenails of both feet    The mycotic toenails were sharply debrided x10 with sterile nail nippers and a power debriding burr to decrease bulk/thickness and length.    The medical assistant reviewed and insurance ABN/waiver for the callus care prior to service being rendered.  She was informed that this may not be deemed medically necessary by her health insurance companies.  The calluses were then shaved with a sterile #[id number] blade x 2, bilateral submet 5  Discussed prescription ciclopirox for the toenail fungus.  She would like to hold off on the treatment at this time.  She can call the office if she changes  her mind or would like to proceed with this treatment prior to her next appointment.  Return in about 3 months (around 07/06/2024) for RFC.   Joe Murders, DPM, FACFAS [facility]     [address] [place], [place] [address]                Office [phone]  Fax [phone]

## 2024-04-08 ENCOUNTER — Ambulatory Visit: Admitting: Obstetrics and Gynecology

## 2024-04-08 ENCOUNTER — Other Ambulatory Visit (HOSPITAL_COMMUNITY)
Admission: RE | Admit: 2024-04-08 | Discharge: 2024-04-08 | Disposition: A | Discharge status: Home / Self Care | Source: Ambulatory Visit | Attending: Obstetrics and Gynecology | Admitting: Obstetrics and Gynecology

## 2024-04-08 ENCOUNTER — Encounter: Payer: Self-pay | Admitting: Obstetrics and Gynecology

## 2024-04-08 VITALS — BP 100/62 | HR 88 | Ht 64.0 in | Wt 110.0 lb

## 2024-04-08 DIAGNOSIS — R350 Frequency of micturition: Secondary | ICD-10-CM

## 2024-04-08 DIAGNOSIS — R634 Abnormal weight loss: Secondary | ICD-10-CM | POA: Diagnosis not present

## 2024-04-08 DIAGNOSIS — N9489 Other specified conditions associated with female genital organs and menstrual cycle: Secondary | ICD-10-CM | POA: Insufficient documentation

## 2024-04-08 DIAGNOSIS — N898 Other specified noninflammatory disorders of vagina: Secondary | ICD-10-CM

## 2024-04-08 DIAGNOSIS — R1900 Intra-abdominal and pelvic swelling, mass and lump, unspecified site: Secondary | ICD-10-CM | POA: Diagnosis not present

## 2024-04-08 DIAGNOSIS — Z1151 Encounter for screening for human papillomavirus (HPV): Secondary | ICD-10-CM | POA: Diagnosis not present

## 2024-04-08 DIAGNOSIS — Z124 Encounter for screening for malignant neoplasm of cervix: Secondary | ICD-10-CM

## 2024-04-08 DIAGNOSIS — Z01419 Encounter for gynecological examination (general) (routine) without abnormal findings: Secondary | ICD-10-CM | POA: Diagnosis present

## 2024-04-08 DIAGNOSIS — N3281 Overactive bladder: Secondary | ICD-10-CM

## 2024-04-08 LAB — URINALYSIS, COMPLETE W/RFL CULTURE
Bacteria, UA: NONE SEEN /HPF
Bilirubin Urine: NEGATIVE
Casts: NONE SEEN /LPF
Crystals: NONE SEEN /HPF
Glucose, UA: NEGATIVE
Hgb urine dipstick: NEGATIVE
Ketones, ur: NEGATIVE
Leukocyte Esterase: NEGATIVE
Nitrites, Initial: NEGATIVE
Protein, ur: NEGATIVE
RBC / HPF: NONE SEEN /HPF (ref 0–2)
Specific Gravity, Urine: 1.01 (ref 1.001–1.035)
WBC, UA: NONE SEEN /HPF (ref 0–5)
Yeast: NONE SEEN /HPF
pH: 5.5 (ref 5.0–8.0)

## 2024-04-08 LAB — NO CULTURE INDICATED

## 2024-04-08 LAB — WET PREP FOR TRICH, YEAST, CLUE

## 2024-04-08 MED ORDER — TROSPIUM CHLORIDE 20 MG PO TABS: 20.0000 mg | ORAL_TABLET | Freq: Two times a day (BID) | ORAL | 3 refills | Status: DC

## 2024-04-08 NOTE — Assessment & Plan Note (Addendum)
 Stable 3 cm left adnexal mass since 2017. Chronic vaginal cyst since 2008. Suggesting benign process. Normal pelvic exam, asymptomatic. We will complete tumor markers today Recommend follow-up ultrasound in 6 months

## 2024-04-08 NOTE — Progress Notes (Signed)
 63 y.o. [id number] female with tobacco use here for referral for uterine mass, vaginal cyst, and weight loss. Single.  Admitted to hospital 03/03/24 for acute pancreatitis. Ct scan showed left pelvic mass and chronic vaginal cyst. Notes 20lb wt loss over past year.  Vaginal odor, urinary frequency. No PMB or pelvic pain.  03/03/24: IMPRESSION: 1. Acute pancreatitis. No peripancreatic collection or necrosis. 2. Mild pancreatic ductal dilatation of 4 mm, nonspecific. 3. Colonic diverticulosis without diverticulitis. 4. Questionable but not definite soft tissue lesion in the left adnexa measuring 3.5 cm. Recommend further evaluation with pelvic ultrasound. 5. Chronic vaginal cyst which is increased from remote 2008 exam.  03/12/24 TVUS: IMPRESSION: 1. Redemonstrated, bilobed soft tissue mass in the left adnexa measuring 3.8 x 2 x 3.1 cm. Overall, given the relative stability since 2017, this is likely benign. 2. Similar appearance of a vaginal cyst measuring 2.8 x 2 x 2.3 cm.  3. Anterior uterine body fibroid, measuring 1.5 x 1.4 x 1.5 cm. No endometrial thickening.  Chart review reveals 11/06/16 CT ab pelvis with  3.3 cm in the left pelvis interposed between the colon and the left adnexa. Left pelvic mass has been stable over 73yr.  GYN HISTORY: Prior C-section  OB History  Gravida Para Term Preterm AB Living  1 1 1  0 0 1  SAB IAB Ectopic Multiple Live Births  0 0 0 0 0    # Outcome Date GA Lbr Len/2nd Weight Sex Type Anes PTL Lv  1 Term             Past Medical History:  Diagnosis Date   Anemia    Anxiety    Arthritis    OSTEOARTHRITIS   Blood dyscrasia    EASY BLEEDER   Colon polyps    COPD (chronic obstructive pulmonary disease) (HCC)    Depression    Osteoporosis    Seasonal allergies    Sickle cell trait (HCC)    Uterine fibroid     Past Surgical History:  Procedure Laterality Date   CESAREAN SECTION  1980   COLONOSCOPY WITH PROPOFOL  N/A 09/26/2023    Procedure: COLONOSCOPY WITH PROPOFOL ;  Surgeon: Alvis Jourdain, MD;  Location: [facility];  Service: Gastroenterology;  Laterality: N/A;   DILATATION & CURETTAGE/HYSTEROSCOPY WITH MYOSURE N/A 09/30/2017   Procedure: DILATATION & CURETTAGE/HYSTEROSCOPY WITH MYOSURE MYOMECTOMY;  Surgeon: Johnn Najjar, MD;  Location: [facility];  Service: Gynecology;  Laterality: N/A;  PMB   HEMOSTASIS CLIP PLACEMENT  09/26/2023   Procedure: HEMOSTASIS CLIP PLACEMENT;  Surgeon: Alvis Jourdain, MD;  Location: [facility];  Service: Gastroenterology;;   POLYPECTOMY  09/26/2023   Procedure: POLYPECTOMY;  Surgeon: Alvis Jourdain, MD;  Location: [facility];  Service: Gastroenterology;;    Current Outpatient Medications on File Prior to Visit  Medication Sig Dispense Refill   acetaminophen  (TYLENOL ) 325 MG tablet Take 325-650 mg by mouth every 6 (six) hours as needed (pain).     albuterol  (PROVENTIL ) (2.5 MG/3ML) 0.083% nebulizer solution Take 3 mLs (2.5 mg total) by nebulization every 6 (six) hours as needed for wheezing or shortness of breath. 360 mL 12   albuterol  (VENTOLIN  HFA) 108 (90 Base) MCG/ACT inhaler INHALE 2 PUFFS INTO THE LUNGS EVERY 6 HOURS AS NEEDED FOR WHEEZING OR SHORTNESS OF BREATH 6.7 g 3   amLODipine  (NORVASC ) 5 MG tablet Take 5 mg by mouth every morning.     FLUoxetine  (PROZAC ) 20 MG capsule Take 1 capsule (20 mg total) by mouth daily. 30 capsule 2  HYDROcodone -acetaminophen  (NORCO/VICODIN) 5-325 MG tablet Take 1 tablet by mouth every 6 (six) hours as needed for severe pain (pain score 7-10). 10 tablet 0   Multiple Vitamin (MULTIVITAMIN WITH MINERALS) TABS tablet Take 1 tablet by mouth every morning.     OXYGEN  Inhale 5 L into the lungs continuous.     traZODone  (DESYREL ) 50 MG tablet Take 1.5 tablets (75 mg total) by mouth at bedtime. 135 tablet 0   No current facility-administered medications on file prior to visit.    Allergies  Allergen Reactions   Clobetasol  Emul Foam W-Moistcr     Sore  throat, trouble breathing, swallowing,feeling sleepy, more hungry, passing urine more often,and weight loss      PE Today's Vitals   04/08/24 1401  BP: 100/62  Pulse: 88  SpO2: 94%  Weight: 110 lb (49.9 kg)  Height: 5\' 4"  (1.626 m)   Body mass index is 18.88 kg/m.  Physical Exam Vitals reviewed. Exam conducted with a chaperone present.  Constitutional:      General: She is not in acute distress.    Appearance: Normal appearance.  HENT:     Head: Normocephalic and atraumatic.     Nose: Nose normal.  Eyes:     Extraocular Movements: Extraocular movements intact.     Conjunctiva/sclera: Conjunctivae normal.  Pulmonary:     Effort: Pulmonary effort is normal.  Genitourinary:    General: Normal vulva.     Exam position: Lithotomy position.     Vagina: Normal. No vaginal discharge.     Cervix: Normal. No cervical motion tenderness, discharge or lesion.     Uterus: Normal. Not enlarged and not tender.      Adnexa: Right adnexa normal and left adnexa normal.     Comments: Weak kegel Musculoskeletal:        General: Normal range of motion.     Cervical back: Normal range of motion.  Neurological:     General: No focal deficit present.     Mental Status: She is alert.  Psychiatric:        Mood and Affect: Mood normal.        Behavior: Behavior normal.       Assessment and Plan:        Pelvic mass Assessment & Plan: Stable 3 cm left adnexal mass since 2017. Chronic vaginal cyst since 2008. Suggesting benign process. Normal pelvic exam, asymptomatic. We will complete tumor markers today Recommend follow-up ultrasound in 6 months  Orders: -     CA 125 -     Cancer antigen 19-9 -     CEA -     US  PELVIS TRANSVAGINAL NON-OB (TV ONLY); Future  Vaginal odor -     WET PREP FOR TRICH, YEAST, CLUE  Urinary frequency -     Urinalysis,Complete w/RFL Culture  Weight loss Recommend follow-up with PCP -     Hepatitis panel, acute -     HIV Antibody (routine testing w  rflx)  Cervical cancer screening -     Cytology - PAP  OAB (overactive bladder) Assessment & Plan: Patient with OAB symptoms. Education on cause provided. Discussed lifestyle modifications for management, including avoidance of fluid prior to bed and bladder irritants. Also discussion PFPT and medical managements (including use of myrbetriq, gemtesa (okay with HTN), trospium , oxybutynin). Patient desires medical therapy.  Trospium  XR 60mg  qQM is the preferred anticholinergic for OAB, as cognitive side effects have not been demonstrated with this medication.  Alternatively patients can  take trospium  IR 20 mg twice daily on empty stomach. Side effects include dry mouth, dry eyes, and constipation. Should be avoided in patients with severe hepatic impairment, severe renal impairment (CrCl <30) cognitive impairment, and glaucoma   Orders: -     Trospium  Chloride; Take 1 tablet (20 mg total) by mouth 2 (two) times daily.  Dispense: 180 tablet; Refill: 3  Other orders -     REFLEXIVE URINE CULTURE   Romaine Closs, MD

## 2024-04-08 NOTE — Assessment & Plan Note (Signed)
 Patient with OAB symptoms. Education on cause provided. Discussed lifestyle modifications for management, including avoidance of fluid prior to bed and bladder irritants. Also discussion PFPT and medical managements (including use of myrbetriq, gemtesa (okay with HTN), trospium , oxybutynin). Patient desires medical therapy.  Trospium  XR 60mg  qQM is the preferred anticholinergic for OAB, as cognitive side effects have not been demonstrated with this medication.  Alternatively patients can take trospium  IR 20 mg twice daily on empty stomach. Side effects include dry mouth, dry eyes, and constipation. Should be avoided in patients with severe hepatic impairment, severe renal impairment (CrCl <30) cognitive impairment, and glaucoma

## 2024-04-10 ENCOUNTER — Other Ambulatory Visit: Payer: Self-pay | Admitting: Internal Medicine

## 2024-04-10 LAB — CEA: CEA: <2 ng/mL

## 2024-04-10 LAB — HIV ANTIBODY (ROUTINE TESTING W REFLEX): HIV 1&2 Ab, 4th Generation: NONREACTIVE

## 2024-04-10 LAB — CANCER ANTIGEN 19-9: CA 19-9: 9 U/mL (ref <34)

## 2024-04-10 LAB — HEPATITIS PANEL, ACUTE
Hep A IgM: NONREACTIVE
Hep B C IgM: NONREACTIVE
Hepatitis B Surface Ag: NONREACTIVE
Hepatitis C Ab: NONREACTIVE

## 2024-04-10 LAB — CA 125: CA 125: 9 U/mL (ref <35)

## 2024-04-14 ENCOUNTER — Encounter: Payer: Self-pay | Admitting: Obstetrics and Gynecology

## 2024-04-14 LAB — CYTOLOGY - PAP
Comment: NEGATIVE
Diagnosis: NEGATIVE
High risk HPV: NEGATIVE

## 2024-04-16 ENCOUNTER — Telehealth: Payer: Self-pay

## 2024-04-16 DIAGNOSIS — N3281 Overactive bladder: Secondary | ICD-10-CM

## 2024-04-16 NOTE — Telephone Encounter (Signed)
 Pt notified and voiced understanding. Will stop taking meds now and wait for further recommendations from [facility].

## 2024-04-16 NOTE — Telephone Encounter (Signed)
 Pt LVM in triage line stating that since she was prescribed the medication from [facility], she has been vomiting every morning. Inquiring if she should d/c taking or if she should continue taking in hopes that the N&V will cease.   Per EMR investigation: pt was prescribed generic [place]ura  on 04/08/2024.   Please advise.

## 2024-04-16 NOTE — Telephone Encounter (Signed)
 Patient stated that she has been taking the medication on an empty stomach. She states the only time she vomits is after she takes it & no other times. She said today she has had some pains that feels like it is in her uterus. She said she felt it right after she vomited. Patient is aware to discontinue taking the pill for now. Routing to Dr. Andrena Ke for review

## 2024-04-16 NOTE — Telephone Encounter (Signed)
 Stop medication for now. Please send to [facility] for further management, has admin time this afternoon.

## 2024-04-16 NOTE — Telephone Encounter (Signed)
 Per GH: "Medication should be taken on empty stomach. Is that what she doing?"

## 2024-04-19 NOTE — Progress Notes (Unsigned)
 BH MD/PA/NP OP Progress Note  04/22/2024 9:20 AM [name] [name]  MRN:  [id number]  Visit Diagnosis:    ICD-10-CM   1. GAD (generalized anxiety disorder)  F41.1 traZODone  (DESYREL ) 50 MG tablet    FLUoxetine  (PROZAC ) 20 MG capsule    2. Moderate episode of recurrent major depressive disorder (HCC)  F33.1 traZODone  (DESYREL ) 50 MG tablet    FLUoxetine  (PROZAC ) 20 MG capsule    3. Nicotine  dependence, uncomplicated, unspecified nicotine  product type  F17.200 nicotine  (NICODERM CQ  - DOSED IN MG/24 HOURS) 14 mg/24hr patch      Assessment: [name] [name] is a 63 y.o. female with a history of MDD, COPD who presented to [facility] at [facility] for initial evaluation on 03/12/2023.    At initial evaluation patient reported symptoms of low mood, anhedonia, amotivation, poor sleep, weight loss, some negative self thoughts, and passive SI.  She denied any intent or plan to act on it.  [name] [name] also endorsed significant symptoms of anxiety including constant worry that she is unable to control, fear of something awful happening, restlessness, difficulty relaxing, and racing thoughts.  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  Patient met criteria for MDD and GAD.  [name] [name] presents for follow-up evaluation. Today, 04/22/24, patient reports some fluctuation in her mood secondary to psychosocial stressors.  For the most part she is doing well however and her primary concern is about her increased nicotine  use.  Patient started smoking more when she was out after her surgery and has had difficulty quitting since.  Since returning to work it is decreased to half a pack a day.  Patient would like to stop completely.  Discussed Chantix  however patient had been on interested in that today.  She was open to a trial of the nicotine  patch  and we reviewed the risk and benefits of this.  We also went over resources like [place]  quit Line and his insurance does not cover the nicotine  patch.  Patient has tolerated the rest of her regimen well denying any adverse side effects.  We will continue on the Prozac  and trazodone  and follow up in 2 months.  Plan: - Continue Prozac  20 mg QD - Continue Trazodone  75 mg QHS - Can consider therapy referral in the future - Crisis resources reviewed - Follow up in 2 months  Chief Complaint:  Chief Complaint  Patient presents with   Follow-up   HPI: On presentation today [name] [name] reports that her mood and anxiety are up and down. A lot of stress dealing with her daughter who is in and out of housing. When her daughter is homeless her granddaughter has been staying with her. She wants to be there for her but it is difficult, as she has to take off work to help care for her. The inconsistency in schedule has been difficult.   Another component of the difficulty is that patient had restarted her nicotine  use when she was out of work.  Now she wants to quit again and has found that returning to work helps.  She can go 7 hours during the day without smoking but then smokes half a pack when she gets home.  On the days that she has to take off to care for her granddaughter she smokes more.  Patient is interested in nicotine  replacement therapy.  We  had also discussed Chantix  however patient did not want to retry this.  She had an one successful trial of this in the past but the most recent one was not successful.  Patient reports the remainder of her medications are working well and she sleeps well with the increase in trazodone .  She denies any adverse side effects from the medication.  Past Psychiatric History: Patient had connected with outpatient psychiatrist in the late 1990s named Dr. Arvil Birks at the [facility].  She followed with him for about 5 years before discontinuing.  She believes she  was prescribed trazodone  at that time and discontinued it after finishing with him.  Patient's PCP had tried bupropion  standard release twice daily. We switched to XL though discontinued due to patient losing weight. On retrial she experienced shakiness.  Currently on Prozac , and trazodone   Patient has significant history of past substance use primarily cocaine and marijuana occurred around 20 to 30 years ago.  She has discontinued that since.  Currently she endorses alcohol use around 1-2 times a week and nicotine  use.  She denies any other substance use.  Past Medical History:  Past Medical History:  Diagnosis Date   Anemia    Anxiety    Arthritis    OSTEOARTHRITIS   Blood dyscrasia    EASY BLEEDER   Colon polyps    COPD (chronic obstructive pulmonary disease) (HCC)    Depression    Osteoporosis    Seasonal allergies    Sickle cell trait (HCC)    Uterine fibroid     Past Surgical History:  Procedure Laterality Date   CESAREAN SECTION  1980   COLONOSCOPY WITH PROPOFOL  N/A 09/26/2023   Procedure: COLONOSCOPY WITH PROPOFOL ;  Surgeon: Alvis Jourdain, MD;  Location: [facility];  Service: Gastroenterology;  Laterality: N/A;   DILATATION & CURETTAGE/HYSTEROSCOPY WITH MYOSURE N/A 09/30/2017   Procedure: DILATATION & CURETTAGE/HYSTEROSCOPY WITH MYOSURE MYOMECTOMY;  Surgeon: Johnn Najjar, MD;  Location: [facility];  Service: Gynecology;  Laterality: N/A;  PMB   HEMOSTASIS CLIP PLACEMENT  09/26/2023   Procedure: HEMOSTASIS CLIP PLACEMENT;  Surgeon: Alvis Jourdain, MD;  [facility]: [facility];  Service: Gastroenterology;;   POLYPECTOMY  09/26/2023   Procedure: POLYPECTOMY;  Surgeon: Alvis Jourdain, MD;  [facility];  Service: Gastroenterology;;   Family History:  Family History  Problem Relation Age of Onset   Stomach cancer Father 67   Throat cancer Brother    Heart disease Mother    Breast cancer Neg Hx     Social History:  Social History   Socioeconomic History    Marital status: Single    Spouse name: Not on file   Number of children: 1   Years of education: Not on file   Highest education level: Not on file  Occupational History   Not on file  Tobacco Use   Smoking status: Some Days    Current packs/day: 0.50    Average packs/day: 0.5 packs/day for 42.0 years (21.0 ttl pk-yrs)    Types: Cigarettes   Smokeless tobacco: Never   Tobacco comments:    Smoking about 10 per day.  Trying to quit.  03/02/2024 hfb RN  Vaping Use   Vaping status: Never Used  Substance and Sexual Activity   Alcohol use: Yes    Alcohol/week: 0.0 standard drinks of alcohol    Comment: occasional   Drug use: No   Sexual activity: Not Currently  Other Topics Concern   Not on file  Social History Narrative   **  Merged History Encounter **       Social Drivers of Corporate investment banker Strain: Not on file  Food Insecurity: Not on file  Transportation Needs: Not on file  Physical Activity: Not on file  Stress: Not on file  Social Connections: Not on file    Allergies:  Allergies  Allergen Reactions   Clobetasol  Emul Foam W-Moistcr     Sore throat, trouble breathing, swallowing,feeling sleepy, more hungry, passing urine more often,and weight loss    Current Medications: Current Outpatient Medications  Medication Sig Dispense Refill   nicotine  (NICODERM CQ  - DOSED IN MG/24 HOURS) 14 mg/24hr patch Place 1 patch (14 mg total) onto the skin daily. 28 patch 2   acetaminophen  (TYLENOL ) 325 MG tablet Take 325-650 mg by mouth every 6 (six) hours as needed (pain).     albuterol  (PROVENTIL ) (2.5 MG/3ML) 0.083% nebulizer solution Take 3 mLs (2.5 mg total) by nebulization every 6 (six) hours as needed for wheezing or shortness of breath. 360 mL 12   albuterol  (VENTOLIN  HFA) 108 (90 Base) MCG/ACT inhaler INHALE 2 PUFFS INTO THE LUNGS EVERY 6 HOURS AS NEEDED FOR WHEEZING OR SHORTNESS OF BREATH 6.7 g 3   amLODipine  (NORVASC ) 5 MG tablet Take 5 mg by mouth every  morning.     FLUoxetine  (PROZAC ) 20 MG capsule Take 1 capsule (20 mg total) by mouth daily. 30 capsule 2   HYDROcodone -acetaminophen  (NORCO/VICODIN) 5-325 MG tablet Take 1 tablet by mouth every 6 (six) hours as needed for severe pain (pain score 7-10). 10 tablet 0   Multiple Vitamin (MULTIVITAMIN WITH MINERALS) TABS tablet Take 1 tablet by mouth every morning.     OXYGEN  Inhale 5 L into the lungs continuous.     STIOLTO RESPIMAT  2.5-2.5 MCG/ACT AERS INHALE 2 PUFFS INTO THE LUNGS DAILY 4 g 11   traZODone  (DESYREL ) 50 MG tablet Take 1.5 tablets (75 mg total) by mouth at bedtime. 135 tablet 0   trospium  (SANCTURA ) 20 MG tablet Take 1 tablet (20 mg total) by mouth 2 (two) times daily. 180 tablet 3   No current facility-administered medications for this visit.     Psychiatric Specialty Exam: Review of Systems  There were no vitals taken for this visit.There is no height or weight on file to calculate BMI.  General Appearance: Fairly Groomed  Eye Contact:  Good  Speech:  Clear and Coherent  Volume:  Normal  Mood:  Euthymic  Affect:  Congruent  Thought Process:  Coherent  Orientation:  Full (Time, Place, and Person)  Thought Content: Logical   Suicidal Thoughts:  No  Homicidal Thoughts:  No  Memory:  Immediate;   Fair  Judgement:  Fair  Insight:  Fair  Psychomotor Activity:  Normal  Concentration:  Concentration: Fair  Recall:  [employer] of Knowledge: Fair  Language: Good  Akathisia:  NA    AIMS (if indicated): not done  Assets:  Communication Skills Desire for Improvement Financial Resources/Insurance  ADL's:  Intact  Cognition: WNL  Sleep:  Good   Metabolic Disorder Labs: No results found for: "HGBA1C", "MPG" No results found for: "PROLACTIN" No results found for: "CHOL", "TRIG", "HDL", "CHOLHDL", "VLDL", "LDLCALC" Lab Results  Component Value Date   TSH 0.707 08/26/2023    Therapeutic Level Labs: No results found for: "LITHIUM" No results found for:  "VALPROATE" No results found for: "CBMZ"   Screenings: GAD-7    Flowsheet Row [facility]12/20[facility] in [facility]  Total GAD-7 Score 12      PHQ2-9    Flowsheet Row Office Visit from 04/08/2024 in [facility] Office Visit from 03/12/2023 in [facility]  PHQ-2 Total Score 0 2  PHQ-9 Total Score -- 10      Flowsheet Row ED from 03/03/2024 in [facility] Emergency Department at [facility] Admission (Discharged) from 09/26/2023 in [facility] ENDOSCOPY ED from 05/19/2023 in [facility] Emergency Department at [facility]  C-SSRS RISK CATEGORY No Risk No Risk No Risk       Collaboration of Care: Collaboration of Care: Medication Management AEB medication prescription and Other provider involved in patient's care AEB Podiatry and OB chart review  Patient/Guardian was advised Release of Information must be obtained prior to any record release in order to collaborate their care with an outside provider. Patient/Guardian was advised if they have not already done so to contact the registration department to sign all necessary forms in order for us  to release information regarding their care.   Consent: Patient/Guardian gives verbal consent for treatment and assignment of benefits for services provided during this visit. Patient/Guardian expressed understanding and agreed to proceed.    Yves Herb, MD 04/22/2024, 9:20 AM   Virtual Visit via Video Note  I connected with [name] [name] on 04/22/24 at  9:00 AM EDT by a video enabled telemedicine application and verified that I am speaking with the correct person using two identifiers.  Location: Patient: Home Provider: Home Office   I discussed the limitations of evaluation and management by telemedicine and the availability of in person appointments. The patient expressed understanding  and agreed to proceed.   I discussed the assessment and treatment plan with the patient. The patient was provided an opportunity to ask questions and all were answered. The patient agreed with the plan and demonstrated an understanding of the instructions.   The patient was advised to call back or seek an in-person evaluation if the symptoms worsen or if the condition fails to improve as anticipated.  I provided 15 minutes of non-face-to-face time during this encounter.   Yves Herb, MD

## 2024-04-19 NOTE — Telephone Encounter (Signed)
 Call placed to patient,left detailed message, ok per dpr. Advised per Dr. Andrena Ke, return call to office to advise how you would like to proceed, [phone], opt 4.

## 2024-04-22 ENCOUNTER — Encounter (HOSPITAL_COMMUNITY): Payer: Self-pay | Admitting: Psychiatry

## 2024-04-22 ENCOUNTER — Telehealth (HOSPITAL_COMMUNITY): Admitting: Psychiatry

## 2024-04-22 DIAGNOSIS — F17210 Nicotine dependence, cigarettes, uncomplicated: Secondary | ICD-10-CM

## 2024-04-22 DIAGNOSIS — F331 Major depressive disorder, recurrent, moderate: Secondary | ICD-10-CM

## 2024-04-22 DIAGNOSIS — F411 Generalized anxiety disorder: Secondary | ICD-10-CM | POA: Diagnosis not present

## 2024-04-22 DIAGNOSIS — F17200 Nicotine dependence, unspecified, uncomplicated: Secondary | ICD-10-CM

## 2024-04-22 MED ORDER — FLUOXETINE HCL 20 MG PO CAPS: 20.0000 mg | ORAL_CAPSULE | Freq: Every day | ORAL | 2 refills | Status: DC

## 2024-04-22 MED ORDER — TRAZODONE HCL 50 MG PO TABS
75.0000 mg | ORAL_TABLET | Freq: Every day | ORAL | 0 refills | Status: DC
Start: 2024-04-22 — End: 2024-06-24

## 2024-04-22 MED ORDER — NICOTINE 14 MG/24HR TD PT24: 14.0000 mg | MEDICATED_PATCH | Freq: Every day | TRANSDERMAL | 2 refills | Status: DC

## 2024-04-26 NOTE — Telephone Encounter (Signed)
 Spoke w/ the pt and she is agreeable to trying the Gemtesa.

## 2024-05-13 MED ORDER — GEMTESA 75 MG PO TABS: 75.0000 mg | ORAL_TABLET | Freq: Every evening | ORAL | 11 refills | Status: AC

## 2024-05-13 NOTE — Telephone Encounter (Signed)
 Rx sent.

## 2024-06-23 NOTE — Progress Notes (Unsigned)
 BH MD/PA/NP OP Progress Note  06/23/2024 2:08 PM [name] [name]  MRN:  [id number]  Visit Diagnosis:  No diagnosis found.   Assessment: [name] [name] is a 63 y.o. female with a history of MDD, COPD who presented to [facility] at [facility] for initial evaluation on 03/12/2023.    At initial evaluation patient reported symptoms of low mood, anhedonia, amotivation, poor sleep, weight loss, some negative self thoughts, and passive SI.  She denied any intent or plan to act on it.  [name] [name] also endorsed significant symptoms of anxiety including constant worry that she is unable to control, fear of something awful happening, restlessness, difficulty relaxing, and racing thoughts.  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  Patient met criteria for MDD and GAD.  [name] [name] presents for follow-up evaluation. Today, 06/23/24, patient reports    some fluctuation in her mood secondary to psychosocial stressors.  For the most part she is doing well however and her primary concern is about her increased nicotine  use.  Patient started smoking more when she was out after her surgery and has had difficulty quitting since.  Since returning to work it is decreased to half a pack a day.  Patient would like to stop completely.  Discussed Chantix  however patient had been on interested in that today.  She was open to a trial of the nicotine  patch and we reviewed the risk and benefits of this.  We also went over resources like [place]  quit Line and his insurance does not cover the nicotine  patch.  Patient has tolerated the rest of her regimen well denying any adverse side effects.  We will continue on the Prozac  and trazodone  and follow up in 2 months.  Plan: - Continue Prozac  20 mg QD - Continue Trazodone  75 mg QHS - Can consider  therapy referral in the future - Crisis resources reviewed - Follow up in 2 months  Chief Complaint:  No chief complaint on file.  HPI: On presentation today [name] [name] reports that    her mood and anxiety are up and down. A lot of stress dealing with her daughter who is in and out of housing. When her daughter is homeless her granddaughter has been staying with her. She wants to be there for her but it is difficult, as she has to take off work to help care for her. The inconsistency in schedule has been difficult.   Another component of the difficulty is that patient had restarted her nicotine  use when she was out of work.  Now she wants to quit again and has found that returning to work helps.  She can go 7 hours during the day without smoking but then smokes half a pack when she gets home.  On the days that she has to take off to care for her granddaughter she smokes more.  Patient is interested in nicotine  replacement therapy.  We had also discussed Chantix  however patient did not want to retry this.  She had an one successful trial of this in the past but the most recent one was not successful.  Patient reports the remainder of her medications are working well and she sleeps well with the increase in trazodone .  She denies any adverse side effects from the medication.  Past Psychiatric History: Patient had connected with outpatient psychiatrist in the late  1990s named Dr. Freya at the [facility].  She followed with him for about 5 years before discontinuing.  She believes she was prescribed trazodone  at that time and discontinued it after finishing with him.  Patient's PCP had tried bupropion  standard release twice daily. We switched to XL though discontinued due to patient losing weight. On retrial she experienced shakiness.  Currently on Prozac , and trazodone   Patient has significant history of past substance use primarily cocaine and marijuana occurred around 20 to 30 years ago.  She  has discontinued that since.  Currently she endorses alcohol use around 1-2 times a week and nicotine  use.  She denies any other substance use.  Past Medical History:  Past Medical History:  Diagnosis Date   Anemia    Anxiety    Arthritis    OSTEOARTHRITIS   Blood dyscrasia    EASY BLEEDER   Colon polyps    COPD (chronic obstructive pulmonary disease) (HCC)    Depression    Osteoporosis    Seasonal allergies    Sickle cell trait (HCC)    Uterine fibroid     Past Surgical History:  Procedure Laterality Date   CESAREAN SECTION  1980   COLONOSCOPY WITH PROPOFOL  N/A 09/26/2023   Procedure: COLONOSCOPY WITH PROPOFOL ;  Surgeon: Rollin Dover, MD;  Location: [facility];  Service: Gastroenterology;  Laterality: N/A;   DILATATION & CURETTAGE/HYSTEROSCOPY WITH MYOSURE N/A 09/30/2017   Procedure: DILATATION & CURETTAGE/HYSTEROSCOPY WITH MYOSURE MYOMECTOMY;  Surgeon: Timmie Norris, MD;  Location: [facility];  Service: Gynecology;  Laterality: N/A;  PMB   HEMOSTASIS CLIP PLACEMENT  09/26/2023   Procedure: HEMOSTASIS CLIP PLACEMENT;  Surgeon: Rollin Dover, MD;  Location: [facility];  Service: Gastroenterology;;   POLYPECTOMY  09/26/2023   Procedure: POLYPECTOMY;  Surgeon: Rollin Dover, MD;  Location: [facility];  Service: Gastroenterology;;   Family History:  Family History  Problem Relation Age of Onset   Stomach cancer Father 38   Throat cancer Brother    Heart disease Mother    Breast cancer Neg Hx     Social History:  Social History   Socioeconomic History   Marital status: Single    Spouse name: Not on file   Number of children: 1   Years of education: Not on file   Highest education level: Not on file  Occupational History   Not on file  Tobacco Use   Smoking status: Some Days    Current packs/day: 0.50    Average packs/day: 0.5 packs/day for 42.0 years (21.0 ttl pk-yrs)    Types: Cigarettes   Smokeless tobacco: Never   Tobacco comments:    Smoking about 10 per  day.  Trying to quit.  03/02/2024 hfb RN  Vaping Use   Vaping status: Never Used  Substance and Sexual Activity   Alcohol use: Yes    Alcohol/week: 0.0 standard drinks of alcohol    Comment: occasional   Drug use: No   Sexual activity: Not Currently  Other Topics Concern   Not on file  Social History Narrative   ** Merged History Encounter **       Social Drivers of Corporate investment banker Strain: Not on file  Food Insecurity: Not on file  Transportation Needs: Not on file  Physical Activity: Not on file  Stress: Not on file  Social Connections: Not on file    Allergies:  Allergies  Allergen Reactions   Clobetasol  Emul Foam W-Moistcr     Sore throat, trouble  breathing, swallowing,feeling sleepy, more hungry, passing urine more often,and weight loss    Current Medications: Current Outpatient Medications  Medication Sig Dispense Refill   acetaminophen  (TYLENOL ) 325 MG tablet Take 325-650 mg by mouth every 6 (six) hours as needed (pain).     albuterol  (PROVENTIL ) (2.5 MG/3ML) 0.083% nebulizer solution Take 3 mLs (2.5 mg total) by nebulization every 6 (six) hours as needed for wheezing or shortness of breath. 360 mL 12   albuterol  (VENTOLIN  HFA) 108 (90 Base) MCG/ACT inhaler INHALE 2 PUFFS INTO THE LUNGS EVERY 6 HOURS AS NEEDED FOR WHEEZING OR SHORTNESS OF BREATH 6.7 g 3   amLODipine  (NORVASC ) 5 MG tablet Take 5 mg by mouth every morning.     FLUoxetine  (PROZAC ) 20 MG capsule Take 1 capsule (20 mg total) by mouth daily. 30 capsule 2   HYDROcodone -acetaminophen  (NORCO/VICODIN) 5-325 MG tablet Take 1 tablet by mouth every 6 (six) hours as needed for severe pain (pain score 7-10). 10 tablet 0   Multiple Vitamin (MULTIVITAMIN WITH MINERALS) TABS tablet Take 1 tablet by mouth every morning.     nicotine  (NICODERM CQ  - DOSED IN MG/24 HOURS) 14 mg/24hr patch Place 1 patch (14 mg total) onto the skin daily. 28 patch 2   OXYGEN  Inhale 5 L into the lungs continuous.     STIOLTO  RESPIMAT 2.5-2.5 MCG/ACT AERS INHALE 2 PUFFS INTO THE LUNGS DAILY 4 g 11   traZODone  (DESYREL ) 50 MG tablet Take 1.5 tablets (75 mg total) by mouth at bedtime. 135 tablet 0   Vibegron  (GEMTESA ) 75 MG TABS Take 1 tablet (75 mg total) by mouth at bedtime. 30 tablet 11   No current facility-administered medications for this visit.     Psychiatric Specialty Exam: Review of Systems  There were no vitals taken for this visit.There is no height or weight on file to calculate BMI.  General Appearance: Fairly Groomed  Eye Contact:  Good  Speech:  Clear and Coherent  Volume:  Normal  Mood:  Euthymic  Affect:  Congruent  Thought Process:  Coherent  Orientation:  Full (Time, Place, and Person)  Thought Content: Logical   Suicidal Thoughts:  No  Homicidal Thoughts:  No  Memory:  Immediate;   Fair  Judgement:  Fair  Insight:  Fair  Psychomotor Activity:  Normal  Concentration:  Concentration: Fair  Recall:  [employer] of Knowledge: Fair  Language: Good  Akathisia:  NA    AIMS (if indicated): not done  Assets:  Communication Skills Desire for Improvement Financial Resources/Insurance  ADL's:  Intact  Cognition: WNL  Sleep:  Good   Metabolic Disorder Labs: No results found for: HGBA1C, MPG No results found for: PROLACTIN No results found for: CHOL, TRIG, HDL, CHOLHDL, VLDL, LDLCALC Lab Results  Component Value Date   TSH 0.707 08/26/2023    Therapeutic Level Labs: No results found for: LITHIUM No results found for: VALPROATE No results found for: CBMZ   Screenings: GAD-7    Flowsheet Row [facility] Visit from [facility]12/20[facility] in [facility]  Total GAD-7 Score 12   PHQ2-9    Flowsheet Row Office Visit from 04/08/2024 in [facility] Office Visit from 03/12/2023 in [facility]  PHQ-2 Total Score 0 2  PHQ-9 Total Score -- 10   Flowsheet Row ED from  03/03/2024 in [facility] Emergency Department at [facility] Admission (Discharged) from 09/26/2023 in [facility] ENDOSCOPY ED from 05/19/2023  in [facility] Emergency Department at [facility]  C-SSRS RISK CATEGORY No Risk No Risk No Risk    Collaboration of Care: Collaboration of Care: Medication Management AEB medication prescription and Other provider involved in patient's care AEB Podiatry and OB chart review  Patient/Guardian was advised Release of Information must be obtained prior to any record release in order to collaborate their care with an outside provider. Patient/Guardian was advised if they have not already done so to contact the registration department to sign all necessary forms in order for us  to release information regarding their care.   Consent: Patient/Guardian gives verbal consent for treatment and assignment of benefits for services provided during this visit. Patient/Guardian expressed understanding and agreed to proceed.    [name] CHRISTELLA Finder, MD 06/23/2024, 2:08 PM   Virtual Visit via Video Note  I connected with [name] [name] on 06/23/24 at 10:00 AM EDT by a video enabled telemedicine application and verified that I am speaking with the correct person using two identifiers.  Location: Patient: Home Provider: Home Office   I discussed the limitations of evaluation and management by telemedicine and the availability of in person appointments. The patient expressed understanding and agreed to proceed.   I discussed the assessment and treatment plan with the patient. The patient was provided an opportunity to ask questions and all were answered. The patient agreed with the plan and demonstrated an understanding of the instructions.   The patient was advised to call back or seek an in-person evaluation if the symptoms worsen or if the condition fails to improve as anticipated.  I provided 15 minutes of non-face-to-face time during this  encounter.   [name] CHRISTELLA Finder, MD

## 2024-06-24 ENCOUNTER — Encounter (HOSPITAL_COMMUNITY): Payer: Self-pay | Admitting: Psychiatry

## 2024-06-24 ENCOUNTER — Telehealth (HOSPITAL_COMMUNITY): Admitting: Psychiatry

## 2024-06-24 DIAGNOSIS — F411 Generalized anxiety disorder: Secondary | ICD-10-CM

## 2024-06-24 DIAGNOSIS — R634 Abnormal weight loss: Secondary | ICD-10-CM

## 2024-06-24 DIAGNOSIS — F331 Major depressive disorder, recurrent, moderate: Secondary | ICD-10-CM

## 2024-06-24 DIAGNOSIS — F17200 Nicotine dependence, unspecified, uncomplicated: Secondary | ICD-10-CM

## 2024-06-24 MED ORDER — FLUOXETINE HCL 20 MG PO CAPS: 20.0000 mg | ORAL_CAPSULE | Freq: Every day | ORAL | 2 refills | Status: DC

## 2024-06-24 MED ORDER — TRAZODONE HCL 50 MG PO TABS: 50.0000 mg | ORAL_TABLET | Freq: Every day | ORAL | 0 refills | Status: DC

## 2024-07-13 ENCOUNTER — Encounter: Admitting: Podiatry

## 2024-07-13 NOTE — Progress Notes (Signed)
Patient did not show for scheduled appointment today.

## 2024-08-18 ENCOUNTER — Inpatient Hospital Stay (HOSPITAL_COMMUNITY)
Admission: EM | Admit: 2024-08-18 | Discharge: 2024-08-21 | DRG: 439 | Disposition: A | Discharge status: Home / Self Care | Attending: Internal Medicine | Admitting: Internal Medicine

## 2024-08-18 ENCOUNTER — Emergency Department (HOSPITAL_COMMUNITY)

## 2024-08-18 ENCOUNTER — Other Ambulatory Visit: Payer: Self-pay

## 2024-08-18 ENCOUNTER — Encounter (HOSPITAL_COMMUNITY): Payer: Self-pay

## 2024-08-18 DIAGNOSIS — K859 Acute pancreatitis without necrosis or infection, unspecified: Secondary | ICD-10-CM | POA: Diagnosis present

## 2024-08-18 DIAGNOSIS — Z8249 Family history of ischemic heart disease and other diseases of the circulatory system: Secondary | ICD-10-CM

## 2024-08-18 DIAGNOSIS — Z888 Allergy status to other drugs, medicaments and biological substances status: Secondary | ICD-10-CM

## 2024-08-18 DIAGNOSIS — R748 Abnormal levels of other serum enzymes: Secondary | ICD-10-CM | POA: Diagnosis present

## 2024-08-18 DIAGNOSIS — D573 Sickle-cell trait: Secondary | ICD-10-CM | POA: Diagnosis present

## 2024-08-18 DIAGNOSIS — F1010 Alcohol abuse, uncomplicated: Secondary | ICD-10-CM | POA: Diagnosis present

## 2024-08-18 DIAGNOSIS — K852 Alcohol induced acute pancreatitis without necrosis or infection: Secondary | ICD-10-CM | POA: Diagnosis not present

## 2024-08-18 DIAGNOSIS — F17210 Nicotine dependence, cigarettes, uncomplicated: Secondary | ICD-10-CM | POA: Diagnosis present

## 2024-08-18 DIAGNOSIS — R7989 Other specified abnormal findings of blood chemistry: Secondary | ICD-10-CM | POA: Insufficient documentation

## 2024-08-18 DIAGNOSIS — K7010 Alcoholic hepatitis without ascites: Secondary | ICD-10-CM | POA: Diagnosis present

## 2024-08-18 DIAGNOSIS — F329 Major depressive disorder, single episode, unspecified: Secondary | ICD-10-CM | POA: Diagnosis present

## 2024-08-18 DIAGNOSIS — J439 Emphysema, unspecified: Secondary | ICD-10-CM | POA: Diagnosis present

## 2024-08-18 DIAGNOSIS — E876 Hypokalemia: Secondary | ICD-10-CM | POA: Diagnosis present

## 2024-08-18 DIAGNOSIS — M810 Age-related osteoporosis without current pathological fracture: Secondary | ICD-10-CM | POA: Diagnosis present

## 2024-08-18 DIAGNOSIS — Z800 Family history of malignant neoplasm of digestive organs: Secondary | ICD-10-CM

## 2024-08-18 DIAGNOSIS — Z79899 Other long term (current) drug therapy: Secondary | ICD-10-CM

## 2024-08-18 DIAGNOSIS — Z860100 Personal history of colon polyps, unspecified: Secondary | ICD-10-CM

## 2024-08-18 DIAGNOSIS — E871 Hypo-osmolality and hyponatremia: Secondary | ICD-10-CM | POA: Diagnosis present

## 2024-08-18 DIAGNOSIS — Z808 Family history of malignant neoplasm of other organs or systems: Secondary | ICD-10-CM

## 2024-08-18 DIAGNOSIS — I10 Essential (primary) hypertension: Secondary | ICD-10-CM | POA: Diagnosis present

## 2024-08-18 DIAGNOSIS — N9489 Other specified conditions associated with female genital organs and menstrual cycle: Secondary | ICD-10-CM | POA: Diagnosis present

## 2024-08-18 DIAGNOSIS — Z23 Encounter for immunization: Secondary | ICD-10-CM

## 2024-08-18 DIAGNOSIS — R7401 Elevation of levels of liver transaminase levels: Secondary | ICD-10-CM | POA: Diagnosis present

## 2024-08-18 DIAGNOSIS — Z9981 Dependence on supplemental oxygen: Secondary | ICD-10-CM

## 2024-08-18 DIAGNOSIS — R739 Hyperglycemia, unspecified: Secondary | ICD-10-CM | POA: Diagnosis present

## 2024-08-18 LAB — CBC WITH DIFFERENTIAL/PLATELET
Abs Immature Granulocytes: 0.06 K/uL (ref 0.00–0.07)
Basophils Absolute: 0 K/uL (ref 0.0–0.1)
Basophils Relative: 0 %
Eosinophils Absolute: 0 K/uL (ref 0.0–0.5)
Eosinophils Relative: 0 %
HCT: 43.7 % (ref 36.0–46.0)
Hemoglobin: 15.2 g/dL — ABNORMAL HIGH (ref 12.0–15.0)
Immature Granulocytes: 1 %
Lymphocytes Relative: 11 %
Lymphs Abs: 1.5 K/uL (ref 0.7–4.0)
MCH: 30.2 pg (ref 26.0–34.0)
MCHC: 34.8 g/dL (ref 30.0–36.0)
MCV: 86.9 fL (ref 80.0–100.0)
Monocytes Absolute: 0.4 K/uL (ref 0.1–1.0)
Monocytes Relative: 3 %
Neutro Abs: 10.9 K/uL — ABNORMAL HIGH (ref 1.7–7.7)
Neutrophils Relative %: 85 %
Platelets: 220 K/uL (ref 150–400)
RBC: 5.03 MIL/uL (ref 3.87–5.11)
RDW: 13.7 % (ref 11.5–15.5)
WBC: 12.9 K/uL — ABNORMAL HIGH (ref 4.0–10.5)
nRBC: 0 % (ref 0.0–0.2)

## 2024-08-18 LAB — COMPREHENSIVE METABOLIC PANEL WITH GFR
ALT: 107 U/L — ABNORMAL HIGH (ref 0–44)
AST: 98 U/L — ABNORMAL HIGH (ref 15–41)
Albumin: 3.9 g/dL (ref 3.5–5.0)
Alkaline Phosphatase: 121 U/L (ref 38–126)
Anion gap: 13 (ref 5–15)
BUN: 5 mg/dL — ABNORMAL LOW (ref 8–23)
CO2: 26 mmol/L (ref 22–32)
Calcium: 8.9 mg/dL (ref 8.9–10.3)
Chloride: 97 mmol/L — ABNORMAL LOW (ref 98–111)
Creatinine, Ser: 0.61 mg/dL (ref 0.44–1.00)
GFR, Estimated: >60 mL/min (ref >60)
Glucose, Bld: 160 mg/dL — ABNORMAL HIGH (ref 70–99)
Potassium: 3.2 mmol/L — ABNORMAL LOW (ref 3.5–5.1)
Sodium: 136 mmol/L (ref 135–145)
Total Bilirubin: 1.7 mg/dL — ABNORMAL HIGH (ref 0.0–1.2)
Total Protein: 7.5 g/dL (ref 6.5–8.1)

## 2024-08-18 LAB — LIPASE, BLOOD: Lipase: 1190 U/L — ABNORMAL HIGH (ref 11–51)

## 2024-08-18 MED ORDER — SODIUM CHLORIDE 0.9 % IV SOLN: INTRAVENOUS | Status: DC | PRN

## 2024-08-18 MED ORDER — IOHEXOL 350 MG/ML SOLN
75.0000 mL | Freq: Once | INTRAVENOUS | Status: AC | PRN
  Administered 2024-08-18: 75 mL via INTRAVENOUS

## 2024-08-18 MED ORDER — ONDANSETRON HCL 4 MG/2ML IJ SOLN
4.0000 mg | Freq: Once | INTRAMUSCULAR | Status: AC
  Administered 2024-08-18: 4 mg via INTRAVENOUS
  Filled 2024-08-18: qty 2

## 2024-08-18 MED ORDER — FENTANYL CITRATE PF 50 MCG/ML IJ SOSY
50.0000 ug | PREFILLED_SYRINGE | Freq: Once | INTRAMUSCULAR | Status: AC
  Administered 2024-08-18: 50 ug via INTRAVENOUS
  Filled 2024-08-18: qty 1

## 2024-08-18 MED ORDER — POTASSIUM CHLORIDE 10 MEQ/100ML IV SOLN
10.0000 meq | Freq: Once | INTRAVENOUS | Status: AC
  Administered 2024-08-18: 10 meq via INTRAVENOUS
  Filled 2024-08-18: qty 100

## 2024-08-18 MED ORDER — SODIUM CHLORIDE 0.9 % IV BOLUS (SEPSIS)
1000.0000 mL | Freq: Once | INTRAVENOUS | Status: AC
  Administered 2024-08-19: 1000 mL via INTRAVENOUS

## 2024-08-18 NOTE — H&P (Signed)
 History and Physical    [name] [name] FMW:[id number] DOB: 06-06-1961 DOA: 08/18/2024  Patient coming from: Home.  Chief Complaint: Abdominal pain.  HPI: [name] [name] is a 63 y.o. female with history of COPD with ongoing tobacco abuse, hypertension presents to the ER with complaints of abdominal pain.  Patient states over the last 3 days she has been having epigastric pain with some nausea vomiting but denies any diarrhea.  Pain radiates to her back.  She does drink beer at least 3 or 4 times a week.  Patient states she has had prior history of pancreatitis.  ED Course: In the ER patient's labs show lipase of 1100 AST 98 ALT 107.  WBC 12.9 total bili 1.7.  CT abdomen pelvis shows features concerning for acute pancreatitis there is no gallstone seen.  Patient started on fluids pain relief medication admit for further observation.  Review of Systems: As per HPI, rest all negative.   Past Medical History:  Diagnosis Date   Anemia    Anxiety    Arthritis    OSTEOARTHRITIS   Blood dyscrasia    EASY BLEEDER   Colon polyps    COPD (chronic obstructive pulmonary disease) (HCC)    Depression    Osteoporosis    Seasonal allergies    Sickle cell trait (HCC)    Uterine fibroid     Past Surgical History:  Procedure Laterality Date   CESAREAN SECTION  1980   COLONOSCOPY WITH PROPOFOL  N/A 09/26/2023   Procedure: COLONOSCOPY WITH PROPOFOL ;  Surgeon: Rollin Dover, MD;  Location: [facility];  Service: Gastroenterology;  Laterality: N/A;   DILATATION & CURETTAGE/HYSTEROSCOPY WITH MYOSURE N/A 09/30/2017   Procedure: DILATATION & CURETTAGE/HYSTEROSCOPY WITH MYOSURE MYOMECTOMY;  Surgeon: Timmie Norris, MD;  Location: [facility];  Service: Gynecology;  Laterality: N/A;  PMB   HEMOSTASIS CLIP PLACEMENT  09/26/2023   Procedure: HEMOSTASIS CLIP PLACEMENT;  Surgeon: Rollin Dover, MD;  Location: [facility];  Service: Gastroenterology;;   POLYPECTOMY  09/26/2023   Procedure: POLYPECTOMY;  Surgeon:  Rollin Dover, MD;  Location: [facility];  Service: Gastroenterology;;     reports that she has been smoking cigarettes. She has a 21 pack-year smoking history. She has never used smokeless tobacco. She reports current alcohol use. She reports that she does not use drugs.  Allergies  Allergen Reactions   Clobetasol  Emul Foam W-Moistcr     Sore throat, trouble breathing, swallowing,feeling sleepy, more hungry, passing urine more often,and weight loss    Family History  Problem Relation Age of Onset   Stomach cancer Father 43   Throat cancer Brother    Heart disease Mother    Breast cancer Neg Hx     Prior to Admission medications   Medication Sig Start Date End Date Taking? Authorizing Provider  acetaminophen  (TYLENOL ) 325 MG tablet Take 325-650 mg by mouth every 6 (six) hours as needed (pain).    [provider]  albuterol  (PROVENTIL ) (2.5 MG/3ML) 0.083% nebulizer solution Take 3 mLs (2.5 mg total) by nebulization every 6 (six) hours as needed for wheezing or shortness of breath. 02/06/16   Alaine Vicenta NOVAK, MD  albuterol  (VENTOLIN  HFA) 108 (90 Base) MCG/ACT inhaler INHALE 2 PUFFS INTO THE LUNGS EVERY 6 HOURS AS NEEDED FOR WHEEZING OR SHORTNESS OF BREATH 12/24/23   Darlean Ozell NOVAK, MD  amLODipine  (NORVASC ) 5 MG tablet Take 5 mg by mouth every morning. 05/11/20   [provider]  FLUoxetine  (PROZAC ) 20 MG capsule Take 1 capsule (20  mg total) by mouth daily. 06/24/24   Carvin Arvella HERO, MD  HYDROcodone -acetaminophen  (NORCO/VICODIN) 5-325 MG tablet Take 1 tablet by mouth every 6 (six) hours as needed for severe pain (pain score 7-10). 03/03/24   Barrett, Jamie N, PA-C  Multiple Vitamin (MULTIVITAMIN WITH MINERALS) TABS tablet Take 1 tablet by mouth every morning.    [provider]  nicotine  (NICODERM CQ  - DOSED IN MG/24 HOURS) 14 mg/24hr patch Place 1 patch (14 mg total) onto the skin daily. 04/22/24   Carvin Arvella HERO, MD  OXYGEN  Inhale 5 L into the lungs continuous.     [provider]  STIOLTO RESPIMAT  2.5-2.5 MCG/ACT AERS INHALE 2 PUFFS INTO THE LUNGS DAILY 04/12/24   Darlean Ozell NOVAK, MD  traZODone  (DESYREL ) 50 MG tablet Take 1 tablet (50 mg total) by mouth at bedtime. 06/24/24   Carvin Arvella HERO, MD  Vibegron  (GEMTESA ) 75 MG TABS Take 1 tablet (75 mg total) by mouth at bedtime. 05/13/24   Dallie Vera GAILS, MD    Physical Exam: Constitutional: Moderately built and nourished. Vitals:   08/18/24 1845 08/18/24 1900 08/18/24 2000  BP:  138/81 (!) 141/91  Pulse:   90  Resp:  17 20  SpO2: 96%  92%   Eyes: Anicteric no pallor. ENMT: No discharge from the ears eyes nose or mouth. Neck: No mass felt.  No neck rigidity. Respiratory: No rhonchi or crepitations. Cardiovascular: S1-S2 heard. Abdomen: Epigastric tenderness no guarding or rigidity. Musculoskeletal: No edema. Skin: No rash. Neurologic: Alert awake oriented time place and person.  Moves all extremities. Psychiatric: Appears normal.  Normal affect.   Labs on Admission: I have personally reviewed following labs and imaging studies  CBC: Recent Labs  Lab 08/18/24 2020  WBC 12.9*  NEUTROABS 10.9*  HGB 15.2*  HCT 43.7  MCV 86.9  PLT 220   Basic Metabolic Panel: Recent Labs  Lab 08/18/24 2020  NA 136  K 3.2*  CL 97*  CO2 26  GLUCOSE 160*  BUN 5*  CREATININE 0.61  CALCIUM 8.9   GFR: CrCl cannot be calculated (Unknown ideal weight.). Liver Function Tests: Recent Labs  Lab 08/18/24 2020  AST 98*  ALT 107*  ALKPHOS 121  BILITOT 1.7*  PROT 7.5  ALBUMIN 3.9   Recent Labs  Lab 08/18/24 2020  LIPASE 1,190*   No results for input(s): AMMONIA in the last 168 hours. Coagulation Profile: No results for input(s): INR, PROTIME in the last 168 hours. Cardiac Enzymes: No results for input(s): CKTOTAL, CKMB, CKMBINDEX, TROPONINI in the last 168 hours. BNP (last 3 results) No results for input(s): PROBNP in the last 8760 hours. HbA1C: No results for  input(s): HGBA1C in the last 72 hours. CBG: No results for input(s): GLUCAP in the last 168 hours. Lipid Profile: No results for input(s): CHOL, HDL, LDLCALC, TRIG, CHOLHDL, LDLDIRECT in the last 72 hours. Thyroid  Function Tests: No results for input(s): TSH, T4TOTAL, FREET4, T3FREE, THYROIDAB in the last 72 hours. Anemia Panel: No results for input(s): VITAMINB12, FOLATE, FERRITIN, TIBC, IRON, RETICCTPCT in the last 72 hours. Urine analysis:    Component Value Date/Time   COLORURINE YELLOW 04/08/2024 1447   APPEARANCEUR CLEAR 04/08/2024 1447   LABSPEC 1.010 04/08/2024 1447   PHURINE 5.5 04/08/2024 1447   GLUCOSEU NEGATIVE 04/08/2024 1447   HGBUR NEGATIVE 04/08/2024 1447   BILIRUBINUR NEGATIVE 10/28/2016 1101   KETONESUR NEGATIVE 04/08/2024 1447   PROTEINUR NEGATIVE 04/08/2024 1447   UROBILINOGEN 0.2 06/07/2014 0854   NITRITE NEGATIVE  10/28/2016 1101   LEUKOCYTESUR NEGATIVE 10/28/2016 1101   Sepsis Labs: @LABRCNTIP (procalcitonin:4,lacticidven:4) )No results found for this or any previous visit (from the past 240 hours).   Radiological Exams on Admission: CT ABDOMEN PELVIS W CONTRAST Result Date: 08/18/2024 CLINICAL DATA:  Acute abdominal pain. EXAM: CT ABDOMEN AND PELVIS WITH CONTRAST TECHNIQUE: Multidetector CT imaging of the abdomen and pelvis was performed using the standard protocol following bolus administration of intravenous contrast. RADIATION DOSE REDUCTION: This exam was performed according to the departmental dose-optimization program which includes automated exposure control, adjustment of the mA and/or kV according to patient size and/or use of iterative reconstruction technique. CONTRAST:  75mL OMNIPAQUE  IOHEXOL  350 MG/ML SOLN COMPARISON:  CT abdomen and pelvis 03/03/2024. pelvic ultrasound 03/04/2024. FINDINGS: Lower chest: Severe emphysematous changes are again seen in both lower lungs. Hepatobiliary: No focal liver abnormality is  seen. No gallstones, gallbladder wall thickening, or biliary dilatation. Pancreas: There is diffuse peripancreatic fluid. Pancreatic duct is prominent in size and appears stable measuring up to 4 mm. No enhancing fluid collections identified. Pancreas enhances normally. Spleen: Within normal limits. Adrenals/Urinary Tract: 1 cm cyst is stable in the left kidney. There is no hydronephrosis or perinephric fluid. Adrenal glands and bladder are within normal limits. Stomach/Bowel: There is diffuse colonic wall thickening versus normal under distention. No focal inflammation identified. There is sigmoid colon diverticulosis. Appendix is normal in size. Stomach appears within normal limits. Vascular/Lymphatic: Aortic atherosclerosis. No enlarged abdominal or pelvic lymph nodes. Reproductive: Left adnexal mass appears unchanged, possibly enlarged ovary. Right ovary and uterus are within normal limits. Vaginal cystic area is also unchanged. Other: There is a small amount of free fluid in the pelvis. There is no focal abdominal wall hernia. Musculoskeletal: There is a sclerotic density and superior portion of the L5 vertebral body, unchanged, possibly a bone island. IMPRESSION: 1. Findings compatible with acute pancreatitis. No evidence for pancreatic necrosis or pseudocyst. 2. Diffuse colonic wall thickening versus normal under distention. Correlate clinically for colitis. 3. Small amount of free fluid in the pelvis. 4. Stable left adnexal mass, possibly enlarged ovary. Please see pelvic ultrasound from 03/12/2024. 5. Aortic atherosclerosis. Aortic Atherosclerosis (ICD10-I70.0). Electronically Signed   By: Greig Pique M.D.   On: 08/18/2024 23:05    EKG: Independently reviewed.  Sinus rhythm.  Assessment/Plan Principal Problem:   Acute pancreatitis    Acute pancreatitis suspect likely alcohol induced.  Will keep patient n.p.o. and on IV fluids pain relief medications.  Check ultrasound of the gallbladder and also  triglyceride levels. Alcohol abuse on CIWA protocol.  Thiamine .  Social worker consult. 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  Check PT/INR to calculate MELD score.  Follow LFTs.  Checking ultrasound of the abdomen. Left adnexal mass seen on the CAT scan will need follow-up. COPD not actively wheezing will continue with home inhalers.  Uses Stiolto. Hypertension will keep patient on as needed IV hydralazine  while NPO.  Takes amlodipine  at home. Depression on fluoxetine . Hyperglycemia check hemoglobin A1c.  Since patient has acute pancreatitis will need close monitoring further workup and more than 2 midnight stay.   DVT prophylaxis: Lovenox . Code Status: Full code. Family Communication: Discussed with patient. Disposition Plan: Medical floor. Consults called: None. Admission status: Observation.

## 2024-08-18 NOTE — ED Provider Notes (Signed)
 Patient the patient's care from my PA Dorn [name].  [name] [name] is a pleasant 63 year old female with a past medical history of alcohol-induced pancreatitis presenting for epigastric abdominal pain and nausea without vomiting.  She admits to alcohol intake yesterday.  Laboratory studies are concerning for lipase level of 1190.  Mild transaminitis with AST of 98 and an ALT of 107.  Total bilirubin of 1.7.  Leukocytosis of 12.9.    Physical Exam  BP (!) 141/91   Pulse 90   Resp 20   SpO2 92%   Physical Exam  Procedures  Procedures  ED Course / MDM   Clinical Course as of 08/18/24 2323  Wed Aug 18, 2024  2148 Lipase(!): 1,190 [JG]    Clinical Course User Index [JG] [name] Dorn BROCKS, PA   Medical Decision Making Amount and/or Complexity of Data Reviewed Labs: ordered. Decision-making details documented in ED Course. Radiology: ordered.  Risk Prescription drug management. Decision regarding hospitalization.   CT abdomen and pelvis with IV contrast confirms acute pancreatitis without complicating features.  Unable to control patient's pain in the emergency department.  IV fluids ordered.  Fentanyl  given 50 mcg x 2.  Patient accepted by admitting team for acute pancreatitis for pain control.       Elnor Bernarda SQUIBB, DO 08/18/24 2339

## 2024-08-18 NOTE — ED Triage Notes (Addendum)
 Patient BIB EMS from home c/o abdominal pain and nausea. Patient has cirrhosis and states that she had alcohol yesterday. Patient c/o nausea, but has not thrown up. Patient has history of COPD as well. Patient does have some abdominal distention present.

## 2024-08-18 NOTE — ED Provider Notes (Signed)
 [facility] AT [facility] Provider Note   CSN: [id number] Arrival date & time: 08/18/24  1836     Patient presents with: Abdominal Pain   [name] [name] is a 63 y.o. female who presents to the ED today with primary concern of abdominal pain, has a history of cirrhosis and previous ED admission for acute pancreatitis most recently and March of this year, does endorse alcohol intake yesterday.  She complains of nausea without vomiting.  Further has a history of COPD with chronic oxygen  use.  Endorses multiple episodes of loose stools as well as emesis.    Abdominal Pain Associated symptoms: diarrhea, nausea and vomiting        Prior to Admission medications   Medication Sig Start Date End Date Taking? Authorizing Provider  acetaminophen  (TYLENOL ) 325 MG tablet Take 325-650 mg by mouth every 6 (six) hours as needed (pain).    [provider]  albuterol  (PROVENTIL ) (2.5 MG/3ML) 0.083% nebulizer solution Take 3 mLs (2.5 mg total) by nebulization every 6 (six) hours as needed for wheezing or shortness of breath. 02/06/16   Alaine Vicenta NOVAK, MD  albuterol  (VENTOLIN  HFA) 108 (90 Base) MCG/ACT inhaler INHALE 2 PUFFS INTO THE LUNGS EVERY 6 HOURS AS NEEDED FOR WHEEZING OR SHORTNESS OF BREATH 12/24/23   Darlean Ozell NOVAK, MD  amLODipine  (NORVASC ) 5 MG tablet Take 5 mg by mouth every morning. 05/11/20   [provider]  FLUoxetine  (PROZAC ) 20 MG capsule Take 1 capsule (20 mg total) by mouth daily. 06/24/24   Carvin Arvella HERO, MD  HYDROcodone -acetaminophen  (NORCO/VICODIN) 5-325 MG tablet Take 1 tablet by mouth every 6 (six) hours as needed for severe pain (pain score 7-10). 03/03/24   Barrett, Jamie N, PA-C  Multiple Vitamin (MULTIVITAMIN WITH MINERALS) TABS tablet Take 1 tablet by mouth every morning.    [provider]  nicotine  (NICODERM CQ  - DOSED IN MG/24 HOURS) 14 mg/24hr patch Place 1 patch (14 mg total) onto the skin daily. 04/22/24   Carvin Arvella HERO, MD  OXYGEN  Inhale 5 L into the lungs continuous.    [provider]  STIOLTO RESPIMAT  2.5-2.5 MCG/ACT AERS INHALE 2 PUFFS INTO THE LUNGS DAILY 04/12/24   Darlean Ozell NOVAK, MD  traZODone  (DESYREL ) 50 MG tablet Take 1 tablet (50 mg total) by mouth at bedtime. 06/24/24   Carvin Arvella HERO, MD  Vibegron  (GEMTESA ) 75 MG TABS Take 1 tablet (75 mg total) by mouth at bedtime. 05/13/24   Dallie Vera GAILS, MD    Allergies: Clobetasol  emul foam w-moistcr    Review of Systems  Gastrointestinal:  Positive for abdominal pain, diarrhea, nausea and vomiting.  All other systems reviewed and are negative.   Updated Vital Signs BP (!) 141/91   Pulse 90   Resp 20   SpO2 92%   Physical Exam Vitals and nursing note reviewed.  Constitutional:      General: She is not in acute distress.    Appearance: Normal appearance.  HENT:     Head: Normocephalic and atraumatic.     Mouth/Throat:     Mouth: Mucous membranes are moist.     Pharynx: Oropharynx is clear.  Eyes:     Extraocular Movements: Extraocular movements intact.     Conjunctiva/sclera: Conjunctivae normal.     Pupils: Pupils are equal, round, and reactive to light.  Cardiovascular:     Rate and Rhythm: Normal rate and regular rhythm.     Pulses: Normal pulses.  Heart sounds: Normal heart sounds. No murmur heard.    No friction rub. No gallop.  Pulmonary:     Effort: Pulmonary effort is normal.     Breath sounds: Normal breath sounds.  Abdominal:     General: Abdomen is flat. Bowel sounds are normal.     Palpations: Abdomen is soft.     Tenderness: There is generalized abdominal tenderness.     Comments: Generalized abdominal tenderness with greater tenderness appreciated the epigastrium and right upper quadrant.  Musculoskeletal:        General: Normal range of motion.     Cervical back: Normal range of motion and neck supple.     Right lower leg: No edema.     Left lower leg: No edema.  Skin:    General: Skin is warm  and dry.     Capillary Refill: Capillary refill takes less than 2 seconds.  Neurological:     General: No focal deficit present.     Mental Status: She is alert. Mental status is at baseline.  Psychiatric:        Mood and Affect: Mood normal.     (all labs ordered are listed, but only abnormal results are displayed) Labs Reviewed  COMPREHENSIVE METABOLIC PANEL WITH GFR - Abnormal; Notable for the following components:      Result Value   Potassium 3.2 (*)    Chloride 97 (*)    Glucose, Bld 160 (*)    BUN 5 (*)    AST 98 (*)    ALT 107 (*)    Total Bilirubin 1.7 (*)    All other components within normal limits  LIPASE, BLOOD - Abnormal; Notable for the following components:   Lipase 1,190 (*)    All other components within normal limits  CBC WITH DIFFERENTIAL/PLATELET - Abnormal; Notable for the following components:   WBC 12.9 (*)    Hemoglobin 15.2 (*)    Neutro Abs 10.9 (*)    All other components within normal limits  URINALYSIS, ROUTINE W REFLEX MICROSCOPIC    EKG: None  Radiology: No results found.   Procedures   Medications Ordered in the ED  potassium chloride  10 mEq in 100 mL IVPB (has no administration in time range)  fentaNYL  (SUBLIMAZE ) injection 50 mcg (50 mcg Intravenous Given 08/18/24 2024)  ondansetron  (ZOFRAN ) injection 4 mg (4 mg Intravenous Given 08/18/24 2024)    Clinical Course as of 08/18/24 2220  Wed Aug 18, 2024  2148 Lipase(!): 1,190 [JG]    Clinical Course User Index [JG] Myriam Dorn BROCKS, PA                                 Medical Decision Making Amount and/or Complexity of Data Reviewed Labs: ordered. Decision-making details documented in ED Course. Radiology: ordered.  Risk Prescription drug management.   Medical Decision Making:   [name] [name] is a 63 y.o. female who presented to the ED today with abdominal pain detailed above.    External chart has been reviewed including previous labs and imaging. Patient placed on  continuous vitals and telemetry monitoring while in ED which was reviewed periodically.  Complete initial physical exam performed, notably the patient  was alert and oriented, .    Reviewed and confirmed nursing documentation for past medical history, family history, social history.    Initial Assessment:   With the patient's presentation of upper abdominal pain along  with nausea, most likely diagnosis is acute pancreatitis and/or hepatobiliary disease..  Further consider other intra-abdominal pathology, as well as vascular etiology of her pain.   Initial Plan:  Obtain CT of the abdomen to evaluate for possible intra-abdominal pathology, pancreatic pathology. Screening labs including CBC and Metabolic panel to evaluate for infectious or metabolic etiology of disease.  Include serum lipase to evaluate for possible pancreatic pathology. Urinalysis with reflex culture ordered to evaluate for UTI or relevant urologic/nephrologic pathology.  CXR to evaluate for structural/infectious intrathoracic pathology.  EKG to evaluate for cardiac pathology Provide initial pain management with fentanyl  as well as nausea management with ondansetron . Objective evaluation as below reviewed   Initial Study Results:   Laboratory  All laboratory results reviewed without evidence of clinically relevant pathology.   Exceptions include: Hyponatremia at 3.2, increased transaminases, profoundly increased lipase at 1190.  WBC 12.9   EKG EKG was reviewed independently. Rate, rhythm, axis, intervals all examined and without medically relevant abnormality. ST segments without concerns for elevations.    Radiology:  CT imaging pending at time of care handoff.    Reassessment and Plan:   Patient has profoundly increased lipase suggesting acute pancreatitis.  CT imaging pending at time of care handoff.   Pain decreased with fentanyl , however pain still present.  Nausea resolved post ondansetron .  At this time,  disposition pending imaging.  Noted hypokalemia, provided IV repletion secondary to acute pancreatitis, likely unable to tolerate PO tablets.  At time of care handoff, patient is pending CT scan.  Dissipate secondary to decreased p.o. intake as well as need for pain control, patient will require admission for both related to the pancreatitis.  Care handed off to a Dr. Elnor, DO.       Final diagnoses:  Alcohol-induced acute pancreatitis, unspecified complication status    ED Discharge Orders     None          Myriam Dorn BROCKS, PA 08/18/24 2221    Elnor Hila P, DO 08/18/24 2353

## 2024-08-19 ENCOUNTER — Observation Stay (HOSPITAL_COMMUNITY)

## 2024-08-19 ENCOUNTER — Encounter (HOSPITAL_COMMUNITY): Payer: Self-pay | Admitting: Internal Medicine

## 2024-08-19 DIAGNOSIS — I10 Essential (primary) hypertension: Secondary | ICD-10-CM | POA: Diagnosis present

## 2024-08-19 DIAGNOSIS — Z9981 Dependence on supplemental oxygen: Secondary | ICD-10-CM | POA: Diagnosis not present

## 2024-08-19 DIAGNOSIS — R739 Hyperglycemia, unspecified: Secondary | ICD-10-CM | POA: Diagnosis present

## 2024-08-19 DIAGNOSIS — Z8249 Family history of ischemic heart disease and other diseases of the circulatory system: Secondary | ICD-10-CM | POA: Diagnosis not present

## 2024-08-19 DIAGNOSIS — F1010 Alcohol abuse, uncomplicated: Secondary | ICD-10-CM | POA: Insufficient documentation

## 2024-08-19 DIAGNOSIS — Z808 Family history of malignant neoplasm of other organs or systems: Secondary | ICD-10-CM | POA: Diagnosis not present

## 2024-08-19 DIAGNOSIS — K859 Acute pancreatitis without necrosis or infection, unspecified: Secondary | ICD-10-CM | POA: Diagnosis present

## 2024-08-19 DIAGNOSIS — Z860100 Personal history of colon polyps, unspecified: Secondary | ICD-10-CM | POA: Diagnosis not present

## 2024-08-19 DIAGNOSIS — J439 Emphysema, unspecified: Secondary | ICD-10-CM | POA: Diagnosis present

## 2024-08-19 DIAGNOSIS — Z23 Encounter for immunization: Secondary | ICD-10-CM | POA: Diagnosis present

## 2024-08-19 DIAGNOSIS — Z888 Allergy status to other drugs, medicaments and biological substances status: Secondary | ICD-10-CM | POA: Diagnosis not present

## 2024-08-19 DIAGNOSIS — K7010 Alcoholic hepatitis without ascites: Secondary | ICD-10-CM | POA: Diagnosis present

## 2024-08-19 DIAGNOSIS — D573 Sickle-cell trait: Secondary | ICD-10-CM | POA: Diagnosis present

## 2024-08-19 DIAGNOSIS — Z800 Family history of malignant neoplasm of digestive organs: Secondary | ICD-10-CM | POA: Diagnosis not present

## 2024-08-19 DIAGNOSIS — M810 Age-related osteoporosis without current pathological fracture: Secondary | ICD-10-CM | POA: Diagnosis present

## 2024-08-19 DIAGNOSIS — K852 Alcohol induced acute pancreatitis without necrosis or infection: Secondary | ICD-10-CM

## 2024-08-19 DIAGNOSIS — R748 Abnormal levels of other serum enzymes: Secondary | ICD-10-CM | POA: Diagnosis present

## 2024-08-19 DIAGNOSIS — E876 Hypokalemia: Secondary | ICD-10-CM | POA: Diagnosis present

## 2024-08-19 DIAGNOSIS — E871 Hypo-osmolality and hyponatremia: Secondary | ICD-10-CM | POA: Diagnosis present

## 2024-08-19 DIAGNOSIS — R7401 Elevation of levels of liver transaminase levels: Secondary | ICD-10-CM | POA: Diagnosis present

## 2024-08-19 DIAGNOSIS — F17210 Nicotine dependence, cigarettes, uncomplicated: Secondary | ICD-10-CM | POA: Diagnosis present

## 2024-08-19 DIAGNOSIS — Z79899 Other long term (current) drug therapy: Secondary | ICD-10-CM | POA: Diagnosis not present

## 2024-08-19 DIAGNOSIS — F329 Major depressive disorder, single episode, unspecified: Secondary | ICD-10-CM | POA: Diagnosis present

## 2024-08-19 LAB — CBC WITH DIFFERENTIAL/PLATELET
Abs Immature Granulocytes: 0.07 K/uL (ref 0.00–0.07)
Basophils Absolute: 0 K/uL (ref 0.0–0.1)
Basophils Relative: 0 %
Eosinophils Absolute: 0 K/uL (ref 0.0–0.5)
Eosinophils Relative: 0 %
HCT: 43.3 % (ref 36.0–46.0)
Hemoglobin: 14.8 g/dL (ref 12.0–15.0)
Immature Granulocytes: 1 %
Lymphocytes Relative: 9 %
Lymphs Abs: 1.1 K/uL (ref 0.7–4.0)
MCH: 29.9 pg (ref 26.0–34.0)
MCHC: 34.2 g/dL (ref 30.0–36.0)
MCV: 87.5 fL (ref 80.0–100.0)
Monocytes Absolute: 0.4 K/uL (ref 0.1–1.0)
Monocytes Relative: 4 %
Neutro Abs: 10.7 K/uL — ABNORMAL HIGH (ref 1.7–7.7)
Neutrophils Relative %: 86 %
Platelets: 119 K/uL — ABNORMAL LOW (ref 150–400)
RBC: 4.95 MIL/uL (ref 3.87–5.11)
RDW: 13.7 % (ref 11.5–15.5)
WBC: 12.2 K/uL — ABNORMAL HIGH (ref 4.0–10.5)
nRBC: 0 % (ref 0.0–0.2)

## 2024-08-19 LAB — HEMOGLOBIN A1C
Hgb A1c MFr Bld: 5.6 % (ref 4.8–5.6)
Mean Plasma Glucose: 114.02 mg/dL

## 2024-08-19 LAB — BASIC METABOLIC PANEL WITH GFR
Anion gap: 12 (ref 5–15)
BUN: 7 mg/dL — ABNORMAL LOW (ref 8–23)
CO2: 25 mmol/L (ref 22–32)
Calcium: 8.3 mg/dL — ABNORMAL LOW (ref 8.9–10.3)
Chloride: 96 mmol/L — ABNORMAL LOW (ref 98–111)
Creatinine, Ser: 0.52 mg/dL (ref 0.44–1.00)
GFR, Estimated: >60 mL/min (ref >60)
Glucose, Bld: 125 mg/dL — ABNORMAL HIGH (ref 70–99)
Potassium: 4.7 mmol/L (ref 3.5–5.1)
Sodium: 133 mmol/L — ABNORMAL LOW (ref 135–145)

## 2024-08-19 LAB — PROTIME-INR
INR: 1 (ref 0.8–1.2)
Prothrombin Time: 13.3 s (ref 11.4–15.2)

## 2024-08-19 LAB — HEPATIC FUNCTION PANEL
ALT: 99 U/L — ABNORMAL HIGH (ref 0–44)
AST: 84 U/L — ABNORMAL HIGH (ref 15–41)
Albumin: 3.8 g/dL (ref 3.5–5.0)
Alkaline Phosphatase: 113 U/L (ref 38–126)
Bilirubin, Direct: 0.5 mg/dL — ABNORMAL HIGH (ref 0.0–0.2)
Indirect Bilirubin: 1.2 mg/dL — ABNORMAL HIGH (ref 0.3–0.9)
Total Bilirubin: 1.7 mg/dL — ABNORMAL HIGH (ref 0.0–1.2)
Total Protein: 7.2 g/dL (ref 6.5–8.1)

## 2024-08-19 LAB — TRIGLYCERIDES: Triglycerides: 63 mg/dL (ref <150)

## 2024-08-19 MED ORDER — LACTATED RINGERS IV SOLN: INTRAVENOUS | Status: DC

## 2024-08-19 MED ORDER — PNEUMOCOCCAL 20-VAL CONJ VACC 0.5 ML IM SUSY
0.5000 mL | PREFILLED_SYRINGE | INTRAMUSCULAR | Status: AC
  Administered 2024-08-21: 0.5 mL via INTRAMUSCULAR
  Filled 2024-08-19: qty 0.5

## 2024-08-19 MED ORDER — FOLIC ACID 1 MG PO TABS
1.0000 mg | ORAL_TABLET | Freq: Every day | ORAL | Status: DC
  Administered 2024-08-19 – 2024-08-21 (×3): 1 mg via ORAL
  Filled 2024-08-19 (×3): qty 1

## 2024-08-19 MED ORDER — THIAMINE HCL 100 MG/ML IJ SOLN
500.0000 mg | Freq: Once | INTRAVENOUS | Status: AC
  Administered 2024-08-19: 500 mg via INTRAVENOUS
  Filled 2024-08-19: qty 5

## 2024-08-19 MED ORDER — LORAZEPAM 2 MG/ML IJ SOLN: 0.0000 mg | Freq: Four times a day (QID) | INTRAMUSCULAR | Status: AC

## 2024-08-19 MED ORDER — LORAZEPAM 1 MG PO TABS: 1.0000 mg | ORAL_TABLET | ORAL | Status: DC | PRN

## 2024-08-19 MED ORDER — INFLUENZA VAC SPLIT HIGH-DOSE 0.5 ML IM SUSY
0.5000 mL | PREFILLED_SYRINGE | INTRAMUSCULAR | Status: AC
  Administered 2024-08-21: 0.5 mL via INTRAMUSCULAR
  Filled 2024-08-19: qty 0.5

## 2024-08-19 MED ORDER — THIAMINE MONONITRATE 100 MG PO TABS
100.0000 mg | ORAL_TABLET | Freq: Every day | ORAL | Status: DC
  Administered 2024-08-19 – 2024-08-21 (×3): 100 mg via ORAL
  Filled 2024-08-19 (×3): qty 1

## 2024-08-19 MED ORDER — LORAZEPAM 2 MG/ML IJ SOLN
1.0000 mg | INTRAMUSCULAR | Status: DC | PRN
  Administered 2024-08-19: 1 mg via INTRAVENOUS
  Filled 2024-08-19: qty 1

## 2024-08-19 MED ORDER — FENTANYL CITRATE PF 50 MCG/ML IJ SOSY
25.0000 ug | PREFILLED_SYRINGE | INTRAMUSCULAR | Status: DC | PRN
  Administered 2024-08-19: 25 ug via INTRAVENOUS
  Filled 2024-08-19: qty 1

## 2024-08-19 MED ORDER — HYDRALAZINE HCL 20 MG/ML IJ SOLN: 5.0000 mg | INTRAMUSCULAR | Status: DC | PRN

## 2024-08-19 MED ORDER — SODIUM CHLORIDE 0.9 % IV SOLN: INTRAVENOUS | Status: AC

## 2024-08-19 MED ORDER — ALBUTEROL SULFATE (2.5 MG/3ML) 0.083% IN NEBU: 3.0000 mL | INHALATION_SOLUTION | Freq: Four times a day (QID) | RESPIRATORY_TRACT | Status: DC | PRN

## 2024-08-19 MED ORDER — OXYCODONE HCL 5 MG PO TABS
10.0000 mg | ORAL_TABLET | Freq: Four times a day (QID) | ORAL | Status: DC | PRN
  Administered 2024-08-19 (×2): 10 mg via ORAL
  Filled 2024-08-19 (×2): qty 2

## 2024-08-19 MED ORDER — AMLODIPINE BESYLATE 5 MG PO TABS
5.0000 mg | ORAL_TABLET | Freq: Every day | ORAL | Status: DC
  Administered 2024-08-19 – 2024-08-21 (×3): 5 mg via ORAL
  Filled 2024-08-19 (×3): qty 1

## 2024-08-19 MED ORDER — UMECLIDINIUM BROMIDE 62.5 MCG/ACT IN AEPB
1.0000 | INHALATION_SPRAY | Freq: Every day | RESPIRATORY_TRACT | Status: DC
  Administered 2024-08-19 – 2024-08-21 (×3): 1 via RESPIRATORY_TRACT
  Filled 2024-08-19 (×2): qty 7

## 2024-08-19 MED ORDER — ARFORMOTEROL TARTRATE 15 MCG/2ML IN NEBU
15.0000 ug | INHALATION_SOLUTION | Freq: Two times a day (BID) | RESPIRATORY_TRACT | Status: DC
  Administered 2024-08-19 – 2024-08-21 (×5): 15 ug via RESPIRATORY_TRACT
  Filled 2024-08-19 (×5): qty 2

## 2024-08-19 MED ORDER — HEPARIN SODIUM (PORCINE) 5000 UNIT/ML IJ SOLN
5000.0000 [IU] | Freq: Three times a day (TID) | INTRAMUSCULAR | Status: DC
  Administered 2024-08-19 – 2024-08-21 (×6): 5000 [IU] via SUBCUTANEOUS
  Filled 2024-08-19 (×6): qty 1

## 2024-08-19 MED ORDER — ADULT MULTIVITAMIN W/MINERALS CH
1.0000 | ORAL_TABLET | Freq: Every day | ORAL | Status: DC
  Administered 2024-08-19 – 2024-08-21 (×3): 1 via ORAL
  Filled 2024-08-19 (×3): qty 1

## 2024-08-19 MED ORDER — LORAZEPAM 2 MG/ML IJ SOLN: 0.0000 mg | Freq: Two times a day (BID) | INTRAMUSCULAR | Status: DC

## 2024-08-19 MED ORDER — THIAMINE HCL 100 MG/ML IJ SOLN: 100.0000 mg | Freq: Every day | INTRAMUSCULAR | Status: DC

## 2024-08-19 NOTE — Discharge Instructions (Signed)

## 2024-08-19 NOTE — Progress Notes (Signed)
 Progress Note   Patient: [name] [name] FMW:[id number] DOB: 04-13-1961 DOA: 08/18/2024     0 DOS: the patient was seen and examined on 08/19/2024   Brief hospital course: BREDA BOND is a 63 y.o. female with history of COPD with ongoing tobacco abuse, hypertension presents to the ER with complaints of abdominal pain.  Patient states over the last 3 days she has been having epigastric pain with some nausea vomiting but denies any diarrhea.  Pain radiates to her back.  CT abdomen pelvis shows features concerning for acute pancreatitis there is no gallstone seen.   Assessment and Plan:  Acute pancreatitis suspect likely alcohol induced.   I have counseled patient concerning alcohol cessation Continue aggressive IV fluid Advance diet as tolerated Continue as needed pain medication  Alcohol abuse on CIWA protocol.   Continue thiamine   Acute transaminitis Abdominal ultrasound showed normal liver Monitor LFTs  Left adnexal mass seen on the CT scan will need follow-up.  COPD not in acute exacerbation Continue as needed nebulization  Hypertension  Continue amlodipine   Depression on fluoxetine .  Hyperglycemia  Follow-up on check hemoglobin A1c.    DVT prophylaxis: Lovenox . Code Status: Full code. Family Communication: Discussed with patient. Disposition Plan: Medical floor. Consults called: None. Admission status: Inpatient   Subjective:  Patient seen and examined still has epigastric pain however nausea and vomiting improved She admits to improvement in epigastric pain Denies chest pain cough or urinary complaints  Physical Exam: Eyes: Anicteric no pallor. ENMT: No discharge from the ears eyes nose or mouth. Neck: No mass felt.  No neck rigidity. Respiratory: No rhonchi or crepitations. Cardiovascular: S1-S2 heard. Abdomen: Epigastric tenderness no guarding or rigidity. Musculoskeletal: No edema. Skin: No rash. Neurologic: Alert awake oriented time place and person.   Moves all extremities. Psychiatric: Appears normal.  Normal affect.    Vitals:   08/19/24 0700 08/19/24 1000 08/19/24 1204 08/19/24 1330  BP: (!) 167/100 (!) 143/99 (!) 143/99 (!) 165/101  Pulse: (!) 109  (!) 108 (!) 106  Resp: (!) 21 18  18   Temp:    98.4 F (36.9 C)  TempSrc:    Oral  SpO2: 97%   96%    Data Reviewed: CT scan of the abdomen reviewed showing findings compatible with acute pancreatitis with no evidence of pancreatic necrosis or pseudocyst    Latest Ref Rng & Units 08/19/2024    3:45 AM 08/18/2024    8:20 PM 03/03/2024    7:34 AM  CBC  WBC 4.0 - 10.5 K/uL 12.2  12.9  13.6   Hemoglobin 12.0 - 15.0 g/dL 85.1  84.7  85.7   Hematocrit 36.0 - 46.0 % 43.3  43.7  40.1   Platelets 150 - 400 K/uL 119  220  225        Latest Ref Rng & Units 08/19/2024    3:45 AM 08/18/2024    8:20 PM 03/03/2024    7:34 AM  BMP  Glucose 70 - 99 mg/dL 874  839  881   BUN 8 - 23 mg/dL 7  5  6    Creatinine 0.44 - 1.00 mg/dL 9.47  9.38  9.42   Sodium 135 - 145 mmol/L 133  136  139   Potassium 3.5 - 5.1 mmol/L 4.7  3.2  3.2   Chloride 98 - 111 mmol/L 96  97  104   CO2 22 - 32 mmol/L 25  26  22    Calcium 8.9 - 10.3 mg/dL 8.3  8.9  9.4       Time spent: 52 minutes  Author: Drue ONEIDA Potter, MD 08/19/2024 2:16 PM  For on call review [email or web address].

## 2024-08-19 NOTE — ED Notes (Signed)
 Help patient up to the bedside toilet patient did well patient is now back in bed on the monitor with call bell in reach

## 2024-08-19 NOTE — Progress Notes (Signed)
 CSW added substance abuse resources to patient's AVS.  Niels Portugal, MSW, LCSW Transitions of Care  Clinical Social Worker II [phone]

## 2024-08-20 DIAGNOSIS — K852 Alcohol induced acute pancreatitis without necrosis or infection: Secondary | ICD-10-CM | POA: Diagnosis not present

## 2024-08-20 MED ORDER — ENSURE PLUS HIGH PROTEIN PO LIQD
237.0000 mL | Freq: Two times a day (BID) | ORAL | Status: DC
  Administered 2024-08-20 – 2024-08-21 (×3): 237 mL via ORAL

## 2024-08-20 NOTE — Progress Notes (Signed)
   08/20/24 1251  TOC Brief Assessment  Insurance and Status Reviewed  Patient has primary care physician Yes  Home environment has been reviewed home  Prior level of function: independent  Prior/Current Home Services No current home services  Social Drivers of Health Review SDOH reviewed no interventions necessary  Readmission risk has been reviewed Yes  Transition of care needs  (SW provided substance use resources)     Transition of Care Department (TOC) has reviewed patient and no TOC needs have been identified at this time. We will continue to monitor patient advancement through interdisciplinary progression rounds. If new patient transition needs arise, please place a TOC consult.

## 2024-08-20 NOTE — Plan of Care (Signed)

## 2024-08-20 NOTE — Progress Notes (Signed)
 PROGRESS NOTE    [name] [name]  FMW:[id number] DOB: [birth year] DOA: 08/18/2024 PCP: Valma Carwin, MD     Brief Narrative:  [name] [name] is a 63 y.o. female with history of COPD with ongoing tobacco abuse, hypertension presents to the ER with complaints of abdominal pain.  Patient states over the last 3 days she has been having epigastric pain with some nausea vomiting but denies any diarrhea.  Pain radiates to her back.  CT abdomen pelvis shows features concerning for acute pancreatitis there is no gallstone seen.   New events last 24 hours / Subjective: Patient tolerated clear liquid diet without any issues.  She denies any nausea, vomiting or abdominal pain.  She does have tenderness to palpation in the epigastrium  Assessment & Plan:   Principal Problem:   Acute pancreatitis Active Problems:   Cigarette smoker   COPD with emphysema (HCC)   Alcohol abuse   Hyperglycemia   Acute alcohol-induced pancreatitis - Continue IV fluid, advance to full liquid diet for lunch.  If tolerates well, can advance to soft diet  Alcohol abuse - Thiamine , folic acid , CIWA protocol  Elevated liver enzymes - Insetting of alcohol use.  Improved  Left adnexal mass - Follow-up outpatient  COPD - Without acute exacerbation  Hypertension - Amlodipine   Depression - Fluoxetine    DVT prophylaxis:  heparin  injection 5,000 Units Start: 08/19/24 0600  Code Status: Full code Family Communication: None Disposition Plan: Home Status is: Inpatient Remains inpatient appropriate because: IV fluid, advance diet today    Antimicrobials:  Anti-infectives (From admission, onward)    None        Objective: Vitals:   08/20/24 0408 08/20/24 0827 08/20/24 0933 08/20/24 1200  BP: (!) 137/57  (!) 137/57 127/76  Pulse: 87   89  Resp: 14   15  Temp: 97.7 F (36.5 C)   99.3 F (37.4 C)  TempSrc:    Oral  SpO2: 93% 94%  96%    Intake/Output Summary (Last 24 hours) at 08/20/2024 1452 Last  data filed at 08/20/2024 0900 Gross per 24 hour  Intake 350 ml  Output 400 ml  Net -50 ml   There were no vitals filed for this visit.  Examination:  General exam: Appears calm and comfortable  Respiratory system: Clear to auscultation. Respiratory effort normal. No respiratory distress. No conversational dyspnea.  Cardiovascular system: S1 & S2 heard, RRR. No murmurs. No pedal edema. Gastrointestinal system: Abdomen is nondistended, soft and tenderness to palpation epigastrium Central nervous system: Alert and oriented. No focal neurological deficits. Speech clear.  Extremities: Symmetric in appearance  Skin: No rashes, lesions or ulcers on exposed skin  Psychiatry: Judgement and insight appear normal. Mood & affect appropriate.   Data Reviewed: I have personally reviewed following labs and imaging studies  CBC: Recent Labs  Lab 08/18/24 2020 08/19/24 0345  WBC 12.9* 12.2*  NEUTROABS 10.9* 10.7*  HGB 15.2* 14.8  HCT 43.7 43.3  MCV 86.9 87.5  PLT 220 119*   Basic Metabolic Panel: Recent Labs  Lab 08/18/24 2020 08/19/24 0345  NA 136 133*  K 3.2* 4.7  CL 97* 96*  CO2 26 25  GLUCOSE 160* 125*  BUN 5* 7*  CREATININE 0.61 0.52  CALCIUM 8.9 8.3*   GFR: CrCl cannot be calculated (Unknown ideal weight.). Liver Function Tests: Recent Labs  Lab 08/18/24 2020 08/19/24 0345  AST 98* 84*  ALT 107* 99*  ALKPHOS 121 113  BILITOT 1.7* 1.7*  PROT 7.5  7.2  ALBUMIN 3.9 3.8   Recent Labs  Lab 08/18/24 2020  LIPASE 1,190*   No results for input(s): AMMONIA in the last 168 hours. Coagulation Profile: Recent Labs  Lab 08/19/24 1047  INR 1.0   Cardiac Enzymes: No results for input(s): CKTOTAL, CKMB, CKMBINDEX, TROPONINI in the last 168 hours. BNP (last 3 results) No results for input(s): PROBNP in the last 8760 hours. HbA1C: Recent Labs    08/19/24 0345  HGBA1C 5.6   CBG: No results for input(s): GLUCAP in the last 168 hours. Lipid  Profile: Recent Labs    08/19/24 0617  TRIG 63   Thyroid  Function Tests: No results for input(s): TSH, T4TOTAL, FREET4, T3FREE, THYROIDAB in the last 72 hours. Anemia Panel: No results for input(s): VITAMINB12, FOLATE, FERRITIN, TIBC, IRON, RETICCTPCT in the last 72 hours. Sepsis Labs: No results for input(s): PROCALCITON, LATICACIDVEN in the last 168 hours.  No results found for this or any previous visit (from the past 240 hours).    Radiology Studies: US  Abdomen Limited RUQ (LIVER/GB) Result Date: 08/19/2024 EXAM: Right Upper Quadrant Abdominal Ultrasound TECHNIQUE: Real-time ultrasonography of the right upper quadrant of the abdomen was performed. COMPARISON: Abdominal ultrasound dated 07/10/2001. CT of the abdomen and pelvis dated 08/18/2004. CLINICAL HISTORY: Abdominal pain. FINDINGS: LIVER: The liver demonstrates normal echogenicity. No intrahepatic biliary ductal dilatation. No mass. BILIARY SYSTEM: Mild echogenic debris within the gallbladder. No evidence of pericholecystic fluid or wall thickening. No cholelithiasis. Negative sonographic Murphy's sign. Common bile duct is within normal limits measuring 2 mm. OTHER: No right upper quadrant ascites. IMPRESSION: 1. Mild echogenic debris within the gallbladder. Electronically signed by: Evalene Coho MD 08/19/2024 05:56 AM EDT RP Workstation: [id number]   CT ABDOMEN PELVIS W CONTRAST Result Date: 08/18/2024 CLINICAL DATA:  Acute abdominal pain. EXAM: CT ABDOMEN AND PELVIS WITH CONTRAST TECHNIQUE: Multidetector CT imaging of the abdomen and pelvis was performed using the standard protocol following bolus administration of intravenous contrast. RADIATION DOSE REDUCTION: This exam was performed according to the departmental dose-optimization program which includes automated exposure control, adjustment of the mA and/or kV according to patient size and/or use of iterative reconstruction technique. CONTRAST:  75mL  OMNIPAQUE  IOHEXOL  350 MG/ML SOLN COMPARISON:  CT abdomen and pelvis 03/03/2024. pelvic ultrasound 03/04/2024. FINDINGS: Lower chest: Severe emphysematous changes are again seen in both lower lungs. Hepatobiliary: No focal liver abnormality is seen. No gallstones, gallbladder wall thickening, or biliary dilatation. Pancreas: There is diffuse peripancreatic fluid. Pancreatic duct is prominent in size and appears stable measuring up to 4 mm. No enhancing fluid collections identified. Pancreas enhances normally. Spleen: Within normal limits. Adrenals/Urinary Tract: 1 cm cyst is stable in the left kidney. There is no hydronephrosis or perinephric fluid. Adrenal glands and bladder are within normal limits. Stomach/Bowel: There is diffuse colonic wall thickening versus normal under distention. No focal inflammation identified. There is sigmoid colon diverticulosis. Appendix is normal in size. Stomach appears within normal limits. Vascular/Lymphatic: Aortic atherosclerosis. No enlarged abdominal or pelvic lymph nodes. Reproductive: Left adnexal mass appears unchanged, possibly enlarged ovary. Right ovary and uterus are within normal limits. Vaginal cystic area is also unchanged. Other: There is a small amount of free fluid in the pelvis. There is no focal abdominal wall hernia. Musculoskeletal: There is a sclerotic density and superior portion of the L5 vertebral body, unchanged, possibly a bone island. IMPRESSION: 1. Findings compatible with acute pancreatitis. No evidence for pancreatic necrosis or pseudocyst. 2. Diffuse colonic wall thickening versus normal under distention.  Correlate clinically for colitis. 3. Small amount of free fluid in the pelvis. 4. Stable left adnexal mass, possibly enlarged ovary. Please see pelvic ultrasound from 03/12/2024. 5. Aortic atherosclerosis. Aortic Atherosclerosis (ICD10-I70.0). Electronically Signed   By: Greig Pique M.D.   On: 08/18/2024 23:05      Scheduled Meds:  amLODipine    5 mg Oral Daily   arformoterol   15 mcg Nebulization BID   And   umeclidinium bromide   1 puff Inhalation Daily   feeding supplement  237 mL Oral BID BM   folic acid   1 mg Oral Daily   heparin   5,000 Units Subcutaneous Q8H   Influenza vac split trivalent PF  0.5 mL Intramuscular Tomorrow-1000   LORazepam   0-4 mg Intravenous Q6H   Followed by   NOREEN ON 08/21/2024] LORazepam   0-4 mg Intravenous Q12H   multivitamin with minerals  1 tablet Oral Daily   pneumococcal 20-valent conjugate vaccine  0.5 mL Intramuscular Tomorrow-1000   thiamine   100 mg Oral Daily   Or   thiamine   100 mg Intravenous Daily   Continuous Infusions:  sodium chloride  150 mL/hr at 08/20/24 0433     LOS: 1 day   Time spent: 25 minutes   Delon Hoe, DO [facility] 08/20/2024, 2:52 PM   Available via Epic secure chat 7am-7pm After these hours, please refer to coverage provider listed on [email or web address]

## 2024-08-21 DIAGNOSIS — I10 Essential (primary) hypertension: Secondary | ICD-10-CM | POA: Insufficient documentation

## 2024-08-21 DIAGNOSIS — K852 Alcohol induced acute pancreatitis without necrosis or infection: Secondary | ICD-10-CM | POA: Diagnosis not present

## 2024-08-21 DIAGNOSIS — R7989 Other specified abnormal findings of blood chemistry: Secondary | ICD-10-CM | POA: Insufficient documentation

## 2024-08-21 DIAGNOSIS — E876 Hypokalemia: Secondary | ICD-10-CM | POA: Insufficient documentation

## 2024-08-21 LAB — CBC
HCT: 34.6 % — ABNORMAL LOW (ref 36.0–46.0)
Hemoglobin: 12.2 g/dL (ref 12.0–15.0)
MCH: 30.7 pg (ref 26.0–34.0)
MCHC: 35.3 g/dL (ref 30.0–36.0)
MCV: 87.2 fL (ref 80.0–100.0)
Platelets: 170 K/uL (ref 150–400)
RBC: 3.97 MIL/uL (ref 3.87–5.11)
RDW: 13.4 % (ref 11.5–15.5)
WBC: 8.4 K/uL (ref 4.0–10.5)
nRBC: 0 % (ref 0.0–0.2)

## 2024-08-21 LAB — COMPREHENSIVE METABOLIC PANEL WITH GFR
ALT: 55 U/L — ABNORMAL HIGH (ref 0–44)
AST: 43 U/L — ABNORMAL HIGH (ref 15–41)
Albumin: 2.8 g/dL — ABNORMAL LOW (ref 3.5–5.0)
Alkaline Phosphatase: 87 U/L (ref 38–126)
Anion gap: 8 (ref 5–15)
BUN: <5 mg/dL — ABNORMAL LOW (ref 8–23)
CO2: 29 mmol/L (ref 22–32)
Calcium: 8.7 mg/dL — ABNORMAL LOW (ref 8.9–10.3)
Chloride: 100 mmol/L (ref 98–111)
Creatinine, Ser: 0.47 mg/dL (ref 0.44–1.00)
GFR, Estimated: >60 mL/min (ref >60)
Glucose, Bld: 84 mg/dL (ref 70–99)
Potassium: 2.9 mmol/L — ABNORMAL LOW (ref 3.5–5.1)
Sodium: 137 mmol/L (ref 135–145)
Total Bilirubin: 1.1 mg/dL (ref 0.0–1.2)
Total Protein: 6 g/dL — ABNORMAL LOW (ref 6.5–8.1)

## 2024-08-21 MED ORDER — POTASSIUM CHLORIDE CRYS ER 20 MEQ PO TBCR
40.0000 meq | EXTENDED_RELEASE_TABLET | ORAL | Status: AC
  Administered 2024-08-21 (×2): 40 meq via ORAL
  Filled 2024-08-21 (×2): qty 2

## 2024-08-21 NOTE — Plan of Care (Signed)

## 2024-08-21 NOTE — Discharge Summary (Addendum)
 Physician Discharge Summary  [name] [name] FMW:[id number] DOB: 1961-05-01 DOA: 08/18/2024  PCP: Valma Carwin, MD  Admit date: 08/18/2024 Discharge date: 08/21/2024  Admitted From: Home Disposition:  Home  Recommendations for Outpatient Follow-up:  Follow up with PCP Repeat blood work BMP to check potassium. Hypokalemia was replaced prior to discharge from hospital.  Discussed alcohol cessation  Stable left adnexal mass, possibly enlarged ovary.  Follow-up outpatient  Discharge Condition: Stable CODE STATUS: Full  Diet recommendation: Regular   Brief/Interim Summary: [name] [name] is a 63 y.o. female with history of COPD with ongoing tobacco abuse, hypertension presents to the ER with complaints of abdominal pain.  Patient states over the last 3 days she has been having epigastric pain with some nausea vomiting but denies any diarrhea.  Pain radiates to her back.  CT abdomen pelvis shows features concerning for acute pancreatitis there is no gallstone seen.  Patient continued to improve with supportive care.  Diet was advanced prior to discharge.  Patient encouraged for alcohol cessation.  Discharge Diagnoses:   Principal Problem:   Acute pancreatitis Active Problems:   Cigarette smoker   MDD (major depressive disorder)   COPD with emphysema (HCC)   Adnexal mass   Alcohol abuse   Hyperglycemia   HTN (hypertension)   Elevated LFTs   Alcohol induced acute pancreatitis   Hypokalemia  Discharge Instructions  Discharge Instructions     Call MD for:  difficulty breathing, headache or visual disturbances   Complete by: As directed    Call MD for:  extreme fatigue   Complete by: As directed    Call MD for:  persistant dizziness or light-headedness   Complete by: As directed    Call MD for:  persistant nausea and vomiting   Complete by: As directed    Call MD for:  severe uncontrolled pain   Complete by: As directed    Call MD for:  temperature >100.4   Complete by: As directed     Diet general   Complete by: As directed    Discharge instructions   Complete by: As directed    You were cared for by a hospitalist during your hospital stay. If you have any questions about your discharge medications or the care you received while you were in the hospital after you are discharged, you can call the unit and ask to speak with the hospitalist on call if the hospitalist that took care of you is not available. Once you are discharged, your primary care physician will handle any further medical issues. Please note that NO REFILLS for any discharge medications will be authorized once you are discharged, as it is imperative that you return to your primary care physician (or establish a relationship with a primary care physician if you do not have one) for your aftercare needs so that they can reassess your need for medications and monitor your lab values.   Increase activity slowly   Complete by: As directed       Allergies as of 08/21/2024       Reactions   Clobetasol  Emul Foam W-moistcr Other (See Comments)   Sore throat, trouble breathing, swallowing,feeling sleepy, more hungry, passing urine more often,and weight loss        Medication List     STOP taking these medications    HYDROcodone -acetaminophen  5-325 MG tablet Commonly known as: NORCO/VICODIN       TAKE these medications    acetaminophen  325 MG tablet Commonly known as:  TYLENOL  Take 325-650 mg by mouth daily as needed for mild pain (pain score 1-3) or moderate pain (pain score 4-6).   albuterol  (2.5 MG/3ML) 0.083% nebulizer solution Commonly known as: PROVENTIL  Take 3 mLs (2.5 mg total) by nebulization every 6 (six) hours as needed for wheezing or shortness of breath.   albuterol  108 (90 Base) MCG/ACT inhaler Commonly known as: VENTOLIN  HFA INHALE 2 PUFFS INTO THE LUNGS EVERY 6 HOURS AS NEEDED FOR WHEEZING OR SHORTNESS OF BREATH   amLODipine  5 MG tablet Commonly known as: NORVASC  Take 5 mg by mouth  every morning.   atorvastatin 20 MG tablet Commonly known as: LIPITOR Take 20 mg by mouth daily.   FLUoxetine  20 MG capsule Commonly known as: PROZAC  Take 1 capsule (20 mg total) by mouth daily.   Gemtesa  75 MG Tabs Generic drug: Vibegron  Take 1 tablet (75 mg total) by mouth at bedtime.   multivitamin with minerals Tabs tablet Take 1 tablet by mouth every morning.   nicotine  14 mg/24hr patch Commonly known as: NICODERM CQ  - dosed in mg/24 hours Place 1 patch (14 mg total) onto the skin daily.   OXYGEN  Inhale 5 L into the lungs continuous.   Stiolto Respimat  2.5-2.5 MCG/ACT Aers Generic drug: Tiotropium Bromide -Olodaterol INHALE 2 PUFFS INTO THE LUNGS DAILY   traZODone  50 MG tablet Commonly known as: DESYREL  Take 1 tablet (50 mg total) by mouth at bedtime.        Follow-up Information     Valma Carwin, MD Follow up.   Specialty: Internal Medicine Contact information: [address] [place] [address] [phone]                Allergies  Allergen Reactions   Clobetasol  Emul Foam W-Moistcr Other (See Comments)    Sore throat, trouble breathing, swallowing,feeling sleepy, more hungry, passing urine more often,and weight loss    Procedures/Studies: US  Abdomen Limited RUQ (LIVER/GB) Result Date: 08/19/2024 EXAM: Right Upper Quadrant Abdominal Ultrasound TECHNIQUE: Real-time ultrasonography of the right upper quadrant of the abdomen was performed. COMPARISON: Abdominal ultrasound dated 07/10/2001. CT of the abdomen and pelvis dated 08/18/2004. CLINICAL HISTORY: Abdominal pain. FINDINGS: LIVER: The liver demonstrates normal echogenicity. No intrahepatic biliary ductal dilatation. No mass. BILIARY SYSTEM: Mild echogenic debris within the gallbladder. No evidence of pericholecystic fluid or wall thickening. No cholelithiasis. Negative sonographic Murphy's sign. Common bile duct is within normal limits measuring 2 mm. OTHER: No right upper quadrant ascites.  IMPRESSION: 1. Mild echogenic debris within the gallbladder. Electronically signed by: Evalene Coho MD 08/19/2024 05:56 AM EDT RP Workstation: [id number]   CT ABDOMEN PELVIS W CONTRAST Result Date: 08/18/2024 CLINICAL DATA:  Acute abdominal pain. EXAM: CT ABDOMEN AND PELVIS WITH CONTRAST TECHNIQUE: Multidetector CT imaging of the abdomen and pelvis was performed using the standard protocol following bolus administration of intravenous contrast. RADIATION DOSE REDUCTION: This exam was performed according to the departmental dose-optimization program which includes automated exposure control, adjustment of the mA and/or kV according to patient size and/or use of iterative reconstruction technique. CONTRAST:  75mL OMNIPAQUE  IOHEXOL  350 MG/ML SOLN COMPARISON:  CT abdomen and pelvis 03/03/2024. pelvic ultrasound 03/04/2024. FINDINGS: Lower chest: Severe emphysematous changes are again seen in both lower lungs. Hepatobiliary: No focal liver abnormality is seen. No gallstones, gallbladder wall thickening, or biliary dilatation. Pancreas: There is diffuse peripancreatic fluid. Pancreatic duct is prominent in size and appears stable measuring up to 4 mm. No enhancing fluid collections identified. Pancreas enhances normally. Spleen: Within normal limits. Adrenals/Urinary Tract:  1 cm cyst is stable in the left kidney. There is no hydronephrosis or perinephric fluid. Adrenal glands and bladder are within normal limits. Stomach/Bowel: There is diffuse colonic wall thickening versus normal under distention. No focal inflammation identified. There is sigmoid colon diverticulosis. Appendix is normal in size. Stomach appears within normal limits. Vascular/Lymphatic: Aortic atherosclerosis. No enlarged abdominal or pelvic lymph nodes. Reproductive: Left adnexal mass appears unchanged, possibly enlarged ovary. Right ovary and uterus are within normal limits. Vaginal cystic area is also unchanged. Other: There is a small amount  of free fluid in the pelvis. There is no focal abdominal wall hernia. Musculoskeletal: There is a sclerotic density and superior portion of the L5 vertebral body, unchanged, possibly a bone island. IMPRESSION: 1. Findings compatible with acute pancreatitis. No evidence for pancreatic necrosis or pseudocyst. 2. Diffuse colonic wall thickening versus normal under distention. Correlate clinically for colitis. 3. Small amount of free fluid in the pelvis. 4. Stable left adnexal mass, possibly enlarged ovary. Please see pelvic ultrasound from 03/12/2024. 5. Aortic atherosclerosis. Aortic Atherosclerosis (ICD10-I70.0). Electronically Signed   By: Greig Pique M.D.   On: 08/18/2024 23:05      Discharge Exam: Vitals:   08/21/24 0808 08/21/24 0937  BP:  (!) 133/95  Pulse:    Resp:    Temp:    SpO2: 97%     General: Pt is alert, awake, not in acute distress Cardiovascular: RRR, S1/S2 +, no edema Respiratory: CTA bilaterally, no wheezing, no rhonchi, no respiratory distress, no conversational dyspnea  Abdominal: Soft, NT, ND, bowel sounds + Extremities: no edema, no cyanosis Psych: Normal mood and affect, stable judgement and insight     The results of significant diagnostics from this hospitalization (including imaging, microbiology, ancillary and laboratory) are listed below for reference.     Microbiology: No results found for this or any previous visit (from the past 240 hours).   Labs: BNP (last 3 results) No results for input(s): BNP in the last 8760 hours. Basic Metabolic Panel: Recent Labs  Lab 08/18/24 2020 08/19/24 0345 08/21/24 0307  NA 136 133* 137  K 3.2* 4.7 2.9*  CL 97* 96* 100  CO2 26 25 29   GLUCOSE 160* 125* 84  BUN 5* 7* <5*  CREATININE 0.61 0.52 0.47  CALCIUM 8.9 8.3* 8.7*   Liver Function Tests: Recent Labs  Lab 08/18/24 2020 08/19/24 0345 08/21/24 0307  AST 98* 84* 43*  ALT 107* 99* 55*  ALKPHOS 121 113 87  BILITOT 1.7* 1.7* 1.1  PROT 7.5 7.2 6.0*   ALBUMIN 3.9 3.8 2.8*   Recent Labs  Lab 08/18/24 2020  LIPASE 1,190*   No results for input(s): AMMONIA in the last 168 hours. CBC: Recent Labs  Lab 08/18/24 2020 08/19/24 0345 08/21/24 0307  WBC 12.9* 12.2* 8.4  NEUTROABS 10.9* 10.7*  --   HGB 15.2* 14.8 12.2  HCT 43.7 43.3 34.6*  MCV 86.9 87.5 87.2  PLT 220 119* 170   Cardiac Enzymes: No results for input(s): CKTOTAL, CKMB, CKMBINDEX, TROPONINI in the last 168 hours. BNP: Invalid input(s): POCBNP CBG: No results for input(s): GLUCAP in the last 168 hours. D-Dimer No results for input(s): DDIMER in the last 72 hours. Hgb A1c Recent Labs    08/19/24 0345  HGBA1C 5.6   Lipid Profile Recent Labs    08/19/24 0617  TRIG 63   Thyroid  function studies No results for input(s): TSH, T4TOTAL, T3FREE, THYROIDAB in the last 72 hours.  Invalid input(s): FREET3 Anemia  work up No results for input(s): VITAMINB12, FOLATE, FERRITIN, TIBC, IRON, RETICCTPCT in the last 72 hours. Urinalysis    Component Value Date/Time   COLORURINE YELLOW 04/08/2024 1447   APPEARANCEUR CLEAR 04/08/2024 1447   LABSPEC 1.010 04/08/2024 1447   PHURINE 5.5 04/08/2024 1447   GLUCOSEU NEGATIVE 04/08/2024 1447   HGBUR NEGATIVE 04/08/2024 1447   BILIRUBINUR NEGATIVE 10/28/2016 1101   KETONESUR NEGATIVE 04/08/2024 1447   PROTEINUR NEGATIVE 04/08/2024 1447   UROBILINOGEN 0.2 06/07/2014 0854   NITRITE NEGATIVE 10/28/2016 1101   LEUKOCYTESUR NEGATIVE 10/28/2016 1101   Sepsis Labs Recent Labs  Lab 08/18/24 2020 08/19/24 0345 08/21/24 0307  WBC 12.9* 12.2* 8.4   Microbiology No results found for this or any previous visit (from the past 240 hours).   Patient was seen and examined on the day of discharge and was found to be in stable condition. Time coordinating discharge: 25 minutes including assessment and coordination of care, as well as examination of the patient.   SIGNED:  Delon Hoe,  DO [facility] 08/21/2024, 1:14 PM

## 2024-08-21 NOTE — Plan of Care (Signed)
 Problem: Education: Goal: Knowledge of General Education information will improve Description: Including pain rating scale, medication(s)/side effects and non-pharmacologic comfort measures 08/21/2024 1252 by Mat Stuard K, RN Outcome: Adequate for Discharge 08/21/2024 1252 by Eryx Zane K, RN Outcome: Adequate for Discharge 08/21/2024 1252 by Florian Dellis POUR, RN Outcome: Adequate for Discharge   Problem: Health Behavior/Discharge Planning: Goal: Ability to manage health-related needs will improve 08/21/2024 1252 by Tifani Dack K, RN Outcome: Adequate for Discharge 08/21/2024 1252 by Elvin Mccartin K, RN Outcome: Adequate for Discharge 08/21/2024 1252 by Lavi Sheehan K, RN Outcome: Adequate for Discharge   Problem: Clinical Measurements: Goal: Ability to maintain clinical measurements within normal limits will improve 08/21/2024 1252 by Cailee Blanke K, RN Outcome: Adequate for Discharge 08/21/2024 1252 by Brayden Betters K, RN Outcome: Adequate for Discharge 08/21/2024 1252 by Aragorn Recker K, RN Outcome: Adequate for Discharge Goal: Will remain free from infection 08/21/2024 1252 by Jakai Onofre K, RN Outcome: Adequate for Discharge 08/21/2024 1252 by Zynasia Burklow K, RN Outcome: Adequate for Discharge 08/21/2024 1252 by Roshunda Keir K, RN Outcome: Adequate for Discharge Goal: Diagnostic test results will improve 08/21/2024 1252 by Yetunde Leis K, RN Outcome: Adequate for Discharge 08/21/2024 1252 by Lavalle Skoda K, RN Outcome: Adequate for Discharge 08/21/2024 1252 by Jaquin Coy K, RN Outcome: Adequate for Discharge Goal: Respiratory complications will improve 08/21/2024 1252 by Lilou Kneip K, RN Outcome: Adequate for Discharge 08/21/2024 1252 by Glena Pharris K, RN Outcome: Adequate for Discharge 08/21/2024 1252 by Ranita Stjulien K, RN Outcome: Adequate for Discharge Goal: Cardiovascular complication will be avoided 08/21/2024 1252 by Febe Champa K, RN Outcome:  Adequate for Discharge 08/21/2024 1252 by Khylie Larmore K, RN Outcome: Adequate for Discharge 08/21/2024 1252 by Kirsten Mckone K, RN Outcome: Adequate for Discharge   Problem: Activity: Goal: Risk for activity intolerance will decrease 08/21/2024 1252 by Liliyana Thobe K, RN Outcome: Adequate for Discharge 08/21/2024 1252 by Florian Dellis POUR, RN Outcome: Adequate for Discharge 08/21/2024 1252 by Florian Dellis POUR, RN Outcome: Adequate for Discharge   Problem: Nutrition: Goal: Adequate nutrition will be maintained 08/21/2024 1252 by Alaiya Martindelcampo K, RN Outcome: Adequate for Discharge 08/21/2024 1252 by Avan Gullett K, RN Outcome: Adequate for Discharge 08/21/2024 1252 by Caitland Porchia K, RN Outcome: Adequate for Discharge   Problem: Coping: Goal: Level of anxiety will decrease 08/21/2024 1252 by Eugene Isadore K, RN Outcome: Adequate for Discharge 08/21/2024 1252 by Brendy Ficek K, RN Outcome: Adequate for Discharge 08/21/2024 1252 by Canary Fister K, RN Outcome: Adequate for Discharge   Problem: Elimination: Goal: Will not experience complications related to bowel motility 08/21/2024 1252 by Corbet Hanley K, RN Outcome: Adequate for Discharge 08/21/2024 1252 by Skyelyn Scruggs K, RN Outcome: Adequate for Discharge 08/21/2024 1252 by Zayden Maffei K, RN Outcome: Adequate for Discharge Goal: Will not experience complications related to urinary retention 08/21/2024 1252 by Noemi Ishmael K, RN Outcome: Adequate for Discharge 08/21/2024 1252 by Amman Bartel K, RN Outcome: Adequate for Discharge 08/21/2024 1252 by Suriyah Vergara K, RN Outcome: Adequate for Discharge   Problem: Pain Managment: Goal: General experience of comfort will improve and/or be controlled 08/21/2024 1252 by Chantele Corado K, RN Outcome: Adequate for Discharge 08/21/2024 1252 by Janyia Guion K, RN Outcome: Adequate for Discharge 08/21/2024 1252 by Leanndra Pember K, RN Outcome: Adequate for Discharge   Problem:  Safety: Goal: Ability to remain free from injury will improve 08/21/2024 1252 by Yazlyn Wentzel K, RN Outcome: Adequate for Discharge 08/21/2024 1252 by  Jean Skow K, RN Outcome: Adequate for Discharge 08/21/2024 1252 by Clifford Benninger K, RN Outcome: Adequate for Discharge   Problem: Skin Integrity: Goal: Risk for impaired skin integrity will decrease 08/21/2024 1252 by Tracer Gutridge K, RN Outcome: Adequate for Discharge 08/21/2024 1252 by Bailee Metter K, RN Outcome: Adequate for Discharge 08/21/2024 1252 by Saretta Dahlem K, RN Outcome: Adequate for Discharge

## 2024-09-24 ENCOUNTER — Encounter (HOSPITAL_COMMUNITY): Payer: Self-pay | Admitting: Psychiatry

## 2024-09-24 ENCOUNTER — Telehealth (HOSPITAL_COMMUNITY): Admitting: Psychiatry

## 2024-09-24 DIAGNOSIS — F331 Major depressive disorder, recurrent, moderate: Secondary | ICD-10-CM | POA: Diagnosis not present

## 2024-09-24 DIAGNOSIS — F411 Generalized anxiety disorder: Secondary | ICD-10-CM

## 2024-09-24 DIAGNOSIS — F17200 Nicotine dependence, unspecified, uncomplicated: Secondary | ICD-10-CM

## 2024-09-24 MED ORDER — FLUOXETINE HCL 20 MG PO CAPS
20.0000 mg | ORAL_CAPSULE | Freq: Every day | ORAL | 2 refills | Status: AC
Start: 2024-09-24 — End: ?

## 2024-09-24 MED ORDER — TRAZODONE HCL 50 MG PO TABS: 50.0000 mg | ORAL_TABLET | Freq: Every day | ORAL | 0 refills | Status: AC

## 2024-09-24 NOTE — Progress Notes (Signed)
 BH MD/PA/NP OP Progress Note  09/24/2024 10:55 AM [name] [name]  MRN:  [id number]  Visit Diagnosis:    ICD-10-CM   1. GAD (generalized anxiety disorder)  F41.1 FLUoxetine  (PROZAC ) 20 MG capsule    traZODone  (DESYREL ) 50 MG tablet    2. Moderate episode of recurrent major depressive disorder (HCC)  F33.1 FLUoxetine  (PROZAC ) 20 MG capsule    traZODone  (DESYREL ) 50 MG tablet    3. Nicotine  dependence, uncomplicated, unspecified nicotine  product type  F17.200       Assessment: [name] [name] is a 63 y.o. female with a history of MDD, COPD who presented to [facility] at [facility] for initial evaluation on 03/12/2023.    At initial evaluation patient reported symptoms of low mood, anhedonia, amotivation, poor sleep, weight loss, some negative self thoughts, and passive SI.  She denied any intent or plan to act on it.  Lavon also endorsed significant symptoms of anxiety including constant worry that she is unable to control, fear of something awful happening, restlessness, difficulty relaxing, and racing thoughts.  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  Patient met criteria for MDD and GAD.  [name] [name] presents for follow-up evaluation. Today, 09/24/24, patient reports moods have been lower lately secondary to increased medical stressors.  Patient was admitted from to the hospital with acute alcoholic pancreatitis.  She still drinks beer several nights a week.  Recommended complete cessation and reviewed the adverse effects it has on both the physical health and the mental health.  Furthermore she had endorsed morning headaches and attributed him to the Prozac .  While it is a potential side effect of Prozac  it is more likely secondary to the alcohol use particularly he has headaches did not occur until nearly 6 months  after starting the increased Prozac  dose and symptoms occur immediately after taking the medicine.  Will change Prozac  dosing to bedtime and monitor.  Patient will follow-up in 3 months.  Plan: - Change Prozac  20 mg with dinner - Continue trazodone  50 mg at bedtime - Recommend nicotine  patch 14 mg daily, information for Tina  quit Line provided - Can consider therapy referral in the future - Crisis resources reviewed - Follow up in 3 months  Chief Complaint:  Chief Complaint  Patient presents with   Follow-up   HPI: On presentation today [name] [name] reports that the last few months have been crazy. She was hospitalized in the interim with acute pancreatitis with suspicion of being alcohol induced.  We discussed this and patient reports she is still drinking a beer every now and then depending on her moods.  She has 1 when she has a more difficult day.  We recommended that she discontinue her alcohol use and explained the adverse effect this can have both mentally and physically.  Mood wise patient reports that she is struggling.  In addition to her typical stressors the pancreatitis and need to change her diet has been difficult.  She is aware she needs to start cutting out fried foods.  On a positive patient reports she is going to be retiring from work after her birthday.  We reviewed the importance of engaging in behavioral activation with the increased free time.  Patient acknowledged that spending time home alone would make her moods worse.  Brainstormed strategies such as nicotine  Anonymous, senior centers, or the  Silver sneakers program.  Patient is taking the Prozac  and trazodone  consistently and questions whether Prozac  is leading to headaches in the morning.  She describes these occurring when she first wakes up and presenting alongside dizziness.  They go away as the day progresses.  Of note the headaches did not start 9 months ago when she first increase the Prozac  to 20 mg dose.   Instead patient noticed these in the past few months.  Given this and the almost immediate side effect she is reporting after taking the Prozac  makes them unlikely to be the offender.  It is more likely there is another cause such as her alcohol use or nicotine  use contributing.  Recommended changing the Prozac  dosing to dinnertime and monitoring headaches.  If headaches transition to dinnertime will consider taper to 10 mg.  If they persist in the mornings however then she should discuss further causes with her PCP in addition to discontinuing the alcohol use.  Past Psychiatric History: Patient had connected with outpatient psychiatrist in the late 1990s named Dr. Freya at the [facility].  She followed with him for about 5 years before discontinuing.  She believes she was prescribed trazodone  at that time and discontinued it after finishing with him.  Patient's PCP had tried bupropion  standard release twice daily. We switched to XL though discontinued due to patient losing weight. On retrial she experienced shakiness.  Currently on Prozac , and trazodone   Patient has significant history of past substance use primarily cocaine and marijuana occurred around 20 to 30 years ago.  She has discontinued that since.  Currently she endorses alcohol use around 1-2 times a week and nicotine  use.  She denies any other substance use.  Past Medical History:  Past Medical History:  Diagnosis Date   Anemia    Anxiety    Arthritis    OSTEOARTHRITIS   Blood dyscrasia    EASY BLEEDER   Colon polyps    COPD (chronic obstructive pulmonary disease) (HCC)    Depression    Osteoporosis    Seasonal allergies    Sickle cell trait    Uterine fibroid     Past Surgical History:  Procedure Laterality Date   CESAREAN SECTION  1980   COLONOSCOPY WITH PROPOFOL  N/A 09/26/2023   Procedure: COLONOSCOPY WITH PROPOFOL ;  Surgeon: Rollin Dover, MD;  Location: [facility];  Service: Gastroenterology;  Laterality:  N/A;   DILATATION & CURETTAGE/HYSTEROSCOPY WITH MYOSURE N/A 09/30/2017   Procedure: DILATATION & CURETTAGE/HYSTEROSCOPY WITH MYOSURE MYOMECTOMY;  Surgeon: Timmie Norris, MD;  Location: [facility];  Service: Gynecology;  Laterality: N/A;  PMB   HEMOSTASIS CLIP PLACEMENT  09/26/2023   Procedure: HEMOSTASIS CLIP PLACEMENT;  Surgeon: Rollin Dover, MD;  Location: [facility];  Service: Gastroenterology;;   POLYPECTOMY  09/26/2023   Procedure: POLYPECTOMY;  Surgeon: Rollin Dover, MD;  Location: [facility];  Service: Gastroenterology;;   Family History:  Family History  Problem Relation Age of Onset   Stomach cancer Father 107   Throat cancer Brother    Heart disease Mother    Breast cancer Neg Hx     Social History:  Social History   Socioeconomic History   Marital status: Single    Spouse name: Not on file   Number of children: 1   Years of education: Not on file   Highest education level: Not on file  Occupational History   Not on file  Tobacco Use   Smoking status: Some Days    Current packs/day: 0.50  Average packs/day: 0.5 packs/day for 42.0 years (21.0 ttl pk-yrs)    Types: Cigarettes   Smokeless tobacco: Never   Tobacco comments:    Smoking about 10 per day.  Trying to quit.  03/02/2024 hfb RN  Vaping Use   Vaping status: Never Used  Substance and Sexual Activity   Alcohol use: Yes    Alcohol/week: 0.0 standard drinks of alcohol    Comment: occasional   Drug use: No   Sexual activity: Not Currently  Other Topics Concern   Not on file  Social History Narrative   ** Merged History Encounter **       Social Drivers of Health   Financial Resource Strain: Not on file  Food Insecurity: No Food Insecurity (08/19/2024)   Hunger Vital Sign    Worried About Running Out of Food in the Last Year: Never true    Ran Out of Food in the Last Year: Never true  Transportation Needs: No Transportation Needs (08/19/2024)   PRAPARE - Administrator, Civil Service  (Medical): No    Lack of Transportation (Non-Medical): No  Physical Activity: Not on file  Stress: Not on file  Social Connections: Unknown (08/19/2024)   Social Connection and Isolation Panel    Frequency of Communication with Friends and Family: More than three times a week    Frequency of Social Gatherings with Friends and Family: More than three times a week    Attends Religious Services: Not on file    Active Member of Clubs or Organizations: Yes    Attends Banker Meetings: More than 4 times per year    Marital Status: Never married    Allergies:  Allergies  Allergen Reactions   Clobetasol  Emul Foam W-Moistcr Other (See Comments)    Sore throat, trouble breathing, swallowing,feeling sleepy, more hungry, passing urine more often,and weight loss    Current Medications: Current Outpatient Medications  Medication Sig Dispense Refill   acetaminophen  (TYLENOL ) 325 MG tablet Take 325-650 mg by mouth daily as needed for mild pain (pain score 1-3) or moderate pain (pain score 4-6).     albuterol  (PROVENTIL ) (2.5 MG/3ML) 0.083% nebulizer solution Take 3 mLs (2.5 mg total) by nebulization every 6 (six) hours as needed for wheezing or shortness of breath. 360 mL 12   albuterol  (VENTOLIN  HFA) 108 (90 Base) MCG/ACT inhaler INHALE 2 PUFFS INTO THE LUNGS EVERY 6 HOURS AS NEEDED FOR WHEEZING OR SHORTNESS OF BREATH 6.7 g 3   amLODipine  (NORVASC ) 5 MG tablet Take 5 mg by mouth every morning.     atorvastatin (LIPITOR) 20 MG tablet Take 20 mg by mouth daily.     FLUoxetine  (PROZAC ) 20 MG capsule Take 1 capsule (20 mg total) by mouth daily. 30 capsule 2   Multiple Vitamin (MULTIVITAMIN WITH MINERALS) TABS tablet Take 1 tablet by mouth every morning.     nicotine  (NICODERM CQ  - DOSED IN MG/24 HOURS) 14 mg/24hr patch Place 1 patch (14 mg total) onto the skin daily. (Patient not taking: Reported on 08/19/2024) 28 patch 2   OXYGEN  Inhale 5 L into the lungs continuous.     STIOLTO RESPIMAT   2.5-2.5 MCG/ACT AERS INHALE 2 PUFFS INTO THE LUNGS DAILY 4 g 11   traZODone  (DESYREL ) 50 MG tablet Take 1 tablet (50 mg total) by mouth at bedtime. 90 tablet 0   Vibegron  (GEMTESA ) 75 MG TABS Take 1 tablet (75 mg total) by mouth at bedtime. 30 tablet 11   No current  facility-administered medications for this visit.     Psychiatric Specialty Exam: Review of Systems  There were no vitals taken for this visit.There is no height or weight on file to calculate BMI.  General Appearance: Fairly Groomed  Eye Contact:  Good  Speech:  Clear and Coherent  Volume:  Normal  Mood:  Euthymic  Affect:  Congruent  Thought Process:  Coherent  Orientation:  Full (Time, Place, and Person)  Thought Content: Logical   Suicidal Thoughts:  No  Homicidal Thoughts:  No  Memory:  Immediate;   Fair  Judgement:  Fair  Insight:  Fair  Psychomotor Activity:  Normal  Concentration:  Concentration: Fair  Recall:  Fair  Fund of Knowledge: Fair  Language: Good  Akathisia:  NA    AIMS (if indicated): not done  Assets:  Communication Skills Desire for Improvement Financial Resources/Insurance  ADL's:  Intact  Cognition: WNL  Sleep:  Good   Metabolic Disorder Labs: Lab Results  Component Value Date   HGBA1C 5.6 08/19/2024   MPG 114.02 08/19/2024   No results found for: PROLACTIN Lab Results  Component Value Date   TRIG 63 08/19/2024   Lab Results  Component Value Date   TSH 0.707 08/26/2023    Therapeutic Level Labs: No results found for: LITHIUM No results found for: VALPROATE No results found for: CBMZ   Screenings: GAD-7    Flowsheet Row Office Visit [facility]12/20[facility]  Total GAD-7 Score 12   PHQ2-9    Flowsheet Row Office Visit from 04/08/2024 in [facility] Office Visit from 03/12/2023 in [facility]  PHQ-2 Total Score 0 2  PHQ-9 Total Score -- 10    Flowsheet Row ED to Hosp-Admission (Discharged) from 08/18/2024 in [facility]  SURGICAL ED from 03/03/2024 in [facility] Admission (Discharged) from 09/26/2023 in [facility]  C-SSRS RISK CATEGORY No Risk No Risk No Risk    Collaboration of Care: Collaboration of Care: Medication Management AEB medication prescription and Other provider involved in patient's care AEB hospital chart review  Patient/Guardian was advised Release of Information must be obtained prior to any record release in order to collaborate their care with an outside provider. Patient/Guardian was advised if they have not already done so to contact the registration department to sign all necessary forms in order for us  to release information regarding their care.   Consent: Patient/Guardian gives verbal consent for treatment and assignment of benefits for services provided during this visit. Patient/Guardian expressed understanding and agreed to proceed.    Arvella CHRISTELLA Finder, MD 09/24/2024, 10:55 AM   Virtual Visit via Video Note  I connected with [facility] on 09/24/24 at 10:30 AM EDT by a video enabled telemedicine application and verified that I am speaking with the correct person using two identifiers.  Location: Patient: Home Provider: Home Office   I discussed the limitations of evaluation and management by telemedicine and the availability of in person appointments. The patient expressed understanding and agreed to proceed.   I discussed the assessment and treatment plan with the patient. The patient was provided an opportunity to ask questions and all were answered. The patient agreed with the plan and demonstrated an understanding of the instructions.   The patient was advised to call back or seek an in-person evaluation if the symptoms worsen or if the condition fails to  improve as anticipated.  I provided 15  minutes of non-face-to-face time during this encounter.   Arvella CHRISTELLA Finder, MD

## 2024-10-05 ENCOUNTER — Encounter: Payer: Self-pay | Admitting: Obstetrics and Gynecology

## 2024-10-05 ENCOUNTER — Ambulatory Visit (INDEPENDENT_AMBULATORY_CARE_PROVIDER_SITE_OTHER)

## 2024-10-05 ENCOUNTER — Ambulatory Visit: Admitting: Obstetrics and Gynecology

## 2024-10-05 VITALS — BP 122/72 | HR 72 | Temp 98.2°F | Wt 108.0 lb

## 2024-10-05 DIAGNOSIS — N9489 Other specified conditions associated with female genital organs and menstrual cycle: Secondary | ICD-10-CM

## 2024-10-05 DIAGNOSIS — D251 Intramural leiomyoma of uterus: Secondary | ICD-10-CM | POA: Diagnosis not present

## 2024-10-05 DIAGNOSIS — N898 Other specified noninflammatory disorders of vagina: Secondary | ICD-10-CM | POA: Insufficient documentation

## 2024-10-05 DIAGNOSIS — R1900 Intra-abdominal and pelvic swelling, mass and lump, unspecified site: Secondary | ICD-10-CM | POA: Diagnosis not present

## 2024-10-05 NOTE — Assessment & Plan Note (Addendum)
 Stable 3 cm left adnexal mass since 2017. Chronic vaginal cyst since 2008. Small uterine fibroid.  04/08/24 Normal tumor markers  Reviewed TVUS with patient today with stable uterine fibroid, left adnexal mass and vaginal cysts. Discussed that she has a chronic left adnexal mass, vaginal cysts and uterine fibroids.  Given minimal change over 8 years and normal tumor markers, this is suggestive of a benign process.  Discussed option for observation versus continued surveillance. She elected proceed with continued surveillance. Recommend annual ultrasound at the time of annual exam

## 2024-10-05 NOTE — Progress Notes (Signed)
 63 y.o. G10P1001 female with chronic respiratory failure with hypoxia requiring oxygen , tobacco use, OAB here for f/u of chronic adnexal mass (present since 2017) and vaginal cyst (2008). Single. PCP: Valma Carwin, MD  She denies any pelvic pain or vaginal bleeding.  She has since been admitted to the hospital again for pancreatitis on 08/18/2024.  Admitted to hospital 03/03/24 for acute pancreatitis. Ct scan showed left pelvic mass and chronic vaginal cyst. Notes 20lb wt loss over past year.  Vaginal odor, urinary frequency. No PMB or pelvic pain.  03/03/24: IMPRESSION: 1. Acute pancreatitis. No peripancreatic collection or necrosis. 2. Mild pancreatic ductal dilatation of 4 mm, nonspecific. 3. Colonic diverticulosis without diverticulitis. 4. Questionable but not definite soft tissue lesion in the left adnexa measuring 3.5 cm. Recommend further evaluation with pelvic ultrasound. 5. Chronic vaginal cyst which is increased from remote 2008 exam.  03/12/24 TVUS: IMPRESSION: 1. Redemonstrated, bilobed soft tissue mass in the left adnexa measuring 3.8 x 2 x 3.1 cm. Overall, given the relative stability since 2017, this is likely benign. 2. Similar appearance of a vaginal cyst measuring 2.8 x 2 x 2.3 cm.  3. Anterior uterine body fibroid, measuring 1.5 x 1.4 x 1.5 cm. No endometrial thickening.  Chart review reveals 11/06/16 CT ab pelvis with  3.3 cm in the left pelvis interposed between the colon and the left adnexa. Left pelvic mass has been stable over 5yr.     Component Value Date/Time   DIAGPAP  04/08/2024 1442    - Negative for intraepithelial lesion or malignancy (NILM)   DIAGPAP  09/19/2017 0000    NEGATIVE FOR INTRAEPITHELIAL LESIONS OR MALIGNANCY.   DIAGPAP  09/19/2017 0000    A LETTER WAS SENT TO THE PATIENT INFORMING HER OF THE ABOVE RESULTS.   HPVHIGH Negative 04/08/2024 1442   ADEQPAP  04/08/2024 1442    Satisfactory for evaluation; transformation zone component PRESENT.    ADEQPAP  09/19/2017 0000    Satisfactory for evaluation  endocervical/transformation zone component PRESENT.    GYN HISTORY: Prior C-section  OB History  Gravida Para Term Preterm AB Living  1 1 1  0 0 1  SAB IAB Ectopic Multiple Live Births  0 0 0 0 1    # Outcome Date GA Lbr Len/2nd Weight Sex Type Anes PTL Lv  1 Term      CS-Unspec   LIV    Past Medical History:  Diagnosis Date   Anemia    Anxiety    Arthritis    OSTEOARTHRITIS   Blood dyscrasia    EASY BLEEDER   Colon polyps    COPD (chronic obstructive pulmonary disease) (HCC)    Depression    Osteoporosis    Seasonal allergies    Sickle cell trait    Uterine fibroid     Past Surgical History:  Procedure Laterality Date   CESAREAN SECTION  1980   COLONOSCOPY WITH PROPOFOL  N/A 09/26/2023   Procedure: COLONOSCOPY WITH PROPOFOL ;  Surgeon: Rollin Dover, MD;  Location: [facility];  Service: Gastroenterology;  Laterality: N/A;   DILATATION & CURETTAGE/HYSTEROSCOPY WITH MYOSURE N/A 09/30/2017   Procedure: DILATATION & CURETTAGE/HYSTEROSCOPY WITH MYOSURE MYOMECTOMY;  Surgeon: Timmie Norris, MD;  Location: [facility];  Service: Gynecology;  Laterality: N/A;  PMB   HEMOSTASIS CLIP PLACEMENT  09/26/2023   Procedure: HEMOSTASIS CLIP PLACEMENT;  Surgeon: Rollin Dover, MD;  Location: [facility];  Service: Gastroenterology;;   POLYPECTOMY  09/26/2023   Procedure: POLYPECTOMY;  Surgeon: Rollin Dover, MD;  Location:  [facility];  Service: Gastroenterology;;    Current Outpatient Medications on File Prior to Visit  Medication Sig Dispense Refill   acetaminophen  (TYLENOL ) 325 MG tablet Take 325-650 mg by mouth daily as needed for mild pain (pain score 1-3) or moderate pain (pain score 4-6).     albuterol  (PROVENTIL ) (2.5 MG/3ML) 0.083% nebulizer solution Take 3 mLs (2.5 mg total) by nebulization every 6 (six) hours as needed for wheezing or shortness of breath. 360 mL 12   albuterol  (VENTOLIN  HFA) 108 (90 Base) MCG/ACT  inhaler INHALE 2 PUFFS INTO THE LUNGS EVERY 6 HOURS AS NEEDED FOR WHEEZING OR SHORTNESS OF BREATH 6.7 g 3   amLODipine  (NORVASC ) 5 MG tablet Take 5 mg by mouth every morning.     atorvastatin (LIPITOR) 20 MG tablet Take 20 mg by mouth daily.     FLUoxetine  (PROZAC ) 20 MG capsule Take 1 capsule (20 mg total) by mouth daily. 30 capsule 2   Multiple Vitamin (MULTIVITAMIN WITH MINERALS) TABS tablet Take 1 tablet by mouth every morning.     OXYGEN  Inhale 5 L into the lungs continuous.     STIOLTO RESPIMAT  2.5-2.5 MCG/ACT AERS INHALE 2 PUFFS INTO THE LUNGS DAILY 4 g 11   traZODone  (DESYREL ) 50 MG tablet Take 1 tablet (50 mg total) by mouth at bedtime. 90 tablet 0   Vibegron  (GEMTESA ) 75 MG TABS Take 1 tablet (75 mg total) by mouth at bedtime. 30 tablet 11   No current facility-administered medications on file prior to visit.    Allergies  Allergen Reactions   Clobetasol  Emul Foam W-Moistcr Other (See Comments)    Sore throat, trouble breathing, swallowing,feeling sleepy, more hungry, passing urine more often,and weight loss      PE Today's Vitals   10/05/24 1000 10/05/24 1052  BP: 122/72   Pulse: 72   Temp: 98.2 F (36.8 C)   TempSrc: Oral   SpO2: (!) 89% 91%  Weight: 108 lb (49 kg)    Body mass index is 18.54 kg/m.  Physical Exam Vitals reviewed.  Constitutional:      General: She is not in acute distress.    Appearance: Normal appearance.  HENT:     Head: Normocephalic and atraumatic.     Nose: Nose normal.  Eyes:     Extraocular Movements: Extraocular movements intact.     Conjunctiva/sclera: Conjunctivae normal.  Pulmonary:     Effort: Pulmonary effort is normal.  Musculoskeletal:        General: Normal range of motion.     Cervical back: Normal range of motion.  Neurological:     General: No focal deficit present.     Mental Status: She is alert.  Psychiatric:        Mood and Affect: Mood normal.        Behavior: Behavior normal.     10/05/24  TVUS: Indications: Adnexal mass Comparison: 03/12/2024  Findings:   Uterus: 6.7 x 3.8 x 2.4 cm, anteverted uterus.  13 mm intramural fibroid noted. Endometrial thickness: 3 mm. Left ovary: 1.6 x 1.0 x 0.9 cm, normal appearing.  There is a complex mass adjacent to the left ovary that measures 3.5 x 2.1 x 2.9 cm.  Vascularity was noted. Right ovary: 1.7 x 1.1 x 1.0 cm, normal-appearing. Other findings: 2 vaginal cyst present, measuring 2.5 x 2.0 and 1.4 x 0.8 cm. Small amount of simple clear fluid noted in the cul-de-sac.  Impression:  Stable intramural fibroid Stable complex left adnexal mass, measures  3.5 cm today. 2 vaginal cyst on today's exam, measuring 2.5 cm 1.4 cm.  This is consistent with a Bartholin's gland cyst or Gartner's duct cyst.  Vera LULLA Pa, MD    Assessment and Plan:        Adnexal mass Assessment & Plan: Stable 3 cm left adnexal mass since 2017. Chronic vaginal cyst since 2008. Small uterine fibroid.  04/08/24 Normal tumor markers  Reviewed TVUS with patient today with stable uterine fibroid, left adnexal mass and vaginal cysts. Discussed that she has a chronic left adnexal mass, vaginal cysts and uterine fibroids.  Given minimal change over 8 years and normal tumor markers, this is suggestive of a benign process.  Discussed option for observation versus continued surveillance. She elected proceed with continued surveillance. Recommend annual ultrasound at the time of annual exam    Intramural leiomyoma of uterus -     US  PELVIS TRANSVAGINAL NON-OB (TV ONLY); Future  Vaginal cysts   As above  Vera LULLA Pa, MD

## 2024-10-06 ENCOUNTER — Telehealth: Payer: Self-pay

## 2024-10-06 NOTE — Telephone Encounter (Signed)
-----   Message from [place] S sent at 10/06/2024  8:59 AM EDT ----- Regarding: ? Please clarify-per check out comment Return in about 6 months (around 04/05/2025) for Annual, TVUS. Per notes Recommend annual ultrasound at the time of annual exam  If we schedule annual exam for April 2026 US  will only be 6 months. Yearly annual exam and yearly US  to not coincide.

## 2024-10-06 NOTE — Telephone Encounter (Signed)
 Message forwarded to provider for carnification.

## 2024-10-18 ENCOUNTER — Encounter (HOSPITAL_COMMUNITY): Payer: Self-pay | Admitting: Pharmacy Technician

## 2024-10-18 ENCOUNTER — Emergency Department (HOSPITAL_COMMUNITY)
Admission: EM | Admit: 2024-10-18 | Discharge: 2024-10-18 | Disposition: A | Discharge status: Home / Self Care | Attending: Emergency Medicine | Admitting: Emergency Medicine

## 2024-10-18 DIAGNOSIS — D72829 Elevated white blood cell count, unspecified: Secondary | ICD-10-CM | POA: Diagnosis not present

## 2024-10-18 DIAGNOSIS — R112 Nausea with vomiting, unspecified: Secondary | ICD-10-CM | POA: Diagnosis present

## 2024-10-18 DIAGNOSIS — K859 Acute pancreatitis without necrosis or infection, unspecified: Secondary | ICD-10-CM | POA: Insufficient documentation

## 2024-10-18 DIAGNOSIS — J449 Chronic obstructive pulmonary disease, unspecified: Secondary | ICD-10-CM | POA: Diagnosis not present

## 2024-10-18 LAB — COMPREHENSIVE METABOLIC PANEL WITH GFR
ALT: 53 U/L — ABNORMAL HIGH (ref 0–44)
AST: 64 U/L — ABNORMAL HIGH (ref 15–41)
Albumin: 3.8 g/dL (ref 3.5–5.0)
Alkaline Phosphatase: 84 U/L (ref 38–126)
Anion gap: 12 (ref 5–15)
BUN: 7 mg/dL — ABNORMAL LOW (ref 8–23)
CO2: 24 mmol/L (ref 22–32)
Calcium: 8.9 mg/dL (ref 8.9–10.3)
Chloride: 101 mmol/L (ref 98–111)
Creatinine, Ser: 0.66 mg/dL (ref 0.44–1.00)
GFR, Estimated: >60 mL/min (ref >60)
Glucose, Bld: 113 mg/dL — ABNORMAL HIGH (ref 70–99)
Potassium: 4 mmol/L (ref 3.5–5.1)
Sodium: 137 mmol/L (ref 135–145)
Total Bilirubin: 0.9 mg/dL (ref 0.0–1.2)
Total Protein: 7.7 g/dL (ref 6.5–8.1)

## 2024-10-18 LAB — CBC
HCT: 44.6 % (ref 36.0–46.0)
Hemoglobin: 15.5 g/dL — ABNORMAL HIGH (ref 12.0–15.0)
MCH: 29.6 pg (ref 26.0–34.0)
MCHC: 34.8 g/dL (ref 30.0–36.0)
MCV: 85.3 fL (ref 80.0–100.0)
Platelets: 276 K/uL (ref 150–400)
RBC: 5.23 MIL/uL — ABNORMAL HIGH (ref 3.87–5.11)
RDW: 14.6 % (ref 11.5–15.5)
WBC: 12.1 K/uL — ABNORMAL HIGH (ref 4.0–10.5)
nRBC: 0 % (ref 0.0–0.2)

## 2024-10-18 LAB — LIPASE, BLOOD: Lipase: 1851 U/L — ABNORMAL HIGH (ref 11–51)

## 2024-10-18 MED ORDER — SODIUM CHLORIDE 0.9 % IV BOLUS
500.0000 mL | Freq: Once | INTRAVENOUS | Status: AC
  Administered 2024-10-18: 500 mL via INTRAVENOUS

## 2024-10-18 MED ORDER — ONDANSETRON 4 MG PO TBDP
4.0000 mg | ORAL_TABLET | Freq: Three times a day (TID) | ORAL | 0 refills | Status: AC | PRN
Start: 2024-10-18 — End: ?

## 2024-10-18 MED ORDER — OXYCODONE-ACETAMINOPHEN 5-325 MG PO TABS: 1.0000 | ORAL_TABLET | Freq: Four times a day (QID) | ORAL | 0 refills | Status: AC | PRN

## 2024-10-18 MED ORDER — HYDROMORPHONE HCL 1 MG/ML IJ SOLN
0.5000 mg | Freq: Once | INTRAMUSCULAR | Status: AC
  Administered 2024-10-18: 0.5 mg via INTRAVENOUS
  Filled 2024-10-18: qty 1

## 2024-10-18 MED ORDER — ONDANSETRON HCL 4 MG/2ML IJ SOLN
4.0000 mg | Freq: Once | INTRAMUSCULAR | Status: AC
  Administered 2024-10-18: 4 mg via INTRAVENOUS
  Filled 2024-10-18: qty 2

## 2024-10-18 NOTE — ED Notes (Signed)
 Pt stated that she came into the ED today for ABD pain that started this morning and has gotten worse. C/o 8/10 pain in bilateral upper quadrants. Tenderness upon palpation and guarding.   IV access was established in right FA. Antiemetic and pain medication were administered. Fluids were initiated.

## 2024-10-18 NOTE — ED Notes (Signed)
Bedside commode placed in the room

## 2024-10-18 NOTE — ED Provider Notes (Signed)
 [place] EMERGENCY DEPARTMENT AT [facility] Provider Note   CSN: [id number] Arrival date & time: 10/18/24  9148     Patient presents with: Nausea and Emesis   [name] [name] is a 63 y.o. female.    Emesis Patient was with abdominal pain nausea and vomiting.  Began today.  History of pancreatitis and states this feels the same.  No diarrhea.  No fevers.  States she did not drink alcohol last night but has been drinking some overall.    Past Medical History:  Diagnosis Date   Anemia    Anxiety    Arthritis    OSTEOARTHRITIS   Blood dyscrasia    EASY BLEEDER   Colon polyps    COPD (chronic obstructive pulmonary disease) (HCC)    Depression    Osteoporosis    Seasonal allergies    Sickle cell trait    Uterine fibroid     Prior to Admission medications   Medication Sig Start Date End Date Taking? Authorizing Provider  ondansetron  (ZOFRAN -ODT) 4 MG disintegrating tablet Take 1 tablet (4 mg total) by mouth every 8 (eight) hours as needed. 10/18/24  Yes Patsey Lot, MD  oxyCODONE -acetaminophen  (PERCOCET/ROXICET) 5-325 MG tablet Take 1 tablet by mouth every 6 (six) hours as needed for severe pain (pain score 7-10). 10/18/24  Yes Patsey Lot, MD  acetaminophen  (TYLENOL ) 325 MG tablet Take 325-650 mg by mouth daily as needed for mild pain (pain score 1-3) or moderate pain (pain score 4-6).    [provider]  albuterol  (PROVENTIL ) (2.5 MG/3ML) 0.083% nebulizer solution Take 3 mLs (2.5 mg total) by nebulization every 6 (six) hours as needed for wheezing or shortness of breath. 02/06/16   Alaine Vicenta NOVAK, MD  albuterol  (VENTOLIN  HFA) 108 (90 Base) MCG/ACT inhaler INHALE 2 PUFFS INTO THE LUNGS EVERY 6 HOURS AS NEEDED FOR WHEEZING OR SHORTNESS OF BREATH 12/24/23   Darlean Ozell NOVAK, MD  amLODipine  (NORVASC ) 5 MG tablet Take 5 mg by mouth every morning. 05/11/20   [provider]  atorvastatin (LIPITOR) 20 MG tablet Take 20 mg by mouth daily. 07/13/24    [provider]  FLUoxetine  (PROZAC ) 20 MG capsule Take 1 capsule (20 mg total) by mouth daily. 09/24/24   Carvin Arvella HERO, MD  Multiple Vitamin (MULTIVITAMIN WITH MINERALS) TABS tablet Take 1 tablet by mouth every morning.    [provider]  OXYGEN  Inhale 5 L into the lungs continuous.    [provider]  STIOLTO RESPIMAT  2.5-2.5 MCG/ACT AERS INHALE 2 PUFFS INTO THE LUNGS DAILY 04/12/24   Darlean Ozell NOVAK, MD  traZODone  (DESYREL ) 50 MG tablet Take 1 tablet (50 mg total) by mouth at bedtime. 09/24/24   Carvin Arvella HERO, MD  Vibegron  (GEMTESA ) 75 MG TABS Take 1 tablet (75 mg total) by mouth at bedtime. 05/13/24   Dallie Vera GAILS, MD    Allergies: Clobetasol  emul foam w-moistcr    Review of Systems  Gastrointestinal:  Positive for vomiting.    Updated Vital Signs BP 106/66   Pulse 90   Temp 97.8 F (36.6 C)   Resp (!) 24   SpO2 100%   Physical Exam Vitals and nursing note reviewed.  Cardiovascular:     Rate and Rhythm: Regular rhythm.  Abdominal:     Tenderness: There is abdominal tenderness.     Comments: Upper abdominal tenderness without hernia palpated.  Neurological:     Mental Status: She is alert. Mental status is at baseline.     (  all labs ordered are listed, but only abnormal results are displayed) Labs Reviewed  LIPASE, BLOOD - Abnormal; Notable for the following components:      Result Value   Lipase 1,851 (*)    All other components within normal limits  COMPREHENSIVE METABOLIC PANEL WITH GFR - Abnormal; Notable for the following components:   Glucose, Bld 113 (*)    BUN 7 (*)    AST 64 (*)    ALT 53 (*)    All other components within normal limits  CBC - Abnormal; Notable for the following components:   WBC 12.1 (*)    RBC 5.23 (*)    Hemoglobin 15.5 (*)    All other components within normal limits    EKG: None  Radiology: No results found.   Procedures   Medications Ordered in the ED  sodium chloride  0.9 % bolus 500  mL (0 mLs Intravenous Stopped 10/18/24 1116)  HYDROmorphone  (DILAUDID ) injection 0.5 mg (0.5 mg Intravenous Given 10/18/24 0932)  ondansetron  (ZOFRAN ) injection 4 mg (4 mg Intravenous Given 10/18/24 0930)  sodium chloride  0.9 % bolus 500 mL ( Intravenous Restarted 10/18/24 1118)                                    Medical Decision Making Amount and/or Complexity of Data Reviewed Labs: ordered.  Risk Prescription drug management.   Patient abdominal pain.  Nausea and vomiting.  History of pancreatitis.  Differential diagnosis includes other causes such as gastroenteritis but I think most likely this is the pancreatitis which she had had previously.  Will get blood work and treat symptomatically at this time.  Reviewed previous discharge note from admission for similar symptoms.  Lipase is elevated.  Consistent with her pancreatitis.  Feeling better after treatment.  Given fluid bolus.  Patient is eager to go home.  Will treat with pain meds and antiemetics.  Follow-up with PCP as needed.  Had been instructed to not drink alcohol at all.     Final diagnoses:  Acute pancreatitis, unspecified complication status, unspecified pancreatitis type    ED Discharge Orders          Ordered    ondansetron  (ZOFRAN -ODT) 4 MG disintegrating tablet  Every 8 hours PRN        10/18/24 1149    oxyCODONE -acetaminophen  (PERCOCET/ROXICET) 5-325 MG tablet  Every 6 hours PRN        10/18/24 1149               Patsey Lot, MD 10/18/24 1150

## 2024-10-18 NOTE — ED Triage Notes (Signed)
 Pt states she has pancreatitis and needs to be treated, pt is vomiting and retching on arrival.

## 2024-12-20 ENCOUNTER — Inpatient Hospital Stay
Admission: RE | Admit: 2024-12-20 | Discharge: 2024-12-20 | Disposition: A | Source: Ambulatory Visit | Attending: Acute Care | Admitting: Acute Care

## 2024-12-20 DIAGNOSIS — F17210 Nicotine dependence, cigarettes, uncomplicated: Secondary | ICD-10-CM

## 2024-12-20 DIAGNOSIS — Z122 Encounter for screening for malignant neoplasm of respiratory organs: Secondary | ICD-10-CM

## 2024-12-20 DIAGNOSIS — Z87891 Personal history of nicotine dependence: Secondary | ICD-10-CM

## 2024-12-20 NOTE — Progress Notes (Deleted)
 BH MD/PA/NP OP Progress Note  12/20/2024 2:01 PM [name] [name]  MRN:  [id number]  Visit Diagnosis:  No diagnosis found.   Assessment: [name] [name] is a 64 y.o. female with a history of MDD, COPD who presented to [facility] for initial evaluation on 03/12/2023.    At initial evaluation patient reported symptoms of low mood, anhedonia, amotivation, poor sleep, weight loss, some negative self thoughts, and passive SI.  She denied any intent or plan to act on it.  Iya also endorsed significant symptoms of anxiety including constant worry that she is unable to control, fear of something awful happening, restlessness, difficulty relaxing, and racing thoughts.  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  Patient met criteria for MDD and GAD.  [name] [name] presents for follow-up evaluation. Today, 12/20/2024, patient    moods have been lower lately secondary to increased medical stressors.  Patient was admitted from to the hospital with acute alcoholic pancreatitis.  She still drinks beer several nights a week.  Recommended complete cessation and reviewed the adverse effects it has on both the physical health and the mental health.  Furthermore she had endorsed morning headaches and attributed him to the Prozac .  While it is a potential side effect of Prozac  it is more likely secondary to the alcohol use particularly he has headaches did not occur until nearly 6 months after starting the increased Prozac  dose and symptoms occur immediately after taking the medicine.  Will change Prozac  dosing to bedtime and monitor.  Patient will follow-up in 3 months.  Plan: - Change Prozac  20 mg with dinner - Continue trazodone  50 mg at bedtime - Recommend nicotine  patch 14 mg daily, information for [place]  quit Line  provided - Can consider therapy referral in the future - Crisis resources reviewed - Follow up in 3 months  Chief Complaint:  No chief complaint on file.  HPI: On presentation today [name] [name] reports    the last few months have been crazy. She was hospitalized in the interim with acute pancreatitis with suspicion of being alcohol induced.  We discussed this and patient reports she is still drinking a beer every now and then depending on her moods.  She has 1 when she has a more difficult day.  We recommended that she discontinue her alcohol use and explained the adverse effect this can have both mentally and physically.  Mood wise patient reports that she is struggling.  In addition to her typical stressors the pancreatitis and need to change her diet has been difficult.  She is aware she needs to start cutting out fried foods.  On a positive patient reports she is going to be retiring from work after her birthday.  We reviewed the importance of engaging in behavioral activation with the increased free time.  Patient acknowledged that spending time home alone would make her moods worse.  Brainstormed strategies such as nicotine  Anonymous, senior centers, or the Silver sneakers program.  Patient is taking the Prozac  and trazodone  consistently and questions whether Prozac  is leading to headaches in the morning.  She describes these occurring when she first wakes up and presenting alongside dizziness.  They go away as the day progresses.  Of note the headaches did not start 9 months ago when she first increase the Prozac  to 20 mg dose.  Instead patient noticed these in the past few months.  Given this and the almost immediate side effect she is reporting after taking the Prozac  makes them unlikely to be the offender.  It is more likely there is another cause such as her alcohol use or nicotine  use contributing.  Recommended changing the Prozac  dosing to dinnertime and monitoring headaches.  If headaches  transition to dinnertime will consider taper to 10 mg.  If they persist in the mornings however then she should discuss further causes with her PCP in addition to discontinuing the alcohol use.  Past Psychiatric History: Patient had connected with outpatient psychiatrist in the late 1990s named Dr. Freya at the [facility].  She followed with him for about 5 years before discontinuing.  She believes she was prescribed trazodone  at that time and discontinued it after finishing with him.  Patient's PCP had tried bupropion  standard release twice daily. We switched to XL though discontinued due to patient losing weight. On retrial she experienced shakiness.  Currently on Prozac , and trazodone   Patient has significant history of past substance use primarily cocaine and marijuana occurred around 20 to 30 years ago.  She has discontinued that since.  Currently she endorses alcohol use around 1-2 times a week and nicotine  use.  She denies any other substance use.  Past Medical History:  Past Medical History:  Diagnosis Date   Anemia    Anxiety    Arthritis    OSTEOARTHRITIS   Blood dyscrasia    EASY BLEEDER   Colon polyps    COPD (chronic obstructive pulmonary disease) (HCC)    Depression    Osteoporosis    Seasonal allergies    Sickle cell trait    Uterine fibroid     Past Surgical History:  Procedure Laterality Date   CESAREAN SECTION  1980   COLONOSCOPY WITH PROPOFOL  N/A 09/26/2023   Procedure: COLONOSCOPY WITH PROPOFOL ;  Surgeon: Rollin Dover, MD;  Location: [facility];  Service: Gastroenterology;  Laterality: N/A;   DILATATION & CURETTAGE/HYSTEROSCOPY WITH MYOSURE N/A 09/30/2017   Procedure: DILATATION & CURETTAGE/HYSTEROSCOPY WITH MYOSURE MYOMECTOMY;  Surgeon: Timmie Norris, MD;  Location: [facility];  Service: Gynecology;  Laterality: N/A;  PMB   HEMOSTASIS CLIP PLACEMENT  09/26/2023   Procedure: HEMOSTASIS CLIP PLACEMENT;  Surgeon: Rollin Dover, MD;  Location: [facility];   Service: Gastroenterology;;   POLYPECTOMY  09/26/2023   Procedure: POLYPECTOMY;  Surgeon: Rollin Dover, MD;  Location: [facility];  Service: Gastroenterology;;   Family History:  Family History  Problem Relation Age of Onset   Stomach cancer Father 72   Throat cancer Brother    Heart disease Mother    Breast cancer Neg Hx     Social History:  Social History   Socioeconomic History   Marital status: Single    Spouse name: Not on file   Number of children: 1   Years of education: Not on file   Highest education level: Not on file  Occupational History   Not on file  Tobacco Use   Smoking status: Some Days    Current packs/day: 0.50    Average packs/day: 0.5 packs/day for 42.0 years (21.0 ttl pk-yrs)    Types: Cigarettes   Smokeless tobacco: Never   Tobacco comments:    Smoking about 10 per day.  Trying to quit.  03/02/2024 hfb RN  Vaping Use   Vaping status: Never Used  Substance and Sexual Activity   Alcohol use: Yes    Alcohol/week: 0.0  standard drinks of alcohol    Comment: occasional   Drug use: No   Sexual activity: Not Currently  Other Topics Concern   Not on file  Social History Narrative   ** Merged History Encounter **       Social Drivers of Health   Tobacco Use: High Risk (10/18/2024)   Patient History    Smoking Tobacco Use: Some Days    Smokeless Tobacco Use: Never    Passive Exposure: Not on file  Financial Resource Strain: Not on file  Food Insecurity: No Food Insecurity (08/19/2024)   Epic    Worried About Programme Researcher, Broadcasting/film/video in the Last Year: Never true    Ran Out of Food in the Last Year: Never true  Transportation Needs: No Transportation Needs (08/19/2024)   Epic    Lack of Transportation (Medical): No    Lack of Transportation (Non-Medical): No  Physical Activity: Not on file  Stress: Not on file  Social Connections: Unknown (08/19/2024)   Social Connection and Isolation Panel    Frequency of Communication with Friends and Family: More  than three times a week    Frequency of Social Gatherings with Friends and Family: More than three times a week    Attends Religious Services: Not on file    Active Member of Clubs or Organizations: Yes    Attends Banker Meetings: More than 4 times per year    Marital Status: Never married  Depression (PHQ2-9): Low Risk (04/08/2024)   Depression (PHQ2-9)    PHQ-2 Score: 0  Alcohol Screen: Not on file  Housing: Unknown (08/19/2024)   Epic    Unable to Pay for Housing in the Last Year: Patient unable to answer    Number of Times Moved in the Last Year: 0    Homeless in the Last Year: No  Utilities: Not At Risk (08/19/2024)   Epic    Threatened with loss of utilities: No  Health Literacy: Not on file    Allergies:  Allergies  Allergen Reactions   Clobetasol  Emul Foam W-Moistcr Other (See Comments)    Sore throat, trouble breathing, swallowing,feeling sleepy, more hungry, passing urine more often,and weight loss    Current Medications: Current Outpatient Medications  Medication Sig Dispense Refill   acetaminophen  (TYLENOL ) 325 MG tablet Take 325-650 mg by mouth daily as needed for mild pain (pain score 1-3) or moderate pain (pain score 4-6).     albuterol  (PROVENTIL ) (2.5 MG/3ML) 0.083% nebulizer solution Take 3 mLs (2.5 mg total) by nebulization every 6 (six) hours as needed for wheezing or shortness of breath. 360 mL 12   albuterol  (VENTOLIN  HFA) 108 (90 Base) MCG/ACT inhaler INHALE 2 PUFFS INTO THE LUNGS EVERY 6 HOURS AS NEEDED FOR WHEEZING OR SHORTNESS OF BREATH 6.7 g 3   amLODipine  (NORVASC ) 5 MG tablet Take 5 mg by mouth every morning.     atorvastatin (LIPITOR) 20 MG tablet Take 20 mg by mouth daily.     FLUoxetine  (PROZAC ) 20 MG capsule Take 1 capsule (20 mg total) by mouth daily. 30 capsule 2   Multiple Vitamin (MULTIVITAMIN WITH MINERALS) TABS tablet Take 1 tablet by mouth every morning.     ondansetron  (ZOFRAN -ODT) 4 MG disintegrating tablet Take 1 tablet (4 mg  total) by mouth every 8 (eight) hours as needed. 8 tablet 0   oxyCODONE -acetaminophen  (PERCOCET/ROXICET) 5-325 MG tablet Take 1 tablet by mouth every 6 (six) hours as needed for severe pain (pain score 7-10). 10  tablet 0   OXYGEN  Inhale 5 L into the lungs continuous.     STIOLTO RESPIMAT  2.5-2.5 MCG/ACT AERS INHALE 2 PUFFS INTO THE LUNGS DAILY 4 g 11   traZODone  (DESYREL ) 50 MG tablet Take 1 tablet (50 mg total) by mouth at bedtime. 90 tablet 0   Vibegron  (GEMTESA ) 75 MG TABS Take 1 tablet (75 mg total) by mouth at bedtime. 30 tablet 11   No current facility-administered medications for this visit.     Psychiatric Specialty Exam: Review of Systems  There were no vitals taken for this visit.There is no height or weight on file to calculate BMI.  General Appearance: Fairly Groomed  Eye Contact:  Good  Speech:  Clear and Coherent  Volume:  Normal  Mood:  Euthymic  Affect:  Congruent  Thought Process:  Coherent  Orientation:  Full (Time, Place, and Person)  Thought Content: Logical   Suicidal Thoughts:  No  Homicidal Thoughts:  No  Memory:  Immediate;   Fair  Judgement:  Fair  Insight:  Fair  Psychomotor Activity:  Normal  Concentration:  Concentration: Fair  Recall:  Fair  Fund of Knowledge: Fair  Language: Good  Akathisia:  NA    AIMS (if indicated): not done  Assets:  Communication Skills Desire for Improvement Financial Resources/Insurance  ADL's:  Intact  Cognition: WNL  Sleep:  Good   Metabolic Disorder Labs: Lab Results  Component Value Date   HGBA1C 5.6 08/19/2024   MPG 114.02 08/19/2024   No results found for: PROLACTIN Lab Results  Component Value Date   TRIG 63 08/19/2024   Lab Results  Component Value Date   TSH 0.707 08/26/2023    Therapeutic Level Labs: No results found for: LITHIUM No results found for: VALPROATE No results found for: CBMZ   Screenings: GAD-7    Flowsheet Row [facility]  Total GAD-7 Score 12   PHQ2-9    Flowsheet Row Office Visit from 04/08/2024 in [facility] Office Visit [facility]  PHQ-2 Total Score 0 2  PHQ-9 Total Score -- 10   Flowsheet Row ED from 10/18/2024 in [facility] Emergency Department at [facility] ED to Hosp-Admission (Discharged) from 08/18/2024 in [facility]  SURGICAL ED from 03/03/2024 in [facility] Emergency Department at [facility]  C-SSRS RISK CATEGORY No Risk No Risk No Risk    Collaboration of Care: Collaboration of Care: Medication Management AEB medication prescription and Other provider involved in patient's care AEB ED chart review  Patient/Guardian was advised Release of Information must be obtained prior to any record release in order to collaborate their care with an outside provider. Patient/Guardian was advised if they have not already done so to contact the registration department to sign all necessary forms in order for us  to release information regarding their care.   Consent: Patient/Guardian gives verbal consent for treatment and assignment of benefits for services provided during this visit. Patient/Guardian expressed understanding and agreed to proceed.    Arvella CHRISTELLA Finder, MD 12/20/2024, 2:01 PM   Virtual Visit via Video Note  I connected with [name] [name] on 12/20/2024 at 10:30 AM EST by a video enabled telemedicine application and verified that I am speaking with the correct person using two identifiers.  Location: Patient: Home Provider: Home Office   I discussed the limitations of evaluation and management by telemedicine  and the availability of in person appointments. The patient expressed understanding and agreed to proceed.   I discussed the assessment and treatment plan with the patient. The patient was provided an opportunity to ask questions and  all were answered. The patient agreed with the plan and demonstrated an understanding of the instructions.   The patient was advised to call back or seek an in-person evaluation if the symptoms worsen or if the condition fails to improve as anticipated.  I provided 15 minutes of non-face-to-face time during this encounter.   Arvella CHRISTELLA Finder, MD

## 2024-12-23 ENCOUNTER — Encounter (HOSPITAL_COMMUNITY): Admitting: Psychiatry

## 2024-12-23 ENCOUNTER — Encounter (HOSPITAL_COMMUNITY): Payer: Self-pay

## 2024-12-23 NOTE — Progress Notes (Signed)
 This encounter was created in error - please disregard.  Patient did not show up for the appointment.  Appointment reminders were sent to patient's phone and email.  Attempted to call the patient at 10:36 but received no response. Left a HIPAA compliant voicemail for patient to reschedule.

## 2024-12-27 ENCOUNTER — Telehealth: Payer: Self-pay | Admitting: *Deleted

## 2024-12-27 NOTE — Telephone Encounter (Signed)
 Lauraine Lites, NP has reviewed lung cancer screening CT and recommends a 6 month nodule follow up CT to follow up on lung nodule seen.

## 2024-12-27 NOTE — Telephone Encounter (Signed)
 Left voicemail for pt to call to discuss lung screening CT results.

## 2024-12-28 ENCOUNTER — Other Ambulatory Visit: Payer: Self-pay

## 2024-12-28 DIAGNOSIS — R911 Solitary pulmonary nodule: Secondary | ICD-10-CM

## 2024-12-28 DIAGNOSIS — Z87891 Personal history of nicotine dependence: Secondary | ICD-10-CM

## 2024-12-28 DIAGNOSIS — F17210 Nicotine dependence, cigarettes, uncomplicated: Secondary | ICD-10-CM

## 2024-12-28 DIAGNOSIS — Z122 Encounter for screening for malignant neoplasm of respiratory organs: Secondary | ICD-10-CM

## 2024-12-28 NOTE — Telephone Encounter (Signed)
 Spoke with patient and reviewed recent Lung CT results. She is in agreement to complete a 6 month follow up scan to evaluate nodule stability of a 9.8 mm nodule. Order placed and she has been scheduled for 06/20/2025. Results and plan to PCP.

## 2025-01-20 ENCOUNTER — Other Ambulatory Visit: Payer: Self-pay | Admitting: Internal Medicine

## 2025-06-21 ENCOUNTER — Other Ambulatory Visit
